# Patient Record
Sex: Male | Born: 1955
Health system: Southern US, Community
[De-identification: ages and names within clinical notes are randomized; demographics above are authoritative.]

## PROBLEM LIST (undated history)

## (undated) DIAGNOSIS — I5022 Chronic systolic (congestive) heart failure: Secondary | ICD-10-CM

## (undated) DIAGNOSIS — I422 Other hypertrophic cardiomyopathy: Secondary | ICD-10-CM

## (undated) DIAGNOSIS — I428 Other cardiomyopathies: Secondary | ICD-10-CM

## (undated) DIAGNOSIS — Z8042 Family history of malignant neoplasm of prostate: Secondary | ICD-10-CM

## (undated) DIAGNOSIS — I2510 Atherosclerotic heart disease of native coronary artery without angina pectoris: Secondary | ICD-10-CM

## (undated) DIAGNOSIS — Z803 Family history of malignant neoplasm of breast: Secondary | ICD-10-CM

## (undated) DIAGNOSIS — M109 Gout, unspecified: Secondary | ICD-10-CM

## (undated) DIAGNOSIS — M1990 Unspecified osteoarthritis, unspecified site: Secondary | ICD-10-CM

## (undated) DIAGNOSIS — I10 Essential (primary) hypertension: Secondary | ICD-10-CM

## (undated) HISTORY — DX: Chronic systolic (congestive) heart failure: I50.22

## (undated) HISTORY — DX: Family history of malignant neoplasm of breast: Z80.3

## (undated) HISTORY — DX: Other hypertrophic cardiomyopathy: I42.2

## (undated) HISTORY — DX: Atherosclerotic heart disease of native coronary artery without angina pectoris: I25.10

## (undated) HISTORY — PX: KNEE ARTHROSCOPY: SUR90

## (undated) HISTORY — DX: Other cardiomyopathies: I42.8

## (undated) HISTORY — DX: Essential (primary) hypertension: I10

## (undated) HISTORY — DX: Family history of malignant neoplasm of prostate: Z80.42

---

## 2001-05-09 ENCOUNTER — Ambulatory Visit (HOSPITAL_COMMUNITY): Admission: RE | Admit: 2001-05-09 | Discharge: 2001-05-09 | Payer: Self-pay | Admitting: Family Medicine

## 2001-05-09 ENCOUNTER — Encounter: Admission: RE | Admit: 2001-05-09 | Discharge: 2001-05-09 | Payer: Self-pay | Admitting: Family Medicine

## 2001-05-12 ENCOUNTER — Encounter: Admission: RE | Admit: 2001-05-12 | Discharge: 2001-05-12 | Payer: Self-pay | Admitting: Family Medicine

## 2001-05-23 ENCOUNTER — Encounter: Admission: RE | Admit: 2001-05-23 | Discharge: 2001-05-23 | Payer: Self-pay | Admitting: Sports Medicine

## 2001-06-09 ENCOUNTER — Ambulatory Visit (HOSPITAL_COMMUNITY): Admission: RE | Admit: 2001-06-09 | Discharge: 2001-06-09 | Payer: Self-pay | Admitting: Sports Medicine

## 2002-01-29 ENCOUNTER — Inpatient Hospital Stay (HOSPITAL_COMMUNITY): Admission: EM | Admit: 2002-01-29 | Discharge: 2002-02-01 | Payer: Self-pay | Admitting: Emergency Medicine

## 2002-01-30 ENCOUNTER — Encounter: Payer: Self-pay | Admitting: Family Medicine

## 2002-02-06 ENCOUNTER — Encounter: Admission: RE | Admit: 2002-02-06 | Discharge: 2002-02-06 | Payer: Self-pay | Admitting: Family Medicine

## 2002-12-21 ENCOUNTER — Encounter: Admission: RE | Admit: 2002-12-21 | Discharge: 2002-12-21 | Payer: Self-pay | Admitting: Family Medicine

## 2003-01-04 ENCOUNTER — Encounter: Admission: RE | Admit: 2003-01-04 | Discharge: 2003-01-04 | Payer: Self-pay | Admitting: Family Medicine

## 2003-01-08 ENCOUNTER — Encounter: Admission: RE | Admit: 2003-01-08 | Discharge: 2003-01-08 | Payer: Self-pay | Admitting: Family Medicine

## 2003-01-09 ENCOUNTER — Encounter: Admission: RE | Admit: 2003-01-09 | Discharge: 2003-01-09 | Payer: Self-pay | Admitting: Family Medicine

## 2003-01-28 ENCOUNTER — Encounter: Admission: RE | Admit: 2003-01-28 | Discharge: 2003-01-28 | Payer: Self-pay | Admitting: Sports Medicine

## 2003-12-30 ENCOUNTER — Emergency Department (HOSPITAL_COMMUNITY): Admission: EM | Admit: 2003-12-30 | Discharge: 2003-12-31 | Payer: Self-pay | Admitting: Emergency Medicine

## 2003-12-31 ENCOUNTER — Encounter: Admission: RE | Admit: 2003-12-31 | Discharge: 2003-12-31 | Payer: Self-pay | Admitting: Family Medicine

## 2003-12-31 ENCOUNTER — Encounter: Admission: RE | Admit: 2003-12-31 | Discharge: 2003-12-31 | Payer: Self-pay | Admitting: Sports Medicine

## 2004-12-02 ENCOUNTER — Ambulatory Visit: Payer: Self-pay | Admitting: Family Medicine

## 2007-01-26 DIAGNOSIS — I10 Essential (primary) hypertension: Secondary | ICD-10-CM | POA: Insufficient documentation

## 2007-01-26 DIAGNOSIS — K922 Gastrointestinal hemorrhage, unspecified: Secondary | ICD-10-CM | POA: Insufficient documentation

## 2009-07-26 ENCOUNTER — Encounter: Payer: Self-pay | Admitting: Emergency Medicine

## 2009-07-27 ENCOUNTER — Ambulatory Visit: Payer: Self-pay | Admitting: Cardiology

## 2009-07-27 ENCOUNTER — Inpatient Hospital Stay (HOSPITAL_COMMUNITY): Admission: EM | Admit: 2009-07-27 | Discharge: 2009-07-29 | Payer: Self-pay | Admitting: Cardiology

## 2009-07-28 ENCOUNTER — Encounter: Payer: Self-pay | Admitting: Cardiology

## 2009-08-01 ENCOUNTER — Ambulatory Visit: Payer: Self-pay | Admitting: Cardiology

## 2009-08-04 LAB — CONVERTED CEMR LAB
BUN: 37 mg/dL — ABNORMAL HIGH (ref 6–23)
CO2: 30 meq/L (ref 19–32)
Calcium: 9.5 mg/dL (ref 8.4–10.5)
Chloride: 105 meq/L (ref 96–112)
Creatinine, Ser: 1.5 mg/dL (ref 0.4–1.5)
GFR calc non Af Amer: 62.69 mL/min (ref >60)
Glucose, Bld: 86 mg/dL (ref 70–99)
Potassium: 4.4 meq/L (ref 3.5–5.1)
Sodium: 141 meq/L (ref 135–145)

## 2009-08-20 ENCOUNTER — Ambulatory Visit: Payer: Self-pay | Admitting: Cardiology

## 2009-08-20 DIAGNOSIS — I428 Other cardiomyopathies: Secondary | ICD-10-CM | POA: Insufficient documentation

## 2009-08-25 ENCOUNTER — Encounter: Payer: Self-pay | Admitting: Cardiology

## 2009-09-11 ENCOUNTER — Telehealth: Payer: Self-pay | Admitting: Cardiology

## 2009-09-11 ENCOUNTER — Telehealth (INDEPENDENT_AMBULATORY_CARE_PROVIDER_SITE_OTHER): Payer: Self-pay | Admitting: *Deleted

## 2010-01-22 ENCOUNTER — Encounter (INDEPENDENT_AMBULATORY_CARE_PROVIDER_SITE_OTHER): Payer: Self-pay | Admitting: *Deleted

## 2010-04-14 ENCOUNTER — Ambulatory Visit: Payer: Self-pay | Admitting: Cardiology

## 2010-04-24 ENCOUNTER — Ambulatory Visit: Payer: Self-pay | Admitting: Cardiology

## 2010-04-27 LAB — CONVERTED CEMR LAB
BUN: 32 mg/dL — ABNORMAL HIGH (ref 6–23)
CO2: 30 meq/L (ref 19–32)
Calcium: 9.5 mg/dL (ref 8.4–10.5)
Chloride: 102 meq/L (ref 96–112)
Creatinine, Ser: 1.5 mg/dL (ref 0.4–1.5)
GFR calc non Af Amer: 50.5 mL/min (ref >60)
Glucose, Bld: 108 mg/dL — ABNORMAL HIGH (ref 70–99)
Potassium: 3.7 meq/L (ref 3.5–5.1)
Pro B Natriuretic peptide (BNP): 87.4 pg/mL (ref 0.0–100.0)
Sodium: 140 meq/L (ref 135–145)

## 2010-06-15 ENCOUNTER — Ambulatory Visit: Payer: Self-pay | Admitting: Cardiology

## 2010-06-19 ENCOUNTER — Telehealth: Payer: Self-pay | Admitting: Cardiology

## 2010-10-14 ENCOUNTER — Ambulatory Visit: Payer: Self-pay | Admitting: Cardiology

## 2010-10-14 DIAGNOSIS — E669 Obesity, unspecified: Secondary | ICD-10-CM | POA: Insufficient documentation

## 2010-12-29 NOTE — Assessment & Plan Note (Signed)
Summary: per check out/sf   Visit Type:  Follow-up Primary Provider:  None  CC:  Cardiomyopathy.  History of Present Illness: The patient presents for followup his known cardiomyopathy. At the last appointment I increased his carvedilol to 37.5 mg b.i.d. He did well with this. He denies any lightheadedness, presyncope or syncope. He has no new shortness of breath, PND or orthopnea. He has no chest discomfort, neck or arm discomfort. He did exercise a little since I last saw him but he has not lost any weight as I directed.  Current Medications (verified): 1)  Aspirin 81 Mg  Tabs (Aspirin) .... Daily 2)  Furosemide 40 Mg Tabs (Furosemide) .... Daily 3)  Norvasc 10 Mg Tabs (Amlodipine Besylate) .Marland Kitchen.. 1 By Mouth Daily 4)  Enalapril Maleate 10 Mg Tabs (Enalapril Maleate) .... One Two Times A Day 5)  Carvedilol 25 Mg Tabs (Carvedilol) .Marland Kitchen.. 1 Tab Twice Daily 6)  Carvedilol 12.5 Mg Tabs (Carvedilol) .... One Tablet Two Times A Day With 25 Mg Tablets Two Times A Day For A Total of 37.5 Mg Bid  Allergies (verified): No Known Drug Allergies  Past History:  Past Medical History: Reviewed history from 08/18/2009 and no changes required. Hypertensive urgency  NQWMI Non Obstructive CAD Cardiomyopathy (EF 25%)  Past Surgical History: Reviewed history from 01/26/2007 and no changes required. CT head negative 3/03 - 02/01/2002  Review of Systems       As stated in the HPI and negative for all other systems.   Vital Signs:  Patient profile:   55 year old male Height:      72 inches Weight:      263 pounds BMI:     35.80 Pulse rate:   67 / minute Resp:     18 per minute BP sitting:   138 / 88  (right arm)  Vitals Entered By: Marrion Coy, CNA (October 14, 2010 10:02 AM)  Physical Exam  General:  Well developed, well nourished, in no acute distress. Head:  normocephalic and atraumatic Eyes:  PERRLA/EOM intact; conjunctiva and lids normal. Mouth:  Teeth, gums and palate normal. Oral  mucosa normal. Neck:  Neck supple, no JVD. No masses, thyromegaly or abnormal cervical nodes. Chest Wall:  no deformities or breast masses noted Lungs:  Clear bilaterally to auscultation and percussion. Heart:  Non-displaced PMI, chest non-tender; regular rate and rhythm, S1, S2 without murmurs, rubs or gallops. Carotid upstroke normal, no bruit. Normal abdominal aortic size, no bruits. Femorals normal pulses, no bruits. Pedals normal pulses. No edema, no varicosities. Abdomen:  Bowel sounds positive; abdomen soft and non-tender without masses, organomegaly, or hernias noted. No hepatosplenomegaly, obese Msk:  Back normal, normal gait. Muscle strength and tone normal. Extremities:  No clubbing or cyanosis. Neurologic:  Alert and oriented x 3. Skin:  Intact without lesions or rashes. Cervical Nodes:  no significant adenopathy Inguinal Nodes:  no significant adenopathy Psych:  Normal affect.   EKG  Procedure date:  10/14/2010  Findings:      Sinus rhythm, left ventricular hypertrophy by voltage criteria  Impression & Recommendations:  Problem # 1:  CARDIOMYOPATHY (ICD-425.4)  Today I will increase his carvedilol to 50 mg b.i.d. He will continue the other meds as listed. When he comes back in 4 months we will repeat an echocardiogram.  Orders: Echocardiogram (Echo)  Problem # 2:  HYPERTENSION, BENIGN SYSTEMIC (ICD-401.1)  His blood pressure is now controlled in the context of treating his cardiomyopathy.  His updated medication list for  this problem includes:    Aspirin 81 Mg Tabs (Aspirin) .Marland Kitchen... Daily    Furosemide 40 Mg Tabs (Furosemide) .Marland Kitchen... Daily    Norvasc 10 Mg Tabs (Amlodipine besylate) .Marland Kitchen... 1 by mouth daily    Enalapril Maleate 10 Mg Tabs (Enalapril maleate) ..... One two times a day    Carvedilol 25 Mg Tabs (Carvedilol) .Marland Kitchen... 1 tab twice daily    Carvedilol 12.5 Mg Tabs (Carvedilol) ..... One tablet two times a day with 25 mg tablets two times a day for a total of  37.5 mg bid    Carvedilol 25 Mg Tabs (Carvedilol) .Marland Kitchen... Take 2 tablets twice a day  Problem # 3:  OBESITY, UNSPECIFIED (ICD-278.00) We had another discussion about weight loss with diet and exercise.  Patient Instructions: 1)  Your physician recommends that you schedule a follow-up appointment in: 4 MONTHS WITH DR. Amairani Shuey 2)  Your physician has recommended you make the following change in your medication:  3)  Your physician has requested that you have an echocardiogram.  Echocardiography is a painless test that uses sound waves to create images of your heart. It provides your doctor with information about the size and shape of your heart and how well your heart's chambers and valves are working.  This procedure takes approximately one hour. There are no restrictions for this procedure.  ECHO SAME DAY AS APPT WITH DR. Isiaha Greenup 4)  Your physician encouraged you to lose weight for better health. Prescriptions: CARVEDILOL 25 MG TABS (CARVEDILOL) Take 2 tablets twice a day  #120 x 6   Entered by:   Lisabeth Devoid RN   Authorized by:   Rollene Rotunda, MD, [facility]   Signed by:   Lisabeth Devoid RN on 10/14/2010   Method used:   Electronically to        [id number] (retail)       [address] [place], [place]  [address]       Ph: [phone]       Fax: [phone]   RxID:   [id number]  I have reviewed and approved all prescriptions at the time of this visit. Rollene Rotunda, MD, [facility]  October 14, 2010 12:09 PM

## 2010-12-29 NOTE — Assessment & Plan Note (Signed)
Summary: EPH/ GD   Visit Type:  Follow-up  CC:  Cardiomyopathy.  History of Present Illness: The patient presents for hospital followup after treatment of a cardiomyopathy. He presented with heart failure. He was found to have an ejection fraction of 20-25% with global hypokinesis. He subsequently had a cardiac catheterization demonstrating only minimal plaque. It was felt that his heart disease was related to hypertension. This has been managed with the meds as listed.  He says that since discharge he has felt much better. He has had none of the dyspnea that he was having previously. He is not describing PND or orthopnea. He is having some fleeting atypical chest discomfort but no chest pressure, neck or arm discomfort. He has had no swelling or weight gain.  Current Medications (verified): 1)  Aspirin 81 Mg  Tabs (Aspirin) .... Daily 2)  Furosemide 40 Mg Tabs (Furosemide) .... Daily 3)  Norvasc 10 Mg Tabs (Amlodipine Besylate) .... Daily 4)  Labetalol Hcl 200 Mg Tabs (Labetalol Hcl) .... 2 Three Times A Day 5)  Enalapril Maleate 5 Mg Tabs (Enalapril Maleate) .... Two Times A Day  Allergies (verified): No Known Drug Allergies  Past History:  Past Medical History: Reviewed history from 08/18/2009 and no changes required. Hypertensive urgency  NQWMI Non Obstructive CAD Cardiomyopathy (EF 25%)  Past Surgical History: Reviewed history from 01/26/2007 and no changes required. CT head negative 3/03 - 02/01/2002  Review of Systems       As stated in the HPI and negative for all other systems.   Vital Signs:  Patient profile:   55 year old male Height:      72 inches Weight:      265 pounds BMI:     36.07 Pulse rate:   72 / minute Resp:     18 per minute BP sitting:   138 / 80  (right arm)  Vitals Entered By: Marrion Coy, CNA (August 20, 2009 10:17 AM)  Nutrition Counseling: Patient's BMI is greater than 25 and therefore counseled on weight management  options.  Physical Exam  General:  Well developed, well nourished, in no acute distress. Head:  normocephalic and atraumatic Eyes:  PERRLA/EOM intact; conjunctiva and lids normal. Mouth:  Teeth, gums and palate normal. Oral mucosa normal. Neck:  Neck supple, no JVD. No masses, thyromegaly or abnormal cervical nodes. Chest Wall:  no deformities or breast masses noted Lungs:  Clear bilaterally to auscultation and percussion. Abdomen:  Bowel sounds positive; abdomen soft and non-tender without masses, organomegaly, or hernias noted. No hepatosplenomegaly, obese Msk:  Back normal, normal gait. Muscle strength and tone normal. Extremities:  No clubbing or cyanosis. Neurologic:  Alert and oriented x 3. Skin:  Intact without lesions or rashes. Psych:  Normal affect.   Detailed Cardiovascular Exam  Neck    Carotids: Carotids full and equal bilaterally without bruits.      Neck Veins: Normal, no JVD.    Heart    Inspection: no deformities or lifts noted.      Palpation: normal PMI with no thrills palpable.      Auscultation: regular rate and rhythm, S1, S2 without murmurs, rubs, gallops, or clicks.    Vascular    Abdominal Aorta: no palpable masses, pulsations, or audible bruits.      Femoral Pulses: normal femoral pulses bilaterally.      Pedal Pulses: normal pedal pulses bilaterally.      Radial Pulses: normal radial pulses bilaterally.      Peripheral Circulation:  no clubbing, cyanosis, or edema noted with normal capillary refill.     EKG  Procedure date:  08/20/2009  Findings:      normal sinus rhythm, rate 64, right axis deviation, no old EKGs for comparison, mild interventricular conduction delay with repolarization changes.  Impression & Recommendations:  Problem # 1:  CARDIOMYOPATHY (ICD-425.4)  The patient has a nonischemic cardiomyopathy. This is related to his hypertension. He seems to have class I symptoms at this point. We will continue to manage this in the  context of treating his hypertension.  The following medications were removed from the medication list:    Labetalol Hcl 200 Mg Tabs (Labetalol hcl) .Marland Kitchen... 2 three times a day His updated medication list for this problem includes:    Aspirin 81 Mg Tabs (Aspirin) .Marland Kitchen... Daily    Furosemide 40 Mg Tabs (Furosemide) .Marland Kitchen... Daily    Norvasc 10 Mg Tabs (Amlodipine besylate) .Marland Kitchen... Daily    Enalapril Maleate 5 Mg Tabs (Enalapril maleate) .Marland Kitchen..Marland Kitchen Two times a day    Carvedilol 25 Mg Tabs (Carvedilol) .Marland Kitchen... 1 tab twice daily  Problem # 2:  HYPERTENSION, BENIGN SYSTEMIC (ICD-401.1) His blood pressure is well controlled but he occasionally forgets his labetalol. I will switch him to carvedilol 25 mg b.i.d. and continue the other medications as listed. I may titrate his ACE inhibitor further over time. Also his target beta blocker dose is 50 b.i.d. with his size.  Other Orders: EKG w/ Interpretation (93000)  Patient Instructions: 1)  Your physician recommends that you schedule a follow-up appointment in: 3 MONTHS 2)  Your physician has recommended you make the following change in your medication: START CARVEDILOL 25 MG TWICE DAILY AFTER LABATEOL RUNS OUT

## 2010-12-29 NOTE — Progress Notes (Signed)
Summary: refill meds  Phone Note Refill Request Call back at Home Phone [phone] Message from:  Patient on June 19, 2010 4:34 PM  Refills Requested: Medication #1:  CARVEDILOL 12.5 MG TABS one tablet two times a day with 25 mg tablets two times a day for a total of 37.5 mg bid. [facility] [address] .   Method Requested: Fax to Local Pharmacy Initial call taken by: Lorne Skeens,  June 19, 2010 4:35 PM  Follow-up for Phone Call        Rx sent into pharmacy. Pt notified. Marrion Coy, CNA  June 22, 2010 9:27 AM  Follow-up by: Marrion Coy, CNA,  June 22, 2010 9:28 AM    Prescriptions: CARVEDILOL 12.5 MG TABS (CARVEDILOL) one tablet two times a day with 25 mg tablets two times a day for a total of 37.5 mg bid  #60 x 6   Entered by:   Marrion Coy, CNA   Authorized by:   Rollene Rotunda, MD, [facility]   Signed by:   Marrion Coy, CNA on 06/22/2010   Method used:   Electronically to        [id number] (retail)       [address] [place], [place]  [address]       Ph: [phone]       Fax: [phone]   RxID:   [id number]

## 2010-12-29 NOTE — Assessment & Plan Note (Signed)
Summary: per check out/sf   Visit Type:  Follow-up Primary Provider:  None  CC:  Cardiomyopathy and HTN.  History of Present Illness: The patient presents for evaluation of the above. Since I last saw him he has had no new cardiovascular complaints. He tolerated increase of his enalapril. He has no shortness of breath, PND or orthopnea. He has no chest pressure, neck or arm discomfort. He is not having any palpitations, presyncope or syncope.  Current Medications (verified): 1)  Aspirin 81 Mg  Tabs (Aspirin) .... Daily 2)  Furosemide 40 Mg Tabs (Furosemide) .... Daily 3)  Norvasc 10 Mg Tabs (Amlodipine Besylate) .Marland Kitchen.. 1 By Mouth Daily 4)  Enalapril Maleate 10 Mg Tabs (Enalapril Maleate) .... One Two Times A Day 5)  Carvedilol 25 Mg Tabs (Carvedilol) .Marland Kitchen.. 1 Tab Twice Daily  Allergies (verified): No Known Drug Allergies  Past History:  Past Medical History: Reviewed history from 08/18/2009 and no changes required. Hypertensive urgency  NQWMI Non Obstructive CAD Cardiomyopathy (EF 25%)  Review of Systems       As stated in the HPI and negative for all other systems.   Vital Signs:  Patient profile:   55 year old male Height:      72 inches Weight:      260 pounds BMI:     35.39 Pulse rate:   73 / minute Resp:     18 per minute BP sitting:   157 / 95  (left arm)  Vitals Entered By: Marrion Coy, CNA (June 15, 2010 9:35 AM)  Physical Exam  General:  Well developed, well nourished, in no acute distress. Head:  normocephalic and atraumatic Eyes:  PERRLA/EOM intact; conjunctiva and lids normal. Mouth:  Teeth, gums and palate normal. Oral mucosa normal. Neck:  Neck supple, no JVD. No masses, thyromegaly or abnormal cervical nodes. Chest Wall:  no deformities or breast masses noted Lungs:  Clear bilaterally to auscultation and percussion. Heart:  Non-displaced PMI, chest non-tender; regular rate and rhythm, S1, S2 without murmurs, rubs or gallops. Carotid upstroke normal,  no bruit. Normal abdominal aortic size, no bruits. Femorals normal pulses, no bruits. Pedals normal pulses. No edema, no varicosities. Abdomen:  Bowel sounds positive; abdomen soft and non-tender without masses, organomegaly, or hernias noted. No hepatosplenomegaly. Msk:  Back normal, normal gait. Muscle strength and tone normal. Pulses:  pulses normal in all 4 extremities Extremities:  No clubbing or cyanosis. Neurologic:  Alert and oriented x 3. Skin:  Intact without lesions or rashes. Cervical Nodes:  no significant adenopathy Axillary Nodes:  no significant adenopathy Inguinal Nodes:  no significant adenopathy Psych:  Normal affect.   Impression & Recommendations:  Problem # 1:  CARDIOMYOPATHY (ICD-425.4) The patient tolerated ACE inhibitor increased. Today all increase his carvedilol to 37.5 mg b.i.d. with a goal of 50 b.i.d.  Problem # 2:  HYPERTENSION, BENIGN SYSTEMIC (ICD-401.1) His blood pressure will be treated in the context of his cardiomyopathy management.  Patient Instructions: 1)  Your physician recommends that you schedule a follow-up appointment in: 4 to 6 weeks with Dr Antoine Poche 2)  Your physician has recommended you make the following change in your medication: Add Carvedilol 12.5 mg one twice a day for a total of 37.5 mg twice a day 3)  You have been diagnosed with Congestive Heart Failure or CHF.  CHF is a condition in which a problem with the structure or function of the heart impairs its ability to supply sufficient blood flow to meet the  body's needs.  For further information please visit www.cardiosmart.org for detailed information on CHF. 4)  Your physician recommends that you weigh, daily, at the same time every day, and in the same amount of clothing.  Please record your daily weights on the handout provided and bring it to your next appointment. Prescriptions: CARVEDILOL 12.5 MG TABS (CARVEDILOL) one tablet two times a day with 25 mg tablets two times a day for  a total of 37.5 mg bid  #60 x 2   Entered by:   Charolotte Capuchin, RN   Authorized by:   Rollene Rotunda, MD, [facility] [place] [facility]   Signed by:   Charolotte Capuchin, RN on 06/15/2010   Method used:   Electronically to        [facility] [id number] (retail)       [address] [place], [place]  [address]       Ph: [phone]       Fax: [phone]   RxID:   [id number]

## 2010-12-29 NOTE — Letter (Signed)
Summary: Appointment - Missed  [facility]     [place], [place]    Phone:   Fax:      January 22, 2010 MRN: [id number]   [facility] [address] [place], [place]  [address]   Dear Craig Prince,  Our records indicate you missed your appointment on 01-22-2010  with  Dr.   Luray Lions   It is very important that we reach you to reschedule this appointment. We look forward to participating in your health care needs. Please contact us at the number listed above at your earliest convenience to reschedule this appointment.     Sincerely,      Lorne Skeens  [facility] Scheduling Team

## 2010-12-29 NOTE — Progress Notes (Signed)
Summary: WANTS A LETTER FOR HIS ATTY  Phone Note Call from Patient Call back at Home Phone [phone]   Caller: Patient Summary of Call: pt have question about a med Initial call taken by: Judie Grieve,  September 11, 2009 11:17 AM  Follow-up for Phone Call        Called patient and left message on machine Pam Fleming-Hayes,RN  PT WALKED IN STATING HE NEEDS A NOTE FROM DR [facility] FOR HIS ATTY.  WILL HAVE DR [facility] REVIEW INFORMATION AND DICTATE A LETTER IF POSSIBLE.  PAM FLEMING-HAYES,RN  Additional Follow-up for Phone Call Additional follow up Details #1::        I cannot write a letter stating that his NQWMI or HTN contributed to his traffic ticket. per Dr Rollene Rotunda.  pt aware Additional Follow-up by: Charolotte Capuchin, RN,  September 19, 2009 12:46 PM

## 2010-12-29 NOTE — Assessment & Plan Note (Signed)
Summary: PER WALK IN/SAF   Visit Type:  Follow-up Primary Provider:  None  CC:  HTN/Cardiomyopathy.  History of Present Illness: The patient presents for followup. His only complaint is fatigue. However, he works at night which may explain part of that. He is not describing shortness of breath, PND or orthopnea. He is not describing palpitations, presyncope or syncope. He is not having any chest pressure, neck or arm discomfort. He has had no weight gain or swelling. He is not avoiding salt or following any particular diet. He doesn't exercise as much as I would help.  Current Medications (verified): 1)  Aspirin 81 Mg  Tabs (Aspirin) .... Daily 2)  Furosemide 40 Mg Tabs (Furosemide) .... Daily 3)  Norvasc 10 Mg Tabs (Amlodipine Besylate) .... Daily 4)  Enalapril Maleate 5 Mg Tabs (Enalapril Maleate) .... Two Times A Day 5)  Carvedilol 25 Mg Tabs (Carvedilol) .Marland Kitchen.. 1 Tab Twice Daily  Allergies (verified): No Known Drug Allergies  Past History:  Past Medical History: Reviewed history from 08/18/2009 and no changes required. Hypertensive urgency  NQWMI Non Obstructive CAD Cardiomyopathy (EF 25%)  Review of Systems       As stated in the HPI and negative for all other systems.   Vital Signs:  Patient profile:   55 year old male Height:      72 inches Weight:      264 pounds BMI:     35.93 Pulse rate:   71 / minute Resp:     16 per minute BP sitting:   158 / 108  (right arm)  Vitals Entered By: Marrion Coy, CNA (Apr 14, 2010 10:17 AM)  Physical Exam  General:  Well developed, well nourished, in no acute distress. Head:  normocephalic and atraumatic Eyes:  PERRLA/EOM intact; conjunctiva and lids normal. Mouth:  Teeth, gums and palate normal. Oral mucosa normal. Neck:  Neck supple, no JVD. No masses, thyromegaly or abnormal cervical nodes. Chest Wall:  no deformities or breast masses noted Lungs:  Clear bilaterally to auscultation and percussion. Heart:  Sand S2  within normal limits, no S3, positive S4, no clicks, no rubs, no murmurs. Abdomen:  Bowel sounds positive; abdomen soft and non-tender without masses, organomegaly, or hernias noted. No hepatosplenomegaly. Msk:  Back normal, normal gait. Muscle strength and tone normal. Pulses:  pulses normal in all 4 extremities Extremities:  No clubbing or cyanosis. Neurologic:  Alert and oriented x 3. Skin:  Intact without lesions or rashes. Psych:  Normal affect.   EKG  Procedure date:  04/14/2010  Findings:      sus rhythm, rate 73, left axis deviation, left ventricular hypertrophy, lateral T-wave inversions consistent with repolarization  Impression & Recommendations:  Problem # 1:  CARDIOMYOPATHY (ICD-425.4) I educated him about salt restriction. Today I will titrate his enalapril to 10 mg b.i.d. He will come back in 2 weeks for a basic metabolic profile. Orders: EKG w/ Interpretation (93000)  Problem # 2:  HYPERTENSION, BENIGN SYSTEMIC (ICD-401.1) This will be managed in the context of managing his cardiomyopathy. Orders: EKG w/ Interpretation (93000)  Patient Instructions: 1)  Your physician recommends that you schedule a follow-up appointment in: 2 months with Dr Antoine Poche 2)  Your physician recommends that you return for lab work in:  in 10 days BNP and BMP 401.1 428.22 v58.69 3)  Your physician has recommended you make the following change in your medication:  Increase Enalapril to 10mg  twice a day. 4)  You have been diagnosed with  Congestive Heart Failure or CHF.  CHF is a condition in which a problem with the structure or function of the heart impairs its ability to supply sufficient blood flow to meet the body's needs.  For further information please visit www.cardiosmart.org for detailed information on CHF. 5)  Your physician recommends that you weigh, daily, at the same time every day, and in the same amount of clothing.  Please record your daily weights on the handout provided and  bring it to your next appointment. Prescriptions: ENALAPRIL MALEATE 10 MG TABS (ENALAPRIL MALEATE) one two times a day  #60 x 11   Entered by:   Charolotte Capuchin, RN   Authorized by:   Rollene Rotunda, MD, [facility]   Signed by:   Charolotte Capuchin, RN on 04/14/2010   Method used:   Electronically to        [facility] [phone]* (retail)       [address] [place], [place]  [address]       Ph: [phone]       Fax: [phone]   RxID:   [id number]

## 2010-12-29 NOTE — Progress Notes (Signed)
  Walk in Patient Form Recieved "patient needs note form Dr.Hochrein"  will forward to PAm  [facility]  September 11, 2009 1:45 PM

## 2011-01-01 NOTE — Letter (Signed)
Summary: Ticket Violation/Receipt Note  Ticket Violation/Receipt Note   Imported By: Roderic Ovens 10/29/2009 15:50:19  _____________________________________________________________________  External Attachment:    Type:   Image     Comment:   External Document

## 2011-02-11 ENCOUNTER — Ambulatory Visit: Payer: Self-pay | Admitting: Cardiology

## 2011-02-11 ENCOUNTER — Other Ambulatory Visit (HOSPITAL_COMMUNITY): Payer: Self-pay

## 2011-03-06 LAB — CBC
HCT: 40.6 % (ref 39.0–52.0)
HCT: 40.6 % (ref 39.0–52.0)
HCT: 41.9 % (ref 39.0–52.0)
HCT: 41.4 % (ref 39.0–52.0)
Hemoglobin: 12.9 g/dL — ABNORMAL LOW (ref 13.0–17.0)
Hemoglobin: 13 g/dL (ref 13.0–17.0)
Hemoglobin: 13.3 g/dL (ref 13.0–17.0)
Hemoglobin: 13.1 g/dL (ref 13.0–17.0)
MCHC: 31.6 g/dL (ref 30.0–36.0)
MCHC: 31.7 g/dL (ref 30.0–36.0)
MCHC: 32 g/dL (ref 30.0–36.0)
MCHC: 31.7 g/dL (ref 30.0–36.0)
MCV: 70.8 fL — ABNORMAL LOW (ref 78.0–100.0)
MCV: 71.3 fL — ABNORMAL LOW (ref 78.0–100.0)
MCV: 71.4 fL — ABNORMAL LOW (ref 78.0–100.0)
MCV: 71.1 fL — ABNORMAL LOW (ref 78.0–100.0)
Platelets: 206 10*3/uL (ref 150–400)
Platelets: 219 10*3/uL (ref 150–400)
Platelets: 222 10*3/uL (ref 150–400)
Platelets: 211 10*3/uL (ref 150–400)
RBC: 5.69 MIL/uL (ref 4.22–5.81)
RBC: 5.73 MIL/uL (ref 4.22–5.81)
RBC: 5.87 MIL/uL — ABNORMAL HIGH (ref 4.22–5.81)
RBC: 5.82 MIL/uL — ABNORMAL HIGH (ref 4.22–5.81)
RDW: 17.4 % — ABNORMAL HIGH (ref 11.5–15.5)
RDW: 17.8 % — ABNORMAL HIGH (ref 11.5–15.5)
RDW: 18.4 % — ABNORMAL HIGH (ref 11.5–15.5)
RDW: 18.3 % — ABNORMAL HIGH (ref 11.5–15.5)
WBC: 8.7 10*3/uL (ref 4.0–10.5)
WBC: 9.1 10*3/uL (ref 4.0–10.5)
WBC: 9.3 10*3/uL (ref 4.0–10.5)
WBC: 8.4 10*3/uL (ref 4.0–10.5)

## 2011-03-06 LAB — POCT I-STAT 3, ART BLOOD GAS (G3+)
Acid-Base Excess: 2 mmol/L (ref 0.0–2.0)
Acid-Base Excess: 4 mmol/L — ABNORMAL HIGH (ref 0.0–2.0)
Bicarbonate: 28.1 mEq/L — ABNORMAL HIGH (ref 20.0–24.0)
Bicarbonate: 28.6 mEq/L — ABNORMAL HIGH (ref 20.0–24.0)
O2 Saturation: 59 %
O2 Saturation: 95 %
TCO2: 30 mmol/L (ref 0–100)
TCO2: 30 mmol/L (ref 0–100)
pCO2 arterial: 41.7 mmHg (ref 35.0–45.0)
pCO2 arterial: 47 mmHg — ABNORMAL HIGH (ref 35.0–45.0)
pH, Arterial: 7.385 (ref 7.350–7.450)
pH, Arterial: 7.444 (ref 7.350–7.450)
pO2, Arterial: 31 mmHg — CL (ref 80.0–100.0)
pO2, Arterial: 72 mmHg — ABNORMAL LOW (ref 80.0–100.0)

## 2011-03-06 LAB — DIFFERENTIAL
Basophils Absolute: 0 10*3/uL (ref 0.0–0.1)
Basophils Relative: 1 % (ref 0–1)
Eosinophils Absolute: 0.2 10*3/uL (ref 0.0–0.7)
Eosinophils Relative: 2 % (ref 0–5)
Lymphocytes Relative: 18 % (ref 12–46)
Lymphs Abs: 1.5 10*3/uL (ref 0.7–4.0)
Monocytes Absolute: 0.5 10*3/uL (ref 0.1–1.0)
Monocytes Relative: 6 % (ref 3–12)
Neutro Abs: 6.1 10*3/uL (ref 1.7–7.7)
Neutrophils Relative %: 74 % (ref 43–77)

## 2011-03-06 LAB — CARDIAC PANEL(CRET KIN+CKTOT+MB+TROPI)
CK, MB: 12.5 ng/mL — ABNORMAL HIGH (ref 0.3–4.0)
CK, MB: 16.5 ng/mL — ABNORMAL HIGH (ref 0.3–4.0)
CK, MB: 18.9 ng/mL — ABNORMAL HIGH (ref 0.3–4.0)
Relative Index: 3.7 — ABNORMAL HIGH (ref 0.0–2.5)
Relative Index: 4 — ABNORMAL HIGH (ref 0.0–2.5)
Relative Index: 4.2 — ABNORMAL HIGH (ref 0.0–2.5)
Total CK: 340 U/L — ABNORMAL HIGH (ref 7–232)
Total CK: 411 U/L — ABNORMAL HIGH (ref 7–232)
Total CK: 446 U/L — ABNORMAL HIGH (ref 7–232)
Troponin I: 0.09 ng/mL — ABNORMAL HIGH (ref 0.00–0.06)
Troponin I: 0.13 ng/mL — ABNORMAL HIGH (ref 0.00–0.06)
Troponin I: 0.17 ng/mL — ABNORMAL HIGH (ref 0.00–0.06)

## 2011-03-06 LAB — URINALYSIS, ROUTINE W REFLEX MICROSCOPIC
Bilirubin Urine: NEGATIVE
Glucose, UA: NEGATIVE mg/dL
Ketones, ur: NEGATIVE mg/dL
Leukocytes, UA: NEGATIVE
Nitrite: NEGATIVE
Protein, ur: >300 mg/dL — AB
Specific Gravity, Urine: 1.025 (ref 1.005–1.030)
Urobilinogen, UA: 0.2 mg/dL (ref 0.0–1.0)
pH: 5.5 (ref 5.0–8.0)

## 2011-03-06 LAB — PLATELET COUNT: Platelets: 233 10*3/uL (ref 150–400)

## 2011-03-06 LAB — BASIC METABOLIC PANEL
BUN: 21 mg/dL (ref 6–23)
BUN: 21 mg/dL (ref 6–23)
BUN: 21 mg/dL (ref 6–23)
BUN: 21 mg/dL (ref 6–23)
BUN: 21 mg/dL (ref 6–23)
CO2: 29 mEq/L (ref 19–32)
CO2: 30 mEq/L (ref 19–32)
CO2: 30 mEq/L (ref 19–32)
CO2: 31 mEq/L (ref 19–32)
CO2: 29 mEq/L (ref 19–32)
Calcium: 8.8 mg/dL (ref 8.4–10.5)
Calcium: 8.9 mg/dL (ref 8.4–10.5)
Calcium: 9 mg/dL (ref 8.4–10.5)
Calcium: 9.2 mg/dL (ref 8.4–10.5)
Calcium: 9 mg/dL (ref 8.4–10.5)
Chloride: 100 mEq/L (ref 96–112)
Chloride: 103 mEq/L (ref 96–112)
Chloride: 98 mEq/L (ref 96–112)
Chloride: 99 mEq/L (ref 96–112)
Chloride: 101 mEq/L (ref 96–112)
Creatinine, Ser: 1.46 mg/dL (ref 0.4–1.5)
Creatinine, Ser: 1.48 mg/dL (ref 0.4–1.5)
Creatinine, Ser: 1.59 mg/dL — ABNORMAL HIGH (ref 0.4–1.5)
Creatinine, Ser: 1.69 mg/dL — ABNORMAL HIGH (ref 0.4–1.5)
Creatinine, Ser: 1.49 mg/dL (ref 0.4–1.5)
GFR calc Af Amer: 51 mL/min — ABNORMAL LOW (ref >60)
GFR calc Af Amer: 55 mL/min — ABNORMAL LOW (ref >60)
GFR calc Af Amer: 60 mL/min — ABNORMAL LOW (ref >60)
GFR calc Af Amer: >60 mL/min (ref >60)
GFR calc Af Amer: 59 mL/min — ABNORMAL LOW (ref >60)
GFR calc non Af Amer: 43 mL/min — ABNORMAL LOW (ref >60)
GFR calc non Af Amer: 46 mL/min — ABNORMAL LOW (ref >60)
GFR calc non Af Amer: 50 mL/min — ABNORMAL LOW (ref >60)
GFR calc non Af Amer: 50 mL/min — ABNORMAL LOW (ref >60)
GFR calc non Af Amer: 49 mL/min — ABNORMAL LOW (ref >60)
Glucose, Bld: 103 mg/dL — ABNORMAL HIGH (ref 70–99)
Glucose, Bld: 104 mg/dL — ABNORMAL HIGH (ref 70–99)
Glucose, Bld: 110 mg/dL — ABNORMAL HIGH (ref 70–99)
Glucose, Bld: 112 mg/dL — ABNORMAL HIGH (ref 70–99)
Glucose, Bld: 117 mg/dL — ABNORMAL HIGH (ref 70–99)
Potassium: 3.3 mEq/L — ABNORMAL LOW (ref 3.5–5.1)
Potassium: 3.5 mEq/L (ref 3.5–5.1)
Potassium: 3.5 mEq/L (ref 3.5–5.1)
Potassium: 4.1 mEq/L (ref 3.5–5.1)
Potassium: 3.2 mEq/L — ABNORMAL LOW (ref 3.5–5.1)
Sodium: 136 mEq/L (ref 135–145)
Sodium: 138 mEq/L (ref 135–145)
Sodium: 139 mEq/L (ref 135–145)
Sodium: 141 mEq/L (ref 135–145)
Sodium: 138 mEq/L (ref 135–145)

## 2011-03-06 LAB — URINE MICROSCOPIC-ADD ON

## 2011-03-06 LAB — PROTIME-INR
INR: 1.1 (ref 0.00–1.49)
INR: 1.1 (ref 0.00–1.49)
Prothrombin Time: 13.8 seconds (ref 11.6–15.2)
Prothrombin Time: 14.1 seconds (ref 11.6–15.2)

## 2011-03-06 LAB — POCT CARDIAC MARKERS
CKMB, poc: 17.9 ng/mL (ref 1.0–8.0)
Myoglobin, poc: >500 ng/mL (ref 12–200)
Troponin i, poc: <0.05 ng/mL (ref 0.00–0.09)

## 2011-03-06 LAB — LIPID PANEL
Cholesterol: 193 mg/dL (ref 0–200)
HDL: 35 mg/dL — ABNORMAL LOW (ref >39)
LDL Cholesterol: 143 mg/dL — ABNORMAL HIGH (ref 0–99)
Total CHOL/HDL Ratio: 5.5 RATIO
Triglycerides: 77 mg/dL (ref <150)
VLDL: 15 mg/dL (ref 0–40)

## 2011-03-06 LAB — TROPONIN I: Troponin I: 0.17 ng/mL — ABNORMAL HIGH (ref 0.00–0.06)

## 2011-03-06 LAB — D-DIMER, QUANTITATIVE: D-Dimer, Quant: 0.52 ug/mL-FEU — ABNORMAL HIGH (ref 0.00–0.48)

## 2011-03-06 LAB — BRAIN NATRIURETIC PEPTIDE: Pro B Natriuretic peptide (BNP): 547 pg/mL — ABNORMAL HIGH (ref 0.0–100.0)

## 2011-03-06 LAB — CK TOTAL AND CKMB (NOT AT ARMC)
CK, MB: 23.8 ng/mL — ABNORMAL HIGH (ref 0.3–4.0)
Relative Index: 4 — ABNORMAL HIGH (ref 0.0–2.5)
Total CK: 593 U/L — ABNORMAL HIGH (ref 7–232)

## 2011-03-06 LAB — HEPARIN LEVEL (UNFRACTIONATED)
Heparin Unfractionated: 0.3 IU/mL (ref 0.30–0.70)
Heparin Unfractionated: 0.41 IU/mL (ref 0.30–0.70)

## 2011-03-06 LAB — TSH: TSH: 0.95 u[IU]/mL (ref 0.350–4.500)

## 2011-03-12 ENCOUNTER — Encounter: Payer: Self-pay | Admitting: Cardiology

## 2011-03-12 ENCOUNTER — Other Ambulatory Visit: Payer: Self-pay | Admitting: *Deleted

## 2011-03-15 ENCOUNTER — Other Ambulatory Visit (HOSPITAL_COMMUNITY): Payer: Self-pay | Admitting: Radiology

## 2011-03-16 ENCOUNTER — Other Ambulatory Visit (HOSPITAL_COMMUNITY): Payer: Self-pay | Admitting: Radiology

## 2011-03-16 ENCOUNTER — Encounter: Payer: Self-pay | Admitting: Cardiology

## 2011-03-18 ENCOUNTER — Encounter: Payer: Self-pay | Admitting: Cardiology

## 2011-03-24 ENCOUNTER — Encounter: Payer: Self-pay | Admitting: Cardiology

## 2011-03-24 NOTE — Progress Notes (Signed)
This encounter was created in error - please disregard.

## 2011-04-13 NOTE — Cardiovascular Report (Signed)
NAMEMAURICE, FOTHERINGHAM                 ACCOUNT NO.:  [id number]   MEDICAL RECORD NO.:  [id number]          PATIENT TYPE:  INP   LOCATION:  4736                         FACILITY:  [facility]   PHYSICIAN:  Verne Carrow, MDDATE OF BIRTH:  Dec 11, 1955   DATE OF PROCEDURE:  07/28/2009  DATE OF DISCHARGE:                            CARDIAC CATHETERIZATION   PRIMARY CARDIOLOGIST:  Rollene Rotunda, MD, [facility]   PROCEDURES PERFORMED:  1. Left heart catheterization.  2. Right heart catheterization.  3. Selective coronary angiography.  4. Distal aortogram with nonselective injection of the bilateral renal      arteries.  5. Placement of an Angio-Seal femoral artery closure device.   OPERATOR:  Verne Carrow, MD   INDICATIONS:  Non-ST-elevation myocardial infarction in a patient who  was admitted with hypertensive urgency.  The patient had a diagnostic  left heart catheterization in March 2003 that showed nonobstructive  coronary artery disease.  At that time, he also had mild elevation in  his cardiac enzymes secondary to hypertensive urgency.   PROCEDURE IN DETAIL:  The patient was brought to the main cardiac  catheterization laboratory after signing informed consent for the  procedure.  The right groin was prepped and draped in a sterile fashion.  Lidocaine 1% was used for local anesthesia.  Using the modified  Seldinger technique, a 5-French sheath was placed in the right femoral  artery without difficulty.  A 7-French sheath was placed into the right  femoral vein without difficulty.  The right heart catheterization was  performed with a Swan-Ganz catheter.  Selective coronary angiography was  then performed with standard diagnostic catheters.  A JL-5 catheter was  used to selectively engage the left main; however, this did selectively  engage the circumflex system.  Caudal angiograms were taken to visualize  the circumflex anatomy and then the catheter was exchanged for an AL-2  catheter, which gave [place] better visualization of the left anterior  descending artery.  Selective angiography was taken from a cranial  approach of LAD.  At this point, a JR-4 catheter was used to selectively  engage the small nondominant right coronary artery.  A pigtail catheter  was used to cross the aortic valve into the left ventricle.  No left  ventricular angiogram was performed secondary to his renal  insufficiency.  The pigtail catheter was pulled around into the  descending aorta and a nonselective angiogram was taken of the aorta at  the level above the renal arteries.  The patient tolerated the procedure  well.  An Angio-Seal femoral artery closure device was placed in the  right femoral artery at the conclusion of the case.  The venous sheath  was removed before the patient left the cath lab.  He was taken to the  holding area in stable condition.   HEMODYNAMIC DATA:  Central aortic pressure 157/109.  Left ventricular  pressure 133/14.  Left ventricular end-diastolic pressure 26.  Right  atrial pressure 8.  Right ventricular pressure 31/8.  Right ventricular  end-diastolic pressure 9.  Pulmonary artery pressure 29/0 with a mean of  23.  Pulmonary  capillary wedge pressure 18.  Central aortic saturation  95%.  Pulmonary artery saturation 59%.  Cardiac output 4.84 L per  minute.  Cardiac index 2.04 L per minute per meter squared.   ANGIOGRAPHIC DATA:  1. The left main coronary artery is a very large-caliber vessel that      bifurcates into the circumflex and LAD.  There is no disease noted      in the left main.  2. The left anterior descending is a large-caliber vessel that courses      to the apex and gives off three small caliber diagonal branches.      There appears to be mild 20% plaque throughout the proximal and      midportion of the LAD.  There are no obstructive lesions in this      system.  3. The circumflex artery is a very large dominant vessel that gives       off two large caliber obtuse marginal branches and a large-caliber      left-sided posterior descending artery.  There is no obstructive      disease noted in this system.  4. The right coronary artery is a small nondominant vessel that has no      evidence of obstructive disease.  5. No left ventricular angiogram was performed.  6. Aortogram at the level above the renal arteries shows no evidence      of right or left renal artery stenosis.   IMPRESSION:  1. Mild nonobstructive coronary artery disease.  2. Mild elevation in troponin likely secondary to subendocardial      ischemia from hypertensive urgency.  3. No evidence of renal artery stenosis bilaterally.   RECOMMENDATIONS:  We will continue to titrate the patient's blood  pressure medications and will follow his creatinine closely following  the catheterization.  He will be hydrated gently overnight.      Verne Carrow, MD  Electronically Signed     CM/MEDQ  D:  07/28/2009  T:  07/29/2009  Job:  [id number]   cc:   Rollene Rotunda, MD, [facility]

## 2011-04-13 NOTE — Discharge Summary (Signed)
NAMEPHILLIP, Craig Prince                 ACCOUNT NO.:  [id number]   MEDICAL RECORD NO.:  [id number]          PATIENT TYPE:  INP   LOCATION:  4736                         FACILITY:  [facility]   PHYSICIAN:  Rollene Rotunda, MD, FACCDATE OF BIRTH:  1956/11/14   DATE OF ADMISSION:  07/27/2009  DATE OF DISCHARGE:  07/29/2009                               DISCHARGE SUMMARY   PRIMARY CARDIOLOGIST:  Rollene Rotunda, MD, [facility]   DISCHARGE DIAGNOSES:  1. Non-ST-elevation myocardial infarction.      a.     Most likely secondary to subendocardial ischemia from       hypertensive urgency.  Mild nonobstructive coronary artery disease       per cath this admission.  2. Hypertensive urgency.      a.     No evidence of renal artery stenosis per cath this       admission.  3. Mixed diastolic and systolic heart failure.      a.     Left ventricular ejection fraction in range of 20-25% with       diffuse hypokinesis.  Doppler parameters were consistent with       abnormal left ventricular relaxation (grade 1 diastolic       dysfunction) per 2-D echo this admission.  4. Renal insufficiency.   SECONDARY DIAGNOSIS:  Hypertension.   ALLERGIES:  NKDA.   PROCEDURES:  1. EKG on July 26, 2009:  Left ventricular hypertrophy with      repolarization changes.  2. Chest x-ray on July 26, 2009:  Stable cardiomegaly without edema.  3. A 2-D echocardiogram on July 28, 2009:  Left ventricular cavity      size mildly dilated, severe asymmetric hypertrophy of the septum,      ejection fraction in range of 20-25%, diffuse hypokinesis, grade 1      diastolic dysfunction.  Left atrium moderately dilated.  4. Cardiac catheterization on July 28, 2009:  Mild nonobstructive      coronary artery disease.  Mild elevation in troponin likely      secondary to subendocardial ischemia from hypertensive urgency.  No      evidence of renal artery stenosis bilaterally.   HISTORY OF PRESENT ILLNESS:  Mr. Prince is a 55 year old  African American  male with a known history of elevated cardiac enzymes, but minimal  nonobstructive CAD found per cath in 2003.  At that time, elevated  enzymes thought to be secondary to hypertensive urgency.  Note at that  time, he had well-preserved ejection fraction.   The patient reports not taking his medications for hypertension for some  time.  He has not seen a physician recently.  He presents with  increasing dyspnea over the past 6 months with activity.  On August 28,  he awoke with shortness of breath and was dyspneic throughout the day.  He also reports orthopnea and PND.  Denies cough, fevers, and chills.  At that time, no chest pressure, neck, or arm discomfort.  Today, the  late evening, July 26, 2009, he presents to ED at [facility] and was  noted  to have BP of 190/120.  EKG demonstrated LVH with repolarization  changes.  Cardiac enzymes elevated with CK 593, MB 23.8, and troponin  0.17.  BNP elevated to 547.  Chest x-ray demonstrates stable  cardiomegaly without edema.  He was treated with IV nitro, Lasix, ASA,  heparin, and labetalol IV.  Symptoms significantly improved.  The  patient denies any exertional angina, neck, or arm discomfort,  palpitations, presyncope, or syncope.  He was noted to be somewhat stoic  on exam.   HOSPITAL COURSE:  The patient was admitted and underwent procedures as  described above.  He tolerated them well without significant  complications.  BP meds were added and titrated as tolerated.  The  patient was noted to have renal insufficiency with initial creatinine of  1.46, which seems to be about the patient's baseline.  ACE inhibitor  will be titrated as tolerated due to this finding.  The patient  underwent a 2-D echocardiogram and cardiac catheterization (see results  above).  Per the findings of nonobstructive CAD and significant  improvement in the patient's symptoms as well as BP control, he was  deemed stable for discharge by his  primary cardiologist on July 29, 2009.  Note, he did ambulate with his nurse without any significant  dyspnea for any chest pain.  The patient will be discharged on  medications started in the hospital.  At that time, he will be given his  new medication list, prescriptions, postcath instructions, and followup  instructions.  All questions or concerns will be addressed prior to  discharge.  Note, the patient has appointment for basic metabolic panel  to assess his renal function and potassium given his new meds next week  at [facility].  Also, the patient already has a followup  appointment with his primary cardiologist, Dr. Antoine Poche on August 20, 2009, at 3:30 p.m.   DISCHARGE LABORATORIES:  WBC is 8.7, HGB 13.0, HCT 40.6, and PLT count  222.  Protime 14.1 and INR of 1.1.  Sodium 139, potassium 4.1, chloride  100, CO2 of 30, BUN 21, creatinine 1.48, glucose 110, and calcium 9.2.  Cardiac enzymes are positive with peak CK of 446, MB 18.9, positive  index at 4.2, and troponin I of 0.17.  Cholesterol 193, triglycerides  77, HDL 35, and LDL 143.  TSH 0.950.   FOLLOWUP PLANS AND APPOINTMENT:  See hospital course and discharge meds.  Please see computer list.   OUTSTANDING LABORATORIES AND STUDIES:  BMET pending, to be completed on  August 01, 2009, at 9:45 a.m. at [facility].   DURATION OF DISCHARGE/ENCOUNTER:  35 minutes including physician time.      Jarrett Ables, [facility]      Rollene Rotunda, MD, [facility]  Electronically Signed    MS/MEDQ  D:  07/29/2009  T:  07/30/2009  Job:  [id number]

## 2011-04-13 NOTE — H&P (Signed)
NAMENEGAN, GRUDZIEN                 ACCOUNT NO.:  [id number]   MEDICAL RECORD NO.:  [id number]          PATIENT TYPE:  INP   LOCATION:  4736                         FACILITY:  [facility]   PHYSICIAN:  Rollene Rotunda, MD, FACCDATE OF BIRTH:  25-Aug-1956   DATE OF ADMISSION:  07/27/2009  DATE OF DISCHARGE:                              HISTORY & PHYSICAL   REASON FOR PRESENTATION:  Evaluate the patient with shortness of breath.   HISTORY OF PRESENT ILLNESS:  The patient is a 55 year old African  American gentleman who had a presentation for evaluation of hypertensive  urgency in 2003.  At that time, he did have mildly elevated cardiac  enzymes and had a cardiac catheterization demonstrating only minimal  coronary plaque and well-preserved ejection fraction.  Does not appear  that he has taken medications for his hypertension for some time.  He  does not see a physician.  He has had increasing dyspnea over about 6  months with activities.  On August 28, however, he woke with shortness  of breath and was dyspneic throughout the day.  He had trouble laying  flat describing PND and orthopnea.  He was not having any cough, fevers  or chills.  He was not having any chest pressure, neck or arm  discomfort.  He presented to the emergency room where he was noted to  have blood pressure on presentation of 190/120.  EKG demonstrated LVH  with repolarization changes.  However, cardiac enzymes were elevated  with a CK of 593, MB 23.8, troponin 0.17.  His BNP was elevated at 547.  Chest x-ray demonstrated stable cardiomegaly without edema.  He was  treated with IV nitroglycerin, Lasix, aspirin, heparin and labetalol IV.  He had improvement in his symptoms.   The patient reports that he is somewhat active, though he does not  exercise.  He works for [employer] and has to do a lot of lifting.  He has been getting dyspneic, but still denies chest discomfort, neck or  arm discomfort.  Has not noticed any  palpitations, presyncope or  syncope.  He seems to be somewhat stoic.   PAST MEDICAL HISTORY:  Hypertension.   PAST SURGICAL HISTORY:  None.   ALLERGIES:  None.   CURRENT MEDICATIONS:  None.   SOCIAL HISTORY:  The patient is married.  He has children.  He reports  he never smoked cigarettes and does not drink alcohol.  He works for [employer].   FAMILY HISTORY:  He denies any early cardiovascular history.  However,  in reviewing old records, there is some comment of his mother having  heart surgery, but no details.  His father had renal failure.   REVIEW OF SYSTEMS:  Positive for 30-40 pounds weight gain in about 6  months.  Otherwise, as stated in the HPI, negative for all other  systems.   PHYSICAL EXAMINATION:  GENERAL:  The patient is in no distress.  He has  a somewhat flat affect.  VITAL SIGNS:  Blood pressure 174/127, heart rate 79 and regular,  respiratory rate 16.  HEENT:  Eyes are pupils equal and reactive.  Fundi not visualized, oral  mucosa unremarkable.  NECK:  No jugular distention at 45 degrees, carotid upstroke brisk and  symmetric.  No bruits, no thyromegaly.  LYMPHATICS:  No cervical, axillary, inguinal adenopathy.  LUNGS:  Clear to auscultation bilaterally.  BACK:  No costovertebral angle tenderness.  CHEST:  Unremarkable.  HEART:  PMI not displaced or sustained, S1-S2 within normal limits, no  S3-S4, no clicks, rubs, murmurs.  ABDOMEN:  Mildly obese, positive bowel sounds normal frequency and  pitch.  No bruits, rebound or guarding or midline pulsatile mass.  No  hepatosplenomegaly  SKIN:  No rashes, no nodules.  EXTREMITIES:  There is 2+ pulses throughout, no edema, no cyanosis or  clubbing.  NEUROLOGICAL:  Oriented to place, time, cranial nerves II-XII grossly  intact, motor grossly intact.   STUDIES:  EKG as above.   Chest x-ray as above.   LABORATORY DATA:  Sodium 138, testing 3.2, BUN 21, creatinine 1.49, WBC  0.4, hemoglobin  13.1.   ASSESSMENT/PLAN:  1. Dyspnea.  The patient's dyspnea is related most likely of diastolic      dysfunction plus or minus systolic left ventricular dysfunction.      This may all be related to his hypertension and hypertensive      urgency.  However, he does have elevated cardiac enzymes and acute      ischemic event as a contribution cannot be excluded, though, I      suspect it is a secondary troponin elevation.  The patient will be      admitted.  He will have IV diuresis.  We will watch his I's and O's      carefully.  Will cycle cardiac enzymes.  I will order an      echocardiogram.  He will need a right and left heart      catheterization electively.  2. Hypertension.  This has not been treated.  For now, we will use      nitroglycerin paste.  I am going to start labetalol and an ACE      inhibitor.  He will most likely need up titration of this and      multiple medications at discharge.  I would suggest renal      arteriogram at the time of catheterization as well.  3. Obesity.  He will be educated.  4. Other.  Will check a TSH.  Check a lipid profile.      Rollene Rotunda, MD, [facility]  Electronically Signed     JH/MEDQ  D:  07/27/2009  T:  07/27/2009  Job:  [id number]

## 2011-04-16 NOTE — Cardiovascular Report (Signed)
[place]. [facility]  Patient:    Craig Prince, Craig Prince Visit Number: [id number] MRN: [id number]          Service Type: MED Location: [phone] Attending Physician:  Levy Sjogren Dictated by:   Veneda Melter, M.D. Proc. Date: 01/31/02 Admit Date:  01/29/2002   CC:         Lewayne Bunting, M.D. [facility]  Piedad Climes. Roseanne Reno, M.D.   Cardiac Catheterization  PROCEDURE: 1. Left heart catheterization 2. Left ventriculogram. 3. Selective coronary angiography. 4. Abdominal aortogram.  DIAGNOSES: 1. Mild coronary artery disease by angiogram. 2. Normal left ventricular systolic function. 3. Patent renal arteries bilaterally.  CARDIOLOGIST:  Veneda Melter, M.D.  HISTORY:  Mr. Craig Prince is a 55 year old black gentleman with a history of hypertension who presents to [facility] with hypertensive crisis.  The patient had abnormal ECG as well as mild elevation of cardiac enzymes suggestive of myocardial injury.  He presents for further cardiac assessment.  TECHNIQUE:  After informed consent was obtained, the patient was brought to the catheterization lab.  A 6-French sheath was placed in the left femoral artery using modified Seldinger technique.  A 6-French JL4 and JR4 catheters were then used to engage the left and right coronary arteries and selective angiography performed using manual injection of contrast.  A 6-French pigtail catheter was then advanced into the left ventricle, and appropriate left-sided hemodynamics were obtained.  A left ventriculogram and an abdominal aortogram were then performed using power injections of contrast.  Finally, a 6-French AL2 catheter was introduced, and additional imaging of the left circumflex artery was performed again using manual injection of contrast.  At the termination of the case, catheters and sheaths were removed and manual pressure applied until adequate hemostasis was achieved.  The patient tolerated the procedure well  and was transferred to the floor in stable condition.  FINDINGS: 1. Left main trunk: A large caliber vessel with mild irregularities. 2. LAD.  This is a large caliber vessel that provides three small diagonal    branches.  The LAD has mild irregularities of 10 to 15% in the proximal    segment. 3. Left circumflex artery: This is a large caliber vessel that is    dominant and provides a three marginal branches in the posterior descending    artery.  The A-V circumflex has mild disease of 20 to 30% in the proximal    segment.  The first two marginal branches are medium caliber vessels    with mild irregularities.  A large third marginal branch in the distal    segment also has mild irregularities.  The posterior descending artery is    a small caliber vessel that tapers in the mid section of the inferior    septum. 4. Right coronary artery is nondominant.  This is a small caliber vessel that    provides an RV marginal branch.  LEFT VENTRICULOGRAPHY:  Normal end-systolic and end-diastolic dimensions. Overall left ventricular function appears well preserved, ejection fraction greater than 55%.  No mitral regurgitation.  Significant left ventricular hypertrophy is noted with hypokinesis of the apex.  LV pressure 140/15, aortic 140/90.  LV EDP equals 35.  ABDOMINAL AORTOGRAM:  The abdominal aorta is normal caliber with only mild atheromatous buildup.  There is one right and two left renal arteries which are widely patent with only mild irregularities.  ASSESSMENT/PLAN:  Craig Prince is a 56 year old gentleman with mild coronary artery disease and well preserved left ventricular function.  He does have mild apical hypokinesis which may be due to left ventricular hypertrophy versus transient spasm of the left anterior descending artery.  In addition, perfusion mismatch due to uncontrolled hypertension may also be a cause of ventricular stunning.  At this point, no critical disease is noted,  and continued medical therapy will be pursued. Dictated by:   Veneda Melter, M.D. Attending Physician:  Levy Sjogren DD:  01/31/02 TD:  01/31/02 Job: [id number]

## 2011-04-16 NOTE — Consult Note (Signed)
[place]. [facility]  Patient:    Craig Prince, Craig Prince Visit Number: [id number] MRN: [id number]          Service Type: MED Location: [phone] Attending Physician:  Levy Sjogren Dictated by:   Lewayne Bunting, M.D. [facility] Proc. Date: 01/30/02 Admit Date:  01/29/2002   CC:         Craig Prince, M.D.   Consultation Report  DATE OF BIRTH:  1956-07-16  CHIEF COMPLAINT:  Headache, nausea and vomiting.  Hypertensive urgency.  HISTORY OF PRESENT ILLNESS:  Craig Prince is a 55 year old male with a longstanding history of hypertension and no hypertensive hypertrophic cardiomyopathy.  The patient has been admitted with worsening hypertension as well as elevated cardiac enzymes.  The patient reportedly was rather compliant with his blood pressure medications.  He states at most he may have missed one dose.  However, the situation is complicated by the fact that he took multiple cold medications in the past month including NyQuil, TheraFlu, and Alka Seltzer.  The patient reported significant headache on the day of admission associated with dizziness and vomiting.  He as markedly hypertensive with blood pressure of 188/116.  The patient subsequently was treated with intravenous labetalol.  Subsequent cardiac enzymes were found to be mildly elevated.  Despite relative blood pressure control, the patient continued to complain of headache.  His 12-lead electrocardiogram on admission showed marked LVH with secondary repolarization changes but now more progression of T-wave inversion possible consistent with myocardial ischemia/injury pattern.  A 2-D echocardiogram performed in July 2002 was noted to demonstrate an ejection fraction of 55 to 65% with asymmetrical septal hypertrophy but no systolic mitral valve or left ventricular outflow tract gradient.  ALLERGIES:  No known drug allergies.  MEDICATIONS: 1. Aspirin. 2. Vasotec 5 mg a day. 3. Tenormin 50 mg a  day. 4. Hydrochlorothiazide 12.5 mg p.o. q.d.  PAST MEDICAL HISTORY: 1. Longstanding hypertension. 2. Left ventricular hypertrophy by 2-D echocardiogram. 3. History of GI bleed.  SOCIAL HISTORY:  The patient is married and has seven children.  He works in Production designer, theatre/television/film at [employer].  He also works for [employer].  The patient denies tobacco use or use of any dietary supplements.  FAMILY HISTORY:  Mother reportedly had heart surgery, but details are not known.  His father had kidney failure.  He has 13 siblings with no significant cardiovascular disease.  REVIEW OF SYSTEMS:  No fever or chills.  Positive for headache.  No sore throat.  No rash or lesions.  He reports marked dyspnea on exertion over the last several weeks.  No chest pain, no orthopnea or PND, no presyncope.  No frequency or dysuria.  No weakness or numbness.  No myalgias or arthralgias. He reports nausea and vomiting as outlined above.  No polyuria or polydipsia.  PHYSICAL EXAMINATION:  VITAL SIGNS:  Blood pressure 150/88, heart rate 68 beats per minute, respirations 18.  Saturation is 96% on room air.  Temperature 97.6.  GENERAL:  Obese African-American male in no apparent distress.  HEENT:  Normocephalic, atraumatic.  PERRLA.  EOMI.  NECK:  Supple.  Soft bilateral bruits radiating from the left upper sternal border.  No lymphadenopathy.  HEART:  Regular rate and rhythm.  Normal S1 and S2, positive S4.  There is a 2/6 systolic murmur at the left upper sternal border with radiation to the left lower sternal border.  LUNGS:  Clear.  SKIN:  No rash or lesions.  ABDOMEN:  Soft, nontender.  GU/RECTAL:  Examinations deferred.  EXTREMITIES:  No cyanosis, clubbing, or edema.  MUSCULOSKELETAL:  No joint deformity.  NEUROLOGIC:  The patient is alert and oriented, grossly nonfocal.  DIAGNOSTIC DATA:  Chest x-ray:  Not available.  EKG: Normal sinus rhythm with left ventricular hypertrophy, deeply inverted  T waves laterally consistent with secondary repolarization changes or consistent with ischemic changes.  Hemoglobin 18, hematocrit 53.  Sodium 136, potassium 3.4, chloride 100, bicarb 28, BUN 17, creatinine 1.2.  CK 718 with MB 26; previously 584 with MB 15; and the one before this was 543 with MB of 19.  Troponin has been mildly elevated at 0.08 x 2.  IMPRESSION AND PLAN: 1. Hypertensive crisis.  The patients symptoms of nausea and vomiting as well    as headache could be explained by his hypertensive crisis.  However, I am a    little bit concerned that the patient continues to have headache despite    control of his blood pressure.  Agree with the current medical therapy.    Electrocardiographic changes are consistent with ischemia versus    repolarization changes.  I do suspect the patient has significant supply    and demand mismatch due to combination of hypertension and marked left    ventricular hypertrophy, possibly causing these electrocardiogram changes.    However, epicardial coronary artery disease cannot be excluded.  Given the    patients risk factor profile and enzyme abnormalities, I would favor to    proceed with cardiac catheterization.  This will allow [place] to further    explore possible secondary causes of his hypertension including renal    vascular hypertension (renal artery stenosis).  The patient does have a    relatively low potassium at 3.4, possibly suggestive of this diagnosis,    although this is certainly nonspecific due to his use of diuretics.  I    would also obtain a serum ______ and plasma renin activity to further    investigate possible causes of secondary hypertension. 2. Headache.  I am not certain this can be explained by the patients    hypertension.  I would recommend strongly to proceed with CT scan to rule    out possible intracranial bleeding including small subarachnoid hemorrhage    that possibly could have triggered his hypertensive  urgency.  I do think     this diagnosis less likely, but it should be explored prior to cardiac    catheterization. 3. Elevated hemoglobin.  E causa ignota.  Further evaluation per primary care    service. 4. History of gastrointestinal bleed, currently stable.  DISPOSITION:  The patient will be consented for cardiac catheterization in the morning.  Risks and benefits of this procedure have been discussed with the patient, and he is willing to proceed. Dictated by:   Lewayne Bunting, M.D. [facility] Attending Physician:  Levy Sjogren DD:  01/30/02 TD:  01/30/02 Job: [id number]

## 2011-04-16 NOTE — Discharge Summary (Signed)
[place]. [facility]  Patient:    Craig Prince, Craig Prince Visit Number: [id number] MRN: [id number]          Service Type: MED Location: [phone] Attending Physician:  Doneta Public Dictated by:   Vear Clock, M.D. Admit Date:  01/29/2002 Discharge Date: 02/01/2002   CC:         Bradly Bienenstock, M.D.  Veneda Melter, M.D. [facility]  Lewayne Bunting, M.D. [facility]   Discharge Summary  CHIEF COMPLAINT:  Hypertensive urgency.  PRIMARY PHYSICIAN:  Bradly Bienenstock, M.D. of the [facility].  DISCHARGE DIAGNOSES: 1. Hypertension with urgency. 2. Mild coronary artery disease. 3. Normal left ventricular function with an ejection fraction of greater    than 55 percent. 4. Left ventricular hypertrophy. 5. Headaches. 6. History of gastrointestinal bleeding.  DISCHARGE MEDICATIONS: 1. Aspirin 325 mg p.o. q.d. 2. Hydrochlorothiazide 25 mg p.o. q.d. 3. Enalapril 10 mg p.o. q.d. 4. Atenolol 50 mg p.o. q.d. 5. Nitroglycerin 0.4 mg, 1 tablet sublingual every 5 minutes x3 doses    p.r.n. chest pain. 6. Tylenol 325, 1-2 tabs p.o. q.4-6h p.r.n. headache.  FOLLOWUP:  The patient is to follow up with Dr. Lynnea Ferrier Hecox of the [facility] on Tuesday, March 11 at 9:35 a.m.  PROCEDURES AND DIAGNOSTIC STUDIES: 1. Cardiac catheterization on January 31, 2002 showed mild coronary artery    disease, preserved left ventricular function, mild apical hypokinesis,    perhaps due to left ventricular hypertrophy versus transient spasm of    the LAD.  In addition, perfusion mismatch due to uncontrolled hypertension    but no critical disease.  Suggest continued medical therapy. 2. Head CT on January 30, 2002 shows normal intercranially and chronic right    maxillary sinusitis.  CONSULTANTS:  [place] [facility].  ADMISSION HISTORY AND PHYSICAL:  Please see the dictated history and physical for details.  In short, Mr. Tones is a 55 year old male with  longstanding hypertension and a history of hypertensive urgency including headache, nausea, and vomiting.  He presented with this at this hospitalization as well.  HOSPITAL COURSE: #1 - HYPERTENSION:  The patient presented on hydrochlorothiazide and Vasotec. He had not been taking Tenormin.  The patient was started on aspirin 325 q.d. as well as atenolol 50 mg p.o. q.d. in addition to his Vasotec, which was increased to 10 mg p.o. q.d., and hydrochlorothiazide increased to 25 mg p.o. q.d. and did well with his hypertension.  His headache and other symptoms resolved.  Blood pressure at discharge was 148/70.  #2 - HEADACHE:  Likely secondary to stress versus hypertension.  The patient was advised to take Tylenol for this headache, may need referral to headache clinic at some point in the future.  #3 -  CARDIOVASCULAR:  The patient was seen by cardiology and they suggested medical management of his hypertension.  Will continue him on aspirin, beta-blocker, ACE inhibitor, and diuretic.  May need to increase dosages to maximize efficacy.  Additionally, the patients cholesterol was slightly elevated but this has a wide range of lab error.  Therefore, we recommend a follow-up cholesterol as an outpatient with consideration of administration of a statin.  Additionally, the patient should be on the American Heart Association diet.  No other issues developed during this hospitalization and the patient was discharged to home without further incident.  DISCHARGE LABORATORY: 1. CBC: WBC is 8.5, hemoglobin 14.1, platelets 164.  PTT 28, PT 14.0, INR 1.0.  Cholesterol 175, triglycerides 175, HDL 33, LDL 107. 2. BMET: Sodium 136, potassium 3.4, chloride 100.  CO2 28, glucose 106, BUN    17, creatinine 1.2, and calcium 9.1. 3. Elevation of cardiac enzymes throughout hospitalization with troponins    stable at 0.08.  CK on admission was 718 which declined to 584 and 543 at    last check with a  relative index of 3.7-3.5.  The patient was discharged to home without further incident.  Social work was contacted regarding the fact that the patient has no insurance and has difficulty paying for medications.  The least expensive medications in each category were attempted to be chosen so that the patient would be able to afford the medications as much as possible. Dictated by:   Vear Clock, M.D. Attending Physician:  Doneta Public DD:  02/01/02 TD:  02/03/02 Job: [id number]

## 2011-04-20 ENCOUNTER — Other Ambulatory Visit: Payer: Self-pay | Admitting: Cardiovascular Disease

## 2011-04-21 ENCOUNTER — Telehealth: Payer: Self-pay | Admitting: Cardiology

## 2011-04-21 MED ORDER — AMLODIPINE BESYLATE 10 MG PO TABS: 10.0000 mg | ORAL_TABLET | Freq: Every day | ORAL | Status: DC

## 2011-04-21 MED ORDER — FUROSEMIDE 40 MG PO TABS: 40.0000 mg | ORAL_TABLET | Freq: Every day | ORAL | Status: DC

## 2011-04-21 NOTE — Telephone Encounter (Signed)
Pt needs lasix and norvasc to be call in to [facility] # [phone]

## 2011-05-04 ENCOUNTER — Encounter: Payer: Self-pay | Admitting: Physician Assistant

## 2011-05-04 ENCOUNTER — Ambulatory Visit (INDEPENDENT_AMBULATORY_CARE_PROVIDER_SITE_OTHER): Payer: Self-pay | Admitting: Physician Assistant

## 2011-05-04 ENCOUNTER — Ambulatory Visit (HOSPITAL_COMMUNITY): Payer: Self-pay | Attending: Internal Medicine | Admitting: Radiology

## 2011-05-04 VITALS — BP 126/90 | HR 62 | Ht 71.0 in | Wt 250.0 lb

## 2011-05-04 DIAGNOSIS — I509 Heart failure, unspecified: Secondary | ICD-10-CM

## 2011-05-04 DIAGNOSIS — I10 Essential (primary) hypertension: Secondary | ICD-10-CM | POA: Insufficient documentation

## 2011-05-04 DIAGNOSIS — I379 Nonrheumatic pulmonary valve disorder, unspecified: Secondary | ICD-10-CM | POA: Insufficient documentation

## 2011-05-04 DIAGNOSIS — I421 Obstructive hypertrophic cardiomyopathy: Secondary | ICD-10-CM | POA: Insufficient documentation

## 2011-05-04 DIAGNOSIS — I2510 Atherosclerotic heart disease of native coronary artery without angina pectoris: Secondary | ICD-10-CM | POA: Insufficient documentation

## 2011-05-04 DIAGNOSIS — I5022 Chronic systolic (congestive) heart failure: Secondary | ICD-10-CM | POA: Insufficient documentation

## 2011-05-04 DIAGNOSIS — I079 Rheumatic tricuspid valve disease, unspecified: Secondary | ICD-10-CM | POA: Insufficient documentation

## 2011-05-04 DIAGNOSIS — I422 Other hypertrophic cardiomyopathy: Secondary | ICD-10-CM | POA: Insufficient documentation

## 2011-05-04 DIAGNOSIS — I428 Other cardiomyopathies: Secondary | ICD-10-CM | POA: Insufficient documentation

## 2011-05-04 DIAGNOSIS — I059 Rheumatic mitral valve disease, unspecified: Secondary | ICD-10-CM | POA: Insufficient documentation

## 2011-05-04 LAB — BASIC METABOLIC PANEL
BUN: 25 mg/dL — ABNORMAL HIGH (ref 6–23)
CO2: 23 mEq/L (ref 19–32)
Calcium: 9.3 mg/dL (ref 8.4–10.5)
Chloride: 108 mEq/L (ref 96–112)
Creatinine, Ser: 1.5 mg/dL (ref 0.4–1.5)
GFR: 53.53 mL/min — ABNORMAL LOW (ref >60.00)
Glucose, Bld: 102 mg/dL — ABNORMAL HIGH (ref 70–99)
Potassium: 4.4 mEq/L (ref 3.5–5.1)
Sodium: 139 mEq/L (ref 135–145)

## 2011-05-04 NOTE — Assessment & Plan Note (Signed)
His EF is recovered.  He needs better BP control and I will increase enalapril.  See below.  Follow up with Dr. Antoine Poche in 4-6 weeks.

## 2011-05-04 NOTE — Assessment & Plan Note (Signed)
Now class 1-2 with normalized EF.

## 2011-05-04 NOTE — Assessment & Plan Note (Signed)
Increase enalapril to 20 mg BID.  Check BMET today and repeat in 1 week.

## 2011-05-04 NOTE — Progress Notes (Signed)
History of Present Illness: Primary Cardiologist:  Dr. Rollene Rotunda  Craig Prince is a 55 y.o. male with a non-ischemic cardiomyopathy who presents for 4 month follow up today.  Repeat echo was done today.  Last EF assessed in 06/2009 and was 20-25%.  He has had his medications titrated to include beta blocker, ACE inhibitor and furosemide.  His last visit was in 09/2010 and Dr. Antoine Poche increased his carvedilol to 50 mg BID at that time.    He is doing well.  No chest pain, dyspnea, orthopnea, PND, edema, syncope.  He describes NYHA class 1-2 symptoms.  Taking all meds.  We have coreg listed at 12.5 mg BID.  He will call us when he gets home to update our list.  He had an echo today and I reviewed it with Dr. Eden Emms.  His EF is now 50-55%.  He has asymmetric septal hypertrophy.  Dr. Eden Emms measured this at about 26 mm.  I then discussed this with Dr. Graciela Husbands.  To risk stratify him for his nonobs. HCM, he will need a holter and an ETT to assess BP response to exercise.  He does not have a FHx of sudden cardiac death or a h/o syncope.    Past Medical History  Diagnosis Date  . Essential hypertension, benign   . CAD (coronary artery disease)     NONOBSTRUCTIVE  a. cath 8/10: prox to mid LAD 20%  . NICM (nonischemic cardiomyopathy)     a. echo 8/10: EF20-25%, mod LAE, severe asymmetric septal hypertrophy (? nonobs. HCM); grade 1 diast dyfxn  . Chronic systolic heart failure   . CKD (chronic kidney disease)     baseline creatinine approx 1.5  . Hypertrophic nonobstructive cardiomyopathy     septal hypertrophy without LVOT    Current Outpatient Prescriptions  Medication Sig Dispense Refill  . amLODipine (NORVASC) 10 MG tablet Take 1 tablet (10 mg total) by mouth daily.  90 tablet  3  . aspirin 81 MG tablet Take 81 mg by mouth daily.        . carvedilol (COREG) 12.5 MG tablet Take 12.5 mg by mouth 2 (two) times daily with a meal.        . enalapril (VASOTEC) 10 MG tablet Take 10 mg by mouth 2  (two) times daily.        . furosemide (LASIX) 40 MG tablet Take 1 tablet (40 mg total) by mouth daily.  90 tablet  3    Allergies: No Known Allergies  History  Substance Use Topics  . Smoking status: Never Smoker   . Smokeless tobacco: Not on file  . Alcohol Use: Yes    Vital Signs: BP 126/90  Pulse 62  Ht 5\' 11"  (1.803 m)  Wt 250 lb (113.399 kg)  BMI 34.87 kg/m2  PHYSICAL EXAM: Well nourished, well developed, in no acute distress HEENT: normal Neck: no JVD Cardiac:  normal S1, S2; RRR; I do not appreciate a murmur Lungs:  clear to auscultation bilaterally, no wheezing, rhonchi or rales Abd: soft, nontender, no hepatomegaly Ext: no edema Skin: warm and dry Neuro:  CNs 2-12 intact, no focal abnormalities noted  EKG:  NSR, HR 62, LAD, LVH, repol. abnormality  ASSESSMENT AND PLAN:

## 2011-05-04 NOTE — Patient Instructions (Addendum)
Your physician recommends that you schedule a follow-up appointment in: 4-6 WEEKS WITH DR. HOCHREIN AS PER SCOTT WEAVER, PA-C.  Your physician has recommended you make the following change in your medication: INCREASE ENALPRIL TO 20 MG 1 TAB TWICE DAILY; PLEASE CALL OUR OFFICE TODAY TO LET us KNOW WHAT YOUR COREG DOSE IS [phone] SCOTT WEAVER, PA-C  Your physician has requested that you have an exercise tolerance test THIS CAN BE DONE WITHIN THE NEXT 1-2 WEEKS, 428.22. For further information please visit [email or web address] Please also follow instruction sheet, as given.  Your physician has recommended that you wear a 24 HOUR holter monitor 428.22. Holter monitors are medical devices that record the heart's electrical activity. Doctors most often use these monitors to diagnose arrhythmias. Arrhythmias are problems with the speed or rhythm of the heartbeat. The monitor is a small, portable device. You can wear one while you do your normal daily activities. This is usually used to diagnose what is causing palpitations/syncope (passing out).   Your physician recommends that you return for lab work in: TODAY BMET 428.22

## 2011-05-04 NOTE — Assessment & Plan Note (Signed)
As noted, I reviewed with Dr. Eden Emms and Dr. Graciela Husbands.  I will set him up for a plain ETT and a 24 hour holter and follow up with Dr. Antoine Poche.

## 2011-05-05 ENCOUNTER — Encounter: Payer: Self-pay | Admitting: *Deleted

## 2011-05-05 ENCOUNTER — Other Ambulatory Visit: Payer: Self-pay | Admitting: *Deleted

## 2011-05-05 ENCOUNTER — Telehealth: Payer: Self-pay | Admitting: Cardiology

## 2011-05-05 DIAGNOSIS — I5022 Chronic systolic (congestive) heart failure: Secondary | ICD-10-CM

## 2011-05-05 MED ORDER — CARVEDILOL 25 MG PO TABS: 25.0000 mg | ORAL_TABLET | Freq: Two times a day (BID) | ORAL | Status: DC

## 2011-05-05 NOTE — Telephone Encounter (Signed)
Pt to call carvedilol dosage in today 25mg  takes two tabs twice a day

## 2011-05-05 NOTE — Telephone Encounter (Signed)
Medication list corrected to reflect carvedilol 25 mg twice daily

## 2011-05-13 ENCOUNTER — Encounter: Payer: Self-pay | Admitting: Physician Assistant

## 2011-05-19 ENCOUNTER — Ambulatory Visit (INDEPENDENT_AMBULATORY_CARE_PROVIDER_SITE_OTHER): Payer: Self-pay | Admitting: Physician Assistant

## 2011-05-19 ENCOUNTER — Encounter (INDEPENDENT_AMBULATORY_CARE_PROVIDER_SITE_OTHER): Payer: Self-pay

## 2011-05-19 DIAGNOSIS — R079 Chest pain, unspecified: Secondary | ICD-10-CM

## 2011-05-19 DIAGNOSIS — I422 Other hypertrophic cardiomyopathy: Secondary | ICD-10-CM

## 2011-05-19 NOTE — Progress Notes (Signed)
Exercise Treadmill Test  Pre-Exercise Testing Evaluation Rhythm: Sinus  Rate: 68   PR:  .23 QRS:  .14  QT:  .41 QTc: .43     Test  Exercise Tolerance Test Ordering MD: Angelina Sheriff, MD  Interpreting MD:  Tereso Newcomer PA-C  Unique Test No: 1  Treadmill:  1  Indication for ETT: chest pain - rule out ischemia  Contraindication to ETT: No   Stress Modality: exercise - treadmill  Cardiac Imaging Performed: non   Protocol: standard Bruce - maximal  Max BP:  172/82  Max MPHR (bpm):  165 85% MPR (bpm):  140  MPHR obtained (bpm)110 % MPHR obtained:   Reached 85% MPHR (min:sec):  Total Exercise Time (min-sec)6:35  Workload in METS:10.8 Borg Scale:15  Reason ETT Terminated:  patient's desire to stop    ST Segment Analysis At Rest: non-specific ST segment slurring - LVH with repolarization abnormality With Exercise: borderline ST changes at submaximal exercise.  Other Information Arrhythmia:  No Angina during ETT:  absent (0) Quality of ETT:  non-diagnostic  ETT Interpretation:  borderline (indeterminate) with non-specific ST changes - baseline LVH with repolarization abnormalities  Comments: Fair exercise tolerance. Submaximal exercise. Normal BP response to exercise. No chest pain. No ventricular arrhythmias with patient's maximal effort.  Recommendations: Follow up with Dr. Antoine Poche as directed.

## 2011-06-10 ENCOUNTER — Emergency Department (HOSPITAL_COMMUNITY): Payer: Self-pay

## 2011-06-10 ENCOUNTER — Emergency Department (HOSPITAL_COMMUNITY)
Admission: EM | Admit: 2011-06-10 | Discharge: 2011-06-10 | Disposition: A | Discharge status: Home / Self Care | Payer: Self-pay | Attending: Emergency Medicine | Admitting: Emergency Medicine

## 2011-06-10 DIAGNOSIS — M65839 Other synovitis and tenosynovitis, unspecified forearm: Secondary | ICD-10-CM | POA: Insufficient documentation

## 2011-06-10 DIAGNOSIS — I10 Essential (primary) hypertension: Secondary | ICD-10-CM | POA: Insufficient documentation

## 2011-06-10 DIAGNOSIS — M79609 Pain in unspecified limb: Secondary | ICD-10-CM | POA: Insufficient documentation

## 2011-06-10 DIAGNOSIS — M7989 Other specified soft tissue disorders: Secondary | ICD-10-CM | POA: Insufficient documentation

## 2011-06-11 ENCOUNTER — Encounter: Payer: Self-pay | Admitting: Cardiology

## 2011-06-11 ENCOUNTER — Ambulatory Visit (INDEPENDENT_AMBULATORY_CARE_PROVIDER_SITE_OTHER): Payer: Self-pay | Admitting: Cardiology

## 2011-06-11 DIAGNOSIS — I10 Essential (primary) hypertension: Secondary | ICD-10-CM

## 2011-06-11 DIAGNOSIS — E669 Obesity, unspecified: Secondary | ICD-10-CM

## 2011-06-11 DIAGNOSIS — I428 Other cardiomyopathies: Secondary | ICD-10-CM

## 2011-06-11 NOTE — Assessment & Plan Note (Signed)
I am very proud of him and he will continue with weight loss.

## 2011-06-11 NOTE — Assessment & Plan Note (Signed)
His BP is now improved. He seems to be euvolemic.  At this point, no change in therapy is indicated.  We have reviewed salt and fluid restrictions.  No further cardiovascular testing is indicated.

## 2011-06-11 NOTE — Assessment & Plan Note (Signed)
The blood pressure continues to be high. I have instructed the patient to record a blood pressure diary and recording this. This will be presented for my review and pending these results I will make further suggestions about changes in therapy for optimal blood pressure control.  

## 2011-06-11 NOTE — Patient Instructions (Signed)
Follow up in 6 months with Dr Hochrein.  You will receive a letter in the mail 2 months before you are due.  Please call us when you receive this letter to schedule your follow up appointment.  Please continue your current medications as listed 

## 2011-06-11 NOTE — Progress Notes (Signed)
HPI The patient presents for followup of his hypertension and cardiomyopathy. He had he is carvedilol adjusted recently and did well with this. He says his blood pressure is controlled at home. He forgot his p.m. meds last night which might be why he is blood pressure is up today. I do note that his ejection fraction in June of was up to 55% from a low of 25% previously. He feels well.  He has lost weight. The patient denies any new symptoms such as chest discomfort, neck or arm discomfort. There has been no new shortness of breath, PND or orthopnea. There have been no reported palpitations, presyncope or syncope.   No Known Allergies  Current Outpatient Prescriptions  Medication Sig Dispense Refill  . amLODipine (NORVASC) 10 MG tablet Take 1 tablet (10 mg total) by mouth daily.  90 tablet  3  . aspirin 81 MG tablet Take 81 mg by mouth daily.        . carvedilol (COREG) 25 MG tablet Take 1 tablet (25 mg total) by mouth 2 (two) times daily with a meal.      . enalapril (VASOTEC) 10 MG tablet Take 10 mg by mouth 2 (two) times daily.        . furosemide (LASIX) 40 MG tablet Take 1 tablet (40 mg total) by mouth daily.  90 tablet  3    Past Medical History  Diagnosis Date  . Essential hypertension, benign   . CAD (coronary artery disease)     NONOBSTRUCTIVE  a. cath 8/10: prox to mid LAD 20%  . NICM (nonischemic cardiomyopathy)     a. echo 8/10: EF20-25%, mod LAE, severe asymmetric septal hypertrophy (? nonobs. HCM); grade 1 diast dyfxn  . Chronic systolic heart failure   . CKD (chronic kidney disease)     baseline creatinine approx 1.5  . Hypertrophic nonobstructive cardiomyopathy     septal hypertrophy without LVOT    No past surgical history on file.  ROS:  As stated in the HPI and negative for all other systems.  PHYSICAL EXAM BP 144/92  Pulse 76  Ht 6' (1.829 m)  Wt 253 lb (114.76 kg)  BMI 34.31 kg/m2 GENERAL:  Well appearing HEENT:  Pupils equal round and reactive, fundi not  visualized, oral mucosa unremarkable NECK:  No jugular venous distention, waveform within normal limits, carotid upstroke brisk and symmetric, no bruits, no thyromegaly LYMPHATICS:  No cervical, inguinal adenopathy LUNGS:  Clear to auscultation bilaterally BACK:  No CVA tenderness CHEST:  Unremarkable HEART:  PMI not displaced or sustained,S1 and S2 within normal limits, no S3, no S4, no clicks, no rubs, no murmurs ABD:  Flat, positive bowel sounds normal in frequency in pitch, no bruits, no rebound, no guarding, no midline pulsatile mass, no hepatomegaly, no splenomegaly EXT:  2 plus pulses throughout, no edema, no cyanosis no clubbing SKIN:  No rashes no nodules NEURO:  Cranial nerves II through XII grossly intact, motor grossly intact throughout PSYCH:  Cognitively intact, oriented to person place and time  ASSESSMENT AND PLAN

## 2011-07-23 ENCOUNTER — Other Ambulatory Visit: Payer: Self-pay | Admitting: Cardiology

## 2011-11-11 ENCOUNTER — Emergency Department (HOSPITAL_COMMUNITY): Payer: Self-pay

## 2011-11-11 ENCOUNTER — Encounter (HOSPITAL_COMMUNITY): Payer: Self-pay | Admitting: *Deleted

## 2011-11-11 ENCOUNTER — Emergency Department (HOSPITAL_COMMUNITY)
Admission: EM | Admit: 2011-11-11 | Discharge: 2011-11-11 | Disposition: A | Discharge status: Home / Self Care | Payer: Self-pay | Attending: Emergency Medicine | Admitting: Emergency Medicine

## 2011-11-11 DIAGNOSIS — Z79899 Other long term (current) drug therapy: Secondary | ICD-10-CM | POA: Insufficient documentation

## 2011-11-11 DIAGNOSIS — M25579 Pain in unspecified ankle and joints of unspecified foot: Secondary | ICD-10-CM | POA: Insufficient documentation

## 2011-11-11 DIAGNOSIS — Z7982 Long term (current) use of aspirin: Secondary | ICD-10-CM | POA: Insufficient documentation

## 2011-11-11 DIAGNOSIS — I5022 Chronic systolic (congestive) heart failure: Secondary | ICD-10-CM | POA: Insufficient documentation

## 2011-11-11 DIAGNOSIS — M109 Gout, unspecified: Secondary | ICD-10-CM | POA: Insufficient documentation

## 2011-11-11 DIAGNOSIS — M25473 Effusion, unspecified ankle: Secondary | ICD-10-CM | POA: Insufficient documentation

## 2011-11-11 DIAGNOSIS — I129 Hypertensive chronic kidney disease with stage 1 through stage 4 chronic kidney disease, or unspecified chronic kidney disease: Secondary | ICD-10-CM | POA: Insufficient documentation

## 2011-11-11 DIAGNOSIS — M25476 Effusion, unspecified foot: Secondary | ICD-10-CM | POA: Insufficient documentation

## 2011-11-11 DIAGNOSIS — M79609 Pain in unspecified limb: Secondary | ICD-10-CM | POA: Insufficient documentation

## 2011-11-11 DIAGNOSIS — N189 Chronic kidney disease, unspecified: Secondary | ICD-10-CM | POA: Insufficient documentation

## 2011-11-11 DIAGNOSIS — I2510 Atherosclerotic heart disease of native coronary artery without angina pectoris: Secondary | ICD-10-CM | POA: Insufficient documentation

## 2011-11-11 DIAGNOSIS — M7989 Other specified soft tissue disorders: Secondary | ICD-10-CM | POA: Insufficient documentation

## 2011-11-11 MED ORDER — PREDNISONE 20 MG PO TABS: 40.0000 mg | ORAL_TABLET | Freq: Every day | ORAL | Status: AC

## 2011-11-11 MED ORDER — OXYCODONE-ACETAMINOPHEN 5-325 MG PO TABS
2.0000 | ORAL_TABLET | Freq: Once | ORAL | Status: AC
  Administered 2011-11-11: 2 via ORAL
  Filled 2011-11-11: qty 2

## 2011-11-11 MED ORDER — PREDNISONE 20 MG PO TABS
60.0000 mg | ORAL_TABLET | Freq: Once | ORAL | Status: AC
  Administered 2011-11-11: 60 mg via ORAL
  Filled 2011-11-11: qty 3

## 2011-11-11 MED ORDER — OXYCODONE-ACETAMINOPHEN 5-325 MG PO TABS: 2.0000 | ORAL_TABLET | ORAL | Status: AC | PRN

## 2011-11-11 NOTE — ED Notes (Signed)
Pt has pain and swelling to left ankle and foot.  States that the pain began in ankle and now in foot and toes

## 2011-11-11 NOTE — ED Notes (Signed)
Pt to ED for eval of L foot swelling/pain; pt denies injury, but reports that 4 days ago he started to have swelling to ankle/foot and pain that has radiated down to toes; pt has swelling to L ankle and foot; pt able to move toes but with pain; pt has redness to top to foot and to L lateral side of ankle

## 2011-11-11 NOTE — ED Notes (Signed)
Pt has had increasing pain and swelling to left foot for 3-4days.  Not associated with any trauma.

## 2011-11-11 NOTE — ED Provider Notes (Signed)
History     CSN: [id number] Arrival date & time: 11/11/2011  6:49 PM   First MD Initiated Contact with Patient 11/11/11 2039      Chief Complaint  Patient presents with  . Foot Pain    (Consider location/radiation/quality/duration/timing/severity/associated sxs/prior treatment) HPI Comments: The patient presents for evaluation of left ankle and foot pain and swelling that's been present for approximately 3-4 days. He reports that the pain is described as aching, is moderate to severe in intensity, is nonradiating, is worsened by standing, walking, palpation, movement, and is improved by rest of the extremity and elevation. He denies any known injury to the ankle or foot preceding the pain. He has had similar symptoms come and go in the past but has never been diagnosed with gout. He denies any numbness or weakness of the lower extremity and denies any calf pain or tenderness or swelling. The patient has an examination consistent with gout.  Patient is a 55 y.o. male presenting with lower extremity pain. The history is provided by the patient.  Foot Pain This is a new problem. The current episode started more than 2 days ago (3-4 days ago). The problem occurs constantly. The problem has been gradually worsening. Pertinent negatives include no chest pain, no abdominal pain, no headaches and no shortness of breath. The symptoms are aggravated by walking, twisting and standing. The symptoms are relieved by relaxation and position (elevation). He has tried nothing for the symptoms.    Past Medical History  Diagnosis Date  . Essential hypertension, benign   . CAD (coronary artery disease)     NONOBSTRUCTIVE  a. cath 8/10: prox to mid LAD 20%  . NICM (nonischemic cardiomyopathy)     a. echo 8/10: EF20-25%, mod LAE, severe asymmetric septal hypertrophy (? nonobs. HCM); grade 1 diast dyfxn.  EF 55% echo June 6  . Chronic systolic heart failure   . CKD (chronic kidney disease)     baseline  creatinine approx 1.5  . Hypertrophic nonobstructive cardiomyopathy     septal hypertrophy without LVOT    History reviewed. No pertinent past surgical history.  Family History  Problem Relation Age of Onset  . Kidney disease Brother   . Kidney disease Father   . Diabetes Mother   . Heart attack Mother     History  Substance Use Topics  . Smoking status: Never Smoker   . Smokeless tobacco: Not on file  . Alcohol Use: Yes      Review of Systems  Constitutional: Negative for fever, chills, activity change, appetite change and fatigue.  HENT: Negative.   Eyes: Negative.   Respiratory: Negative for shortness of breath.   Cardiovascular: Negative for chest pain and leg swelling.  Gastrointestinal: Negative for nausea, vomiting, abdominal pain and diarrhea.  Genitourinary: Negative.   Musculoskeletal: Positive for joint swelling and arthralgias. Negative for back pain and gait problem.  Skin: Negative for color change, pallor, rash and wound.  Neurological: Negative for weakness, numbness and headaches.  Hematological: Negative for adenopathy. Does not bruise/bleed easily.  Psychiatric/Behavioral: Negative.     Allergies  Review of patient's allergies indicates no known allergies.  Home Medications   Current Outpatient Rx  Name Route Sig Dispense Refill  . AMLODIPINE BESYLATE 10 MG PO TABS Oral Take 1 tablet (10 mg total) by mouth daily. 90 tablet 3  . ASPIRIN 81 MG PO TABS Oral Take 81 mg by mouth daily.      Marland Kitchen VITAMIN B COMPLEX PO Oral  Take 1 tablet by mouth daily.      Marland Kitchen BIOTIN PO Oral Take 1 tablet by mouth daily.      Marland Kitchen CARVEDILOL 25 MG PO TABS  TAKE 2 TABLETS TWICE A DAY 120 tablet 6  . CINNAMON PO Oral Take 1 tablet by mouth daily.      Marland Kitchen VITAMIN B-12 PO Oral Take 1 tablet by mouth daily.      . ENALAPRIL MALEATE 10 MG PO TABS Oral Take 10 mg by mouth 2 (two) times daily.      Marland Kitchen FERROUS SULFATE 325 (65 FE) MG PO TABS Oral Take 325 mg by mouth daily.      .  FUROSEMIDE 40 MG PO TABS Oral Take 1 tablet (40 mg total) by mouth daily. 90 tablet 3  . GARLIC PO TABS Oral Take 1 tablet by mouth daily.      Marland Kitchen MAGNESIUM OXIDE PO Oral Take 1 tablet by mouth daily.      Marland Kitchen POTASSIUM GLUCONATE PO Oral Take 1 tablet by mouth daily.      Marland Kitchen VITAMIN A PO Oral Take 1 tablet by mouth daily.        BP 150/93  Pulse 80  Resp 16  SpO2 100%  Physical Exam  Nursing note and vitals reviewed. Constitutional: He is oriented to person, place, and time. He appears well-developed and well-nourished. No distress.  HENT:  Head: Normocephalic and atraumatic.  Eyes: EOM are normal. Pupils are equal, round, and reactive to light.  Neck: Normal range of motion. Neck supple.  Cardiovascular: Normal rate and regular rhythm.   Pulmonary/Chest: Effort normal.  Musculoskeletal: He exhibits edema and tenderness.       Left ankle: He exhibits decreased range of motion and swelling. He exhibits no ecchymosis, no deformity, no laceration and normal pulse. tenderness. Lateral malleolus, medial malleolus and AITFL tenderness found. No CF ligament, no posterior TFL, no head of 5th metatarsal and no proximal fibula tenderness found. Achilles tendon normal.       Left foot: He exhibits decreased range of motion, tenderness, bony tenderness and swelling. He exhibits normal capillary refill, no crepitus, no deformity and no laceration.       Feet:  Neurological: He is alert and oriented to person, place, and time. He has normal reflexes. He exhibits normal muscle tone.  Skin: Skin is warm and dry. No rash noted. No erythema. No pallor.  Psychiatric: He has a normal mood and affect. His behavior is normal.    ED Course  Procedures (including critical care time)  Labs Reviewed - No data to display Dg Ankle Complete Left  11/11/2011  *RADIOLOGY REPORT*  Clinical Data: Foot and ankle pain.  LEFT ANKLE COMPLETE - 3+ VIEW  Comparison: None.  Findings: Diffuse soft tissue swelling is present  about the ankle. The ankle joint is intact.  Calcaneal spurs are noted.  IMPRESSION:  1.  Diffuse soft tissue swelling about the ankle without underlying fracture or subluxation. 2.  Calcaneal spurs.  Original Report Authenticated By: Jamesetta Orleans. MATTERN, M.D.   Dg Foot Complete Left  11/11/2011  *RADIOLOGY REPORT*  Clinical Data: Left foot pain.  LEFT FOOT - COMPLETE 3+ VIEW  Comparison: None available.  Findings: No acute bone or soft tissue abnormality is present. Degenerative changes are noted at the base of the fifth metatarsal. Calcaneal spurs are noted.  IMPRESSION:  1.  No acute osseous abnormality. 2.  Mild degenerative changes at the base of the fifth  metatarsal and calcaneus.  Original Report Authenticated By: Jamesetta Orleans. MATTERN, M.D.     No diagnosis found.    MDM  The patient has had no recent injury to the left foot or ankle and on review of the x-rays, there is no acute osseous injury noted. Findings are compatible with gouty arthritis. I will treat him with pain medication and steroids to decrease the inflammation and have otherwise encouraged him to increase his oral fluid intake. I will discharge patient home for outpatient management.      Felisa Bonier, MD 11/11/11 2118

## 2011-12-17 ENCOUNTER — Other Ambulatory Visit: Payer: Self-pay | Admitting: Physician Assistant

## 2012-01-14 ENCOUNTER — Emergency Department (HOSPITAL_COMMUNITY): Payer: Self-pay

## 2012-01-14 ENCOUNTER — Emergency Department (HOSPITAL_COMMUNITY)
Admission: EM | Admit: 2012-01-14 | Discharge: 2012-01-14 | Disposition: A | Discharge status: Home / Self Care | Payer: Self-pay | Attending: Emergency Medicine | Admitting: Emergency Medicine

## 2012-01-14 ENCOUNTER — Encounter (HOSPITAL_COMMUNITY): Payer: Self-pay | Admitting: Emergency Medicine

## 2012-01-14 DIAGNOSIS — Z79899 Other long term (current) drug therapy: Secondary | ICD-10-CM | POA: Insufficient documentation

## 2012-01-14 DIAGNOSIS — I2510 Atherosclerotic heart disease of native coronary artery without angina pectoris: Secondary | ICD-10-CM | POA: Insufficient documentation

## 2012-01-14 DIAGNOSIS — R0609 Other forms of dyspnea: Secondary | ICD-10-CM | POA: Insufficient documentation

## 2012-01-14 DIAGNOSIS — I129 Hypertensive chronic kidney disease with stage 1 through stage 4 chronic kidney disease, or unspecified chronic kidney disease: Secondary | ICD-10-CM | POA: Insufficient documentation

## 2012-01-14 DIAGNOSIS — R0602 Shortness of breath: Secondary | ICD-10-CM | POA: Insufficient documentation

## 2012-01-14 DIAGNOSIS — R0600 Dyspnea, unspecified: Secondary | ICD-10-CM

## 2012-01-14 DIAGNOSIS — R0989 Other specified symptoms and signs involving the circulatory and respiratory systems: Secondary | ICD-10-CM | POA: Insufficient documentation

## 2012-01-14 DIAGNOSIS — N189 Chronic kidney disease, unspecified: Secondary | ICD-10-CM | POA: Insufficient documentation

## 2012-01-14 LAB — BASIC METABOLIC PANEL
BUN: 30 mg/dL — ABNORMAL HIGH (ref 6–23)
CO2: 27 mEq/L (ref 19–32)
Calcium: 9.9 mg/dL (ref 8.4–10.5)
Chloride: 100 mEq/L (ref 96–112)
Creatinine, Ser: 1.33 mg/dL (ref 0.50–1.35)
GFR calc Af Amer: 68 mL/min — ABNORMAL LOW (ref >90)
GFR calc non Af Amer: 58 mL/min — ABNORMAL LOW (ref >90)
Glucose, Bld: 96 mg/dL (ref 70–99)
Potassium: 4 mEq/L (ref 3.5–5.1)
Sodium: 136 mEq/L (ref 135–145)

## 2012-01-14 LAB — DIFFERENTIAL
Basophils Absolute: 0 10*3/uL (ref 0.0–0.1)
Basophils Relative: 1 % (ref 0–1)
Eosinophils Absolute: 0.3 10*3/uL (ref 0.0–0.7)
Eosinophils Relative: 4 % (ref 0–5)
Lymphocytes Relative: 20 % (ref 12–46)
Lymphs Abs: 1.6 10*3/uL (ref 0.7–4.0)
Monocytes Absolute: 0.6 10*3/uL (ref 0.1–1.0)
Monocytes Relative: 7 % (ref 3–12)
Neutro Abs: 5.5 10*3/uL (ref 1.7–7.7)
Neutrophils Relative %: 68 % (ref 43–77)

## 2012-01-14 LAB — CBC
HCT: 36.9 % — ABNORMAL LOW (ref 39.0–52.0)
Hemoglobin: 11.9 g/dL — ABNORMAL LOW (ref 13.0–17.0)
MCH: 21.6 pg — ABNORMAL LOW (ref 26.0–34.0)
MCHC: 32.2 g/dL (ref 30.0–36.0)
MCV: 66.8 fL — ABNORMAL LOW (ref 78.0–100.0)
Platelets: 206 10*3/uL (ref 150–400)
RBC: 5.52 MIL/uL (ref 4.22–5.81)
RDW: 17.9 % — ABNORMAL HIGH (ref 11.5–15.5)
WBC: 8.1 10*3/uL (ref 4.0–10.5)

## 2012-01-14 NOTE — ED Provider Notes (Signed)
History     CSN: [id number]  Arrival date & time 01/14/12  1512   First MD Initiated Contact with Patient 01/14/12 1759      Chief Complaint  Patient presents with  . Shortness of Breath    (Consider location/radiation/quality/duration/timing/severity/associated sxs/prior treatment) Patient is a 56 y.o. male presenting with shortness of breath. The history is provided by the patient (The patient states he coughed several times in the right ovary fast and became short of breath for short period time no chest pain no fever nonproductive cough patient feels fine now). No language interpreter was used.  Shortness of Breath  The current episode started today. The problem occurs rarely. The problem has been resolved. The problem is moderate. The symptoms are relieved by nothing. Exacerbated by: coughing. Associated symptoms include shortness of breath. Pertinent negatives include no chest pain, no cough and no wheezing. The Heimlich maneuver was not attempted. He was not exposed to toxic fumes. He has had no prior steroid use. His past medical history does not include past wheezing. Urine output has been normal.    Past Medical History  Diagnosis Date  . Essential hypertension, benign   . CAD (coronary artery disease)     NONOBSTRUCTIVE  a. cath 8/10: prox to mid LAD 20%  . NICM (nonischemic cardiomyopathy)     a. echo 8/10: EF20-25%, mod LAE, severe asymmetric septal hypertrophy (? nonobs. HCM); grade 1 diast dyfxn.  EF 55% echo June 6  . Chronic systolic heart failure   . CKD (chronic kidney disease)     baseline creatinine approx 1.5  . Hypertrophic nonobstructive cardiomyopathy     septal hypertrophy without LVOT    History reviewed. No pertinent past surgical history.  Family History  Problem Relation Age of Onset  . Kidney disease Brother   . Kidney disease Father   . Diabetes Mother   . Heart attack Mother     History  Substance Use Topics  . Smoking status: Never  Smoker   . Smokeless tobacco: Not on file  . Alcohol Use: Yes      Review of Systems  Constitutional: Negative for fatigue.  HENT: Negative for congestion, sinus pressure and ear discharge.   Eyes: Negative for discharge.  Respiratory: Positive for shortness of breath. Negative for cough and wheezing.   Cardiovascular: Negative for chest pain.  Gastrointestinal: Negative for abdominal pain and diarrhea.  Genitourinary: Negative for frequency and hematuria.  Musculoskeletal: Negative for back pain.  Skin: Negative for rash.  Neurological: Negative for seizures and headaches.  Hematological: Negative.   Psychiatric/Behavioral: Negative for hallucinations.    Allergies  Review of patient's allergies indicates no known allergies.  Home Medications   Current Outpatient Rx  Name Route Sig Dispense Refill  . AMLODIPINE BESYLATE 10 MG PO TABS Oral Take 1 tablet (10 mg total) by mouth daily. 90 tablet 3  . ASPIRIN 81 MG PO TABS Oral Take 81 mg by mouth daily.      Marland Kitchen VITAMIN B COMPLEX PO Oral Take 1 tablet by mouth daily.      Marland Kitchen BIOTIN PO Oral Take 1 tablet by mouth daily.      Marland Kitchen CARVEDILOL 25 MG PO TABS Oral Take 50 mg by mouth 2 (two) times daily with a meal.    . VITAMIN D-3 PO Oral Take 1 tablet by mouth daily.    Marland Kitchen CINNAMON PO Oral Take 1 tablet by mouth daily.      Marland Kitchen VITAMIN B-12  PO Oral Take 1 tablet by mouth daily.      . ENALAPRIL MALEATE 20 MG PO TABS Oral Take 20 mg by mouth 2 (two) times daily.    Marland Kitchen FERROUS SULFATE 325 (65 FE) MG PO TABS Oral Take 325 mg by mouth daily.      . FUROSEMIDE 40 MG PO TABS Oral Take 1 tablet (40 mg total) by mouth daily. 90 tablet 3  . GARLIC PO TABS Oral Take 1 tablet by mouth daily.      . L-ARGININE PO Oral Take 1 tablet by mouth daily.    Marland Kitchen MAGNESIUM OXIDE PO Oral Take 1 tablet by mouth daily.      Marland Kitchen NIACIN PO Oral Take 1 tablet by mouth daily.    Marland Kitchen POTASSIUM GLUCONATE PO Oral Take 1 tablet by mouth daily.        BP 145/93  Pulse 79   Temp(Src) 98.2 F (36.8 C) (Oral)  Resp 22  SpO2 97%  Physical Exam  Constitutional: He is oriented to person, place, and time. He appears well-developed.  HENT:  Head: Normocephalic and atraumatic.  Eyes: Conjunctivae and EOM are normal. No scleral icterus.  Neck: Neck supple. No thyromegaly present.  Cardiovascular: Normal rate and regular rhythm.  Exam reveals no gallop and no friction rub.   No murmur heard. Pulmonary/Chest: No stridor. He has no wheezes. He has no rales. He exhibits no tenderness.  Abdominal: He exhibits no distension. There is no tenderness. There is no rebound.  Musculoskeletal: Normal range of motion. He exhibits no edema.  Lymphadenopathy:    He has no cervical adenopathy.  Neurological: He is oriented to person, place, and time. Coordination normal.  Skin: No rash noted. No erythema.  Psychiatric: He has a normal mood and affect. His behavior is normal.    ED Course  Procedures (including critical care time)  Labs Reviewed  CBC - Abnormal; Notable for the following:    Hemoglobin 11.9 (*)    HCT 36.9 (*)    MCV 66.8 (*)    MCH 21.6 (*)    RDW 17.9 (*)    All other components within normal limits  BASIC METABOLIC PANEL - Abnormal; Notable for the following:    BUN 30 (*)    GFR calc non Af Amer 58 (*)    GFR calc Af Amer 68 (*)    All other components within normal limits  DIFFERENTIAL   Dg Chest 2 View  01/14/2012  *RADIOLOGY REPORT*  Clinical Data: Shortness of breath, hypertension  CHEST - 2 VIEW  Comparison: Chest x-ray of 07/26/2009  Findings: The lungs are clear.  Mild cardiomegaly is stable.  No bony abnormality is seen.  IMPRESSION: Stable cardiomegaly.  No active lung disease.  Original Report Authenticated By: Juline Patch, M.D.     1. Dyspnea       MDM  Patient without symptoms at the emergency department. The dyspnea was secondary to coughing significantly        Benny Lennert, MD 01/14/12 [phone]

## 2012-01-14 NOTE — ED Notes (Signed)
Has had a cold for a few days and started to cough at grocery store and then had severe sob

## 2012-03-08 ENCOUNTER — Other Ambulatory Visit: Payer: Self-pay | Admitting: Cardiology

## 2012-04-20 ENCOUNTER — Other Ambulatory Visit: Payer: Self-pay | Admitting: Cardiology

## 2012-04-20 NOTE — Telephone Encounter (Signed)
..   Requested Prescriptions   Pending Prescriptions Disp Refills  . carvedilol (COREG) 25 MG tablet [Pharmacy Med Name: CARVEDILOL 25MG  (TEVA)] 120 tablet 1    Sig: TAKE 2 TABLETS BY MOUTH TWICE A DAY  patient has appointment 6/21

## 2012-05-04 ENCOUNTER — Encounter (HOSPITAL_BASED_OUTPATIENT_CLINIC_OR_DEPARTMENT_OTHER): Payer: Self-pay | Admitting: *Deleted

## 2012-05-04 ENCOUNTER — Emergency Department (HOSPITAL_BASED_OUTPATIENT_CLINIC_OR_DEPARTMENT_OTHER): Payer: Self-pay

## 2012-05-04 ENCOUNTER — Emergency Department (HOSPITAL_BASED_OUTPATIENT_CLINIC_OR_DEPARTMENT_OTHER)
Admission: EM | Admit: 2012-05-04 | Discharge: 2012-05-04 | Disposition: A | Discharge status: Home / Self Care | Payer: Self-pay | Attending: Emergency Medicine | Admitting: Emergency Medicine

## 2012-05-04 DIAGNOSIS — Z79899 Other long term (current) drug therapy: Secondary | ICD-10-CM | POA: Insufficient documentation

## 2012-05-04 DIAGNOSIS — I2510 Atherosclerotic heart disease of native coronary artery without angina pectoris: Secondary | ICD-10-CM | POA: Insufficient documentation

## 2012-05-04 DIAGNOSIS — I5022 Chronic systolic (congestive) heart failure: Secondary | ICD-10-CM | POA: Insufficient documentation

## 2012-05-04 DIAGNOSIS — M109 Gout, unspecified: Secondary | ICD-10-CM | POA: Insufficient documentation

## 2012-05-04 DIAGNOSIS — N189 Chronic kidney disease, unspecified: Secondary | ICD-10-CM | POA: Insufficient documentation

## 2012-05-04 DIAGNOSIS — I129 Hypertensive chronic kidney disease with stage 1 through stage 4 chronic kidney disease, or unspecified chronic kidney disease: Secondary | ICD-10-CM | POA: Insufficient documentation

## 2012-05-04 HISTORY — DX: Gout, unspecified: M10.9

## 2012-05-04 MED ORDER — OXYCODONE-ACETAMINOPHEN 5-325 MG PO TABS: 1.0000 | ORAL_TABLET | Freq: Four times a day (QID) | ORAL | Status: AC | PRN

## 2012-05-04 MED ORDER — PREDNISONE 50 MG PO TABS
60.0000 mg | ORAL_TABLET | Freq: Once | ORAL | Status: AC
  Administered 2012-05-04: 60 mg via ORAL
  Filled 2012-05-04: qty 1

## 2012-05-04 MED ORDER — PREDNISONE 20 MG PO TABS: ORAL_TABLET | ORAL | Status: AC

## 2012-05-04 MED ORDER — OXYCODONE-ACETAMINOPHEN 5-325 MG PO TABS
2.0000 | ORAL_TABLET | Freq: Once | ORAL | Status: AC
  Administered 2012-05-04: 2 via ORAL
  Filled 2012-05-04: qty 2

## 2012-05-04 NOTE — ED Provider Notes (Signed)
History     CSN: [id number]  Arrival date & time 05/04/12  [place]   First MD Initiated Contact with Patient 05/04/12 1919      Chief Complaint  Patient presents with  . Foot Pain    (Consider location/radiation/quality/duration/timing/severity/associated sxs/prior treatment) HPI Comments: Hx of gout and states that this is similar  Patient is a 56 y.o. male presenting with lower extremity pain. The history is provided by the patient. No language interpreter was used.  Foot Pain This is a new problem. The current episode started 2 days ago. The problem occurs constantly. The problem has been gradually worsening. Pertinent negatives include no chest pain, no abdominal pain, no headaches and no shortness of breath. The symptoms are aggravated by walking and bending. The symptoms are relieved by nothing. Treatments tried: has tried pain meds and nsaids. The treatment provided mild relief.    Past Medical History  Diagnosis Date  . Essential hypertension, benign   . CAD (coronary artery disease)     NONOBSTRUCTIVE  a. cath 8/10: prox to mid LAD 20%  . NICM (nonischemic cardiomyopathy)     a. echo 8/10: EF20-25%, mod LAE, severe asymmetric septal hypertrophy (? nonobs. HCM); grade 1 diast dyfxn.  EF 55% echo June 6  . Chronic systolic heart failure   . CKD (chronic kidney disease)     baseline creatinine approx 1.5  . Hypertrophic nonobstructive cardiomyopathy     septal hypertrophy without LVOT  . Gout     History reviewed. No pertinent past surgical history.  Family History  Problem Relation Age of Onset  . Kidney disease Brother   . Kidney disease Father   . Diabetes Mother   . Heart attack Mother     History  Substance Use Topics  . Smoking status: Never Smoker   . Smokeless tobacco: Not on file  . Alcohol Use: No      Review of Systems  Constitutional: Negative for fever, chills, activity change, appetite change and fatigue.  HENT: Negative for congestion, sore  throat, rhinorrhea, neck pain and neck stiffness.   Respiratory: Negative for cough and shortness of breath.   Cardiovascular: Negative for chest pain and palpitations.  Gastrointestinal: Negative for nausea, vomiting and abdominal pain.  Genitourinary: Negative for dysuria, urgency, frequency and flank pain.  Musculoskeletal: Positive for joint swelling and arthralgias. Negative for myalgias and back pain.  Neurological: Negative for dizziness, weakness, light-headedness, numbness and headaches.  All other systems reviewed and are negative.    Allergies  Review of patient's allergies indicates no known allergies.  Home Medications   Current Outpatient Rx  Name Route Sig Dispense Refill  . AMLODIPINE BESYLATE 10 MG PO TABS  TAKE 1 TABLET ONCE A DAY 90 tablet 0  . ASPIRIN 81 MG PO TABS Oral Take 81 mg by mouth daily.      Marland Kitchen VITAMIN B COMPLEX PO Oral Take 1 tablet by mouth daily.      Marland Kitchen BIOTIN PO Oral Take 1 tablet by mouth daily.      Marland Kitchen CARVEDILOL 25 MG PO TABS  TAKE 2 TABLETS BY MOUTH TWICE A DAY 120 tablet 1  . CINNAMON PO Oral Take 1 tablet by mouth daily.      Marland Kitchen VITAMIN B-12 PO Oral Take 1 tablet by mouth daily.      . ENALAPRIL MALEATE 20 MG PO TABS Oral Take 20 mg by mouth 2 (two) times daily.    . FUROSEMIDE 40 MG PO TABS  TAKE 1 TABLET ONCE A DAY 90 tablet 0  . GARLIC PO TABS Oral Take 1 tablet by mouth daily.      . IBUPROFEN 200 MG PO TABS Oral Take 800 mg by mouth 3 (three) times daily as needed. For pain    . MAGNESIUM OXIDE PO Oral Take 1 tablet by mouth daily.      Terri Piedra JR EX LIQD Topical Apply 1 application topically daily as needed. For toe pain    . PERCOCET PO Oral Take 1 tablet by mouth every 4 (four) hours as needed. For pain    . POTASSIUM GLUCONATE PO Oral Take 1 tablet by mouth daily.      Marland Kitchen PRESCRIPTION MEDICATION Oral Take 1 tablet by mouth once as needed. Pain medication    . PROBIOTIC FORMULA PO Oral Take 1-3 tablets by mouth daily.    Marland Kitchen VITAMIN D-3  PO Oral Take 1 tablet by mouth daily.    . OXYCODONE-ACETAMINOPHEN 5-325 MG PO TABS Oral Take 1-2 tablets by mouth every 6 (six) hours as needed for pain. 20 tablet 0  . PREDNISONE 20 MG PO TABS  3 Tabs PO Days 1-3, then 2 tabs PO Days 4-6, then 1 tab PO Day 7-9, then Half Tab PO Day 10-12 20 tablet 0    BP 140/89  Pulse 74  Temp(Src) 98.6 F (37 C) (Oral)  Resp 20  SpO2 98%  Physical Exam  Nursing note and vitals reviewed. Constitutional: He is oriented to person, place, and time. He appears well-developed and well-nourished. No distress.  HENT:  Head: Normocephalic and atraumatic.  Mouth/Throat: Oropharynx is clear and moist.  Eyes: Conjunctivae and EOM are normal. Pupils are equal, round, and reactive to light.  Neck: Normal range of motion. Neck supple.  Cardiovascular: Normal rate, regular rhythm, normal heart sounds and intact distal pulses.  Exam reveals no gallop and no friction rub.   No murmur heard. Pulmonary/Chest: Effort normal and breath sounds normal. No respiratory distress. He exhibits no tenderness.  Abdominal: Soft. Bowel sounds are normal. There is no tenderness. There is no rebound and no guarding.  Musculoskeletal: He exhibits tenderness. He exhibits no edema.       Left foot: He exhibits tenderness, bony tenderness and swelling. He exhibits normal capillary refill and no deformity.       Feet:  Neurological: He is alert and oriented to person, place, and time. No cranial nerve deficit.  Skin: Skin is warm and dry. No rash noted.    ED Course  Procedures (including critical care time)  Labs Reviewed - No data to display Dg Foot Complete Right  05/04/2012  *RADIOLOGY REPORT*  Clinical Data:  Right foot pain for 4 days, no known injury, history gout  RIGHT FOOT COMPLETE - 3+ VIEW  Comparison: None  Findings: Osseous mineralization normal. Degenerative changes at IP joint great toe. Remaining joint spaces preserved. No acute fracture, dislocation, or bone  destruction. Diffuse soft tissue swelling right foot. Tiny plantar calcaneal spur.  IMPRESSION: Degenerative changes IP joint great toe. Diffuse soft tissue swelling. No additional focal bony abnormalities identified.  Original Report Authenticated By: Lollie Marrow, M.D.     1. Podagra   2. Gout       MDM  Podagra of the right great toe IP joint. This is secondary gout. He'll be prescribed prednisone and Percocet. I did not prescribe culture seen secondary to financial situation. Recommended followup with her primary care physician in the next  week as gout is an ongoing chronic illness. I have no concern about infectious process         Dayton Bailiff, MD 05/04/12 [phone]

## 2012-05-04 NOTE — ED Notes (Signed)
Right foot pain x 4 days. Red, hot, swollen, painful x 2 days. No known injury. States he thinks he has gout.

## 2012-05-04 NOTE — ED Notes (Signed)
Rx x 2 given for percocet and prednisone- pt states his wife will drive him home

## 2012-05-04 NOTE — ED Notes (Signed)
Patient transported to X-ray 

## 2012-05-04 NOTE — Discharge Instructions (Signed)
Gout Gout is an inflammatory condition (arthritis) caused by a buildup of uric acid crystals in the joints. Uric acid is a chemical that is normally present in the blood. Under some circumstances, uric acid can form into crystals in your joints. This causes joint redness, soreness, and swelling (inflammation). Repeat attacks are common. Over time, uric acid crystals can form into masses (tophi) near a joint, causing disfigurement. Gout is treatable and often preventable. CAUSES  The disease begins with elevated levels of uric acid in the blood. Uric acid is produced by your body when it breaks down a naturally found substance called purines. This also happens when you eat certain foods such as meats and fish. Causes of an elevated uric acid level include:  Being passed down from parent to child (heredity).   Diseases that cause increased uric acid production (obesity, psoriasis, some cancers).   Excessive alcohol use.   Diet, especially diets rich in meat and seafood.   Medicines, including certain cancer-fighting drugs (chemotherapy), diuretics, and aspirin.   Chronic kidney disease. The kidneys are no longer able to remove uric acid well.   Problems with metabolism.  Conditions strongly associated with gout include:  Obesity.   High blood pressure.   High cholesterol.   Diabetes.  Not everyone with elevated uric acid levels gets gout. It is not understood why some people get gout and others do not. Surgery, joint injury, and eating too much of certain foods are some of the factors that can lead to gout. SYMPTOMS   An attack of gout comes on quickly. It causes intense pain with redness, swelling, and warmth in a joint.   Fever can occur.   Often, only one joint is involved. Certain joints are more commonly involved:   Base of the big toe.   Knee.   Ankle.   Wrist.   Finger.  Without treatment, an attack usually goes away in a few days to weeks. Between attacks, you  usually will not have symptoms, which is different from many other forms of arthritis. DIAGNOSIS  Your caregiver will suspect gout based on your symptoms and exam. Removal of fluid from the joint (arthrocentesis) is done to check for uric acid crystals. Your caregiver will give you a medicine that numbs the area (local anesthetic) and use a needle to remove joint fluid for exam. Gout is confirmed when uric acid crystals are seen in joint fluid, using a special microscope. Sometimes, blood, urine, and X-ray tests are also used. TREATMENT  There are 2 phases to gout treatment: treating the sudden onset (acute) attack and preventing attacks (prophylaxis). Treatment of an Acute Attack  Medicines are used. These include anti-inflammatory medicines or steroid medicines.   An injection of steroid medicine into the affected joint is sometimes necessary.   The painful joint is rested. Movement can worsen the arthritis.   You may use warm or cold treatments on painful joints, depending which works best for you.   Discuss the use of coffee, vitamin C, or cherries with your caregiver. These may be helpful treatment options.  Treatment to Prevent Attacks After the acute attack subsides, your caregiver may advise prophylactic medicine. These medicines either help your kidneys eliminate uric acid from your body or decrease your uric acid production. You may need to stay on these medicines for a very long time. The early phase of treatment with prophylactic medicine can be associated with an increase in acute gout attacks. For this reason, during the first few months   of treatment, your caregiver may also advise you to take medicines usually used for acute gout treatment. Be sure you understand your caregiver's directions. You should also discuss dietary treatment with your caregiver. Certain foods such as meats and fish can increase uric acid levels. Other foods such as dairy can decrease levels. Your caregiver  can give you a list of foods to avoid. HOME CARE INSTRUCTIONS   Do not take aspirin to relieve pain. This raises uric acid levels.   Only take over-the-counter or prescription medicines for pain, discomfort, or fever as directed by your caregiver.   Rest the joint as much as possible. When in bed, keep sheets and blankets off painful areas.   Keep the affected joint raised (elevated).   Use crutches if the painful joint is in your leg.   Drink enough water and fluids to keep your urine clear or pale yellow. This helps your body get rid of uric acid. Do not drink alcoholic beverages. They slow the passage of uric acid.   Follow your caregiver's dietary instructions. Pay careful attention to the amount of protein you eat. Your daily diet should emphasize fruits, vegetables, whole grains, and fat-free or low-fat milk products.   Maintain a healthy body weight.  SEEK MEDICAL CARE IF:   You have an oral temperature above 102 F (38.9 C).   You develop diarrhea, vomiting, or any side effects from medicines.   You do not feel better in 24 hours, or you are getting worse.  SEEK IMMEDIATE MEDICAL CARE IF:   Your joint becomes suddenly more tender and you have:   Chills.   An oral temperature above 102 F (38.9 C), not controlled by medicine.  MAKE SURE YOU:   Understand these instructions.   Will watch your condition.   Will get help right away if you are not doing well or get worse.  Document Released: 11/12/2000 Document Revised: 11/04/2011 Document Reviewed: 02/23/2010 [employer] Patient Information 2012 [employer]. 

## 2012-05-19 ENCOUNTER — Encounter: Payer: Self-pay | Admitting: Cardiology

## 2012-05-19 ENCOUNTER — Ambulatory Visit (INDEPENDENT_AMBULATORY_CARE_PROVIDER_SITE_OTHER): Payer: Self-pay | Admitting: Cardiology

## 2012-05-19 VITALS — BP 140/95 | HR 68 | Ht 72.0 in | Wt 262.8 lb

## 2012-05-19 DIAGNOSIS — Z7689 Persons encountering health services in other specified circumstances: Secondary | ICD-10-CM

## 2012-05-19 DIAGNOSIS — I428 Other cardiomyopathies: Secondary | ICD-10-CM

## 2012-05-19 DIAGNOSIS — E669 Obesity, unspecified: Secondary | ICD-10-CM

## 2012-05-19 DIAGNOSIS — Z7189 Other specified counseling: Secondary | ICD-10-CM

## 2012-05-19 DIAGNOSIS — G4700 Insomnia, unspecified: Secondary | ICD-10-CM | POA: Insufficient documentation

## 2012-05-19 DIAGNOSIS — I5022 Chronic systolic (congestive) heart failure: Secondary | ICD-10-CM

## 2012-05-19 DIAGNOSIS — I10 Essential (primary) hypertension: Secondary | ICD-10-CM

## 2012-05-19 MED ORDER — ZOLPIDEM TARTRATE 10 MG PO TABS: 10.0000 mg | ORAL_TABLET | Freq: Every evening | ORAL | Status: DC | PRN

## 2012-05-19 NOTE — Assessment & Plan Note (Signed)
I would have no reason to suspect that his ejection fraction is lower again. No further echo is indicated at this visit.

## 2012-05-19 NOTE — Assessment & Plan Note (Signed)
He describes this.  I have given him a limited prescription for Ambien.  I have also referred him for a new primary care doctor to further evaluate and manage this.

## 2012-05-19 NOTE — Patient Instructions (Addendum)
Continue medications as listed  Follow up in 1 year with Dr Antoine Poche.  You will receive a letter in the mail 2 months before you are due.  Please call us when you receive this letter to schedule your follow up appointment.

## 2012-05-19 NOTE — Addendum Note (Signed)
Addended by: Sharin Grave on: 05/19/2012 02:22 PM   Modules accepted: Orders

## 2012-05-19 NOTE — Assessment & Plan Note (Signed)
His blood pressure is mildly elevated today.  I have instructed the patient to record a blood pressure diary and recording this. This will be presented for my review and pending these results I will make further suggestions about changes in therapy for optimal blood pressure control.

## 2012-05-19 NOTE — Progress Notes (Signed)
Re: he is a HPI The patient presents for followup of his hypertension and cardiomyopathy. He has a nonischemic cardiomyopathy with an original ejection fraction of 25% to 50% on followup in June of last year. Since last being seen he has no acute cardiac complaints. He doesn't exercise because he is fatigue. He denies any chest pressure, neck or arm discomfort. He doesn't have any palpitations, presyncope or syncope. He denies any PND or orthopnea. He has gained weight because he does exercise. He says he thinks his fatigue is related to his inability to sleep. He describes insomnia.  No Known Allergies  Current Outpatient Prescriptions  Medication Sig Dispense Refill  . amLODipine (NORVASC) 10 MG tablet TAKE 1 TABLET ONCE A DAY  90 tablet  0  . aspirin 81 MG tablet Take 81 mg by mouth daily.        . B Complex Vitamins (VITAMIN B COMPLEX PO) Take 1 tablet by mouth daily.        Marland Kitchen BIOTIN PO Take 1 tablet by mouth daily.        . carvedilol (COREG) 25 MG tablet TAKE 2 TABLETS BY MOUTH TWICE A DAY  120 tablet  1  . Cholecalciferol (VITAMIN D-3 PO) Take 1 tablet by mouth daily.      Marland Kitchen CINNAMON PO Take 1 tablet by mouth daily.        . Cyanocobalamin (VITAMIN B-12 PO) Take 1 tablet by mouth daily.        . furosemide (LASIX) 40 MG tablet TAKE 1 TABLET ONCE A DAY  90 tablet  0  . Garlic TABS Take 1 tablet by mouth daily.        Marland Kitchen ibuprofen (ADVIL,MOTRIN) 200 MG tablet Take 800 mg by mouth 3 (three) times daily as needed. For pain      . MAGNESIUM OXIDE PO Take 1 tablet by mouth daily.        Marland Kitchen menthol-thymol (ABSORBINE JR) LIQD Apply 1 application topically daily as needed. For toe pain      . Oxycodone-Acetaminophen (PERCOCET PO) Take 1 tablet by mouth every 4 (four) hours as needed. For pain      . POTASSIUM GLUCONATE PO Take 1 tablet by mouth daily.        . Probiotic Product (PROBIOTIC FORMULA PO) Take 1-3 tablets by mouth daily.      Marland Kitchen zolpidem (AMBIEN) 10 MG tablet Take 1 tablet (10 mg  total) by mouth at bedtime as needed for sleep.  30 tablet  0    Past Medical History  Diagnosis Date  . Essential hypertension, benign   . CAD (coronary artery disease)     NONOBSTRUCTIVE  a. cath 8/10: prox to mid LAD 20%  . NICM (nonischemic cardiomyopathy)     a. echo 8/10: EF20-25%, mod LAE, severe asymmetric septal hypertrophy (? nonobs. HCM); grade 1 diast dyfxn.  EF 55% echo June 6  . Chronic systolic heart failure   . CKD (chronic kidney disease)     baseline creatinine approx 1.5  . Hypertrophic nonobstructive cardiomyopathy     septal hypertrophy without LVOT  . Gout     Past Surgical History  Procedure Date  . Knee arthroscopy     right    ROS:  Insomnia.  s stated in the HPI and negative for all other systems.  PHYSICAL EXAM BP 140/95  Pulse 68  Ht 6' (1.829 m)  Wt 262 lb 12.8 oz (119.205 kg)  BMI 35.64 kg/m2  GENERAL:  Well appearing HEENT:  Pupils equal round and reactive, fundi not visualized, oral mucosa unremarkable NECK:  No jugular venous distention, waveform within normal limits, carotid upstroke brisk and symmetric, no bruits, no thyromegaly LYMPHATICS:  No cervical, inguinal adenopathy LUNGS:  Clear to auscultation bilaterally BACK:  No CVA tenderness CHEST:  Unremarkable HEART:  PMI not displaced or sustained,S1 and S2 within normal limits, no S3, no S4, no clicks, no rubs, no murmurs ABD:  Flat, positive bowel sounds normal in frequency in pitch, no bruits, no rebound, no guarding, no midline pulsatile mass, no hepatomegaly, no splenomegaly EXT:  2 plus pulses throughout, no edema, no cyanosis no clubbing SKIN:  No rashes no nodules NEURO:  Cranial nerves II through XII grossly intact, motor grossly intact throughout PSYCH:  Cognitively intact, oriented to person place and time  EKG:  Sinus rhythm, rate 68, leftward axis, anterior lateral T-wave inversions unchanged from previous. 05/19/2012  ASSESSMENT AND PLAN

## 2012-05-19 NOTE — Assessment & Plan Note (Signed)
The patient understands the need to lose weight with diet and exercise. We have discussed specific strategies for this.  

## 2012-05-25 ENCOUNTER — Telehealth: Payer: Self-pay | Admitting: *Deleted

## 2012-05-25 NOTE — Telephone Encounter (Signed)
I informed the patient that [facility] requires $125-$180, up-front for patient without insurance. Patient will call me back after he decided on what to do.

## 2012-06-23 ENCOUNTER — Other Ambulatory Visit: Payer: Self-pay | Admitting: Cardiology

## 2012-06-26 NOTE — Telephone Encounter (Signed)
..   Requested Prescriptions   Pending Prescriptions Disp Refills  . amLODipine (NORVASC) 10 MG tablet [Pharmacy Med Name: AMLODIPINE BESYLATE 10MG  (ZY)] 90 tablet 3    Sig: TAKE 1 TABLET ONCE A DAY  . furosemide (LASIX) 40 MG tablet [Pharmacy Med Name: FUROSEMIDE 40 MG TABLET RAN] 90 tablet 3    Sig: TAKE 1 TABLET BY MOUTH ONCE DAILY  . carvedilol (COREG) 25 MG tablet [Pharmacy Med Name: CARVEDILOL 25MG  (TEVA)] 120 tablet 6    Sig: TAKE 2 TABLETS BY MOUTH TWICE A DAY

## 2012-08-07 ENCOUNTER — Other Ambulatory Visit: Payer: Self-pay | Admitting: Physician Assistant

## 2013-01-31 ENCOUNTER — Other Ambulatory Visit: Payer: Self-pay | Admitting: Cardiology

## 2013-02-03 ENCOUNTER — Other Ambulatory Visit: Payer: Self-pay | Admitting: Cardiology

## 2013-03-02 ENCOUNTER — Emergency Department (INDEPENDENT_AMBULATORY_CARE_PROVIDER_SITE_OTHER)
Admission: EM | Admit: 2013-03-02 | Discharge: 2013-03-02 | Disposition: A | Discharge status: Home / Self Care | Payer: Self-pay | Source: Home / Self Care | Attending: Family Medicine | Admitting: Family Medicine

## 2013-03-02 ENCOUNTER — Encounter (HOSPITAL_COMMUNITY): Payer: Self-pay | Admitting: *Deleted

## 2013-03-02 DIAGNOSIS — M109 Gout, unspecified: Secondary | ICD-10-CM

## 2013-03-02 MED ORDER — COLCHICINE 0.6 MG PO TABS: 0.6000 mg | ORAL_TABLET | Freq: Two times a day (BID) | ORAL | Status: DC

## 2013-03-02 MED ORDER — INDOMETHACIN 50 MG PO CAPS: 50.0000 mg | ORAL_CAPSULE | Freq: Two times a day (BID) | ORAL | Status: DC

## 2013-03-02 NOTE — ED Notes (Signed)
pT  STATES  HE  HAS  HISTORY  OF  GOUT  HE  REPORTS      PAIN L  FOOT  FROM ANKLE  TO  TOE  AT  TIMES  FOR  SEV  WEEKS  -  HE  STATES  HE  HAS  BEEN USING OTC  PILL  FOR  STYMPTOMS  WITHOUT RELEIF

## 2013-03-02 NOTE — ED Provider Notes (Signed)
History     CSN: [id number]  Arrival date & time 03/02/13  1725   First MD Initiated Contact with Patient 03/02/13 1736      Chief Complaint  Patient presents with  . Foot Pain    (Consider location/radiation/quality/duration/timing/severity/associated sxs/prior treatment) Patient is a 58 y.o. male presenting with lower extremity pain. The history is provided by the patient.  Foot Pain This is a recurrent problem. The current episode started more than 1 week ago (h/o gout, sx similar currently.). The problem occurs constantly. The problem has been gradually worsening. The symptoms are aggravated by walking.    Past Medical History  Diagnosis Date  . Essential hypertension, benign   . CAD (coronary artery disease)     NONOBSTRUCTIVE  a. cath 8/10: prox to mid LAD 20%  . NICM (nonischemic cardiomyopathy)     a. echo 8/10: EF20-25%, mod LAE, severe asymmetric septal hypertrophy (? nonobs. HCM); grade 1 diast dyfxn.  EF 55% echo June 6  . Chronic systolic heart failure   . CKD (chronic kidney disease)     baseline creatinine approx 1.5  . Hypertrophic nonobstructive cardiomyopathy     septal hypertrophy without LVOT  . Gout     Past Surgical History  Procedure Laterality Date  . Knee arthroscopy      right    Family History  Problem Relation Age of Onset  . Kidney disease Brother   . Kidney disease Father   . Diabetes Mother   . Heart attack Mother     History  Substance Use Topics  . Smoking status: Never Smoker   . Smokeless tobacco: Not on file  . Alcohol Use: No      Review of Systems  Constitutional: Negative.   Musculoskeletal: Positive for joint swelling and gait problem.  Skin: Negative.     Allergies  Review of patient's allergies indicates no known allergies.  Home Medications   Current Outpatient Rx  Name  Route  Sig  Dispense  Refill  . amLODipine (NORVASC) 10 MG tablet      TAKE 1 TABLET ONCE A DAY   90 tablet   3   . aspirin 81 MG  tablet   Oral   Take 81 mg by mouth daily.           . B Complex Vitamins (VITAMIN B COMPLEX PO)   Oral   Take 1 tablet by mouth daily.           Marland Kitchen BIOTIN PO   Oral   Take 1 tablet by mouth daily.           . carvedilol (COREG) 25 MG tablet      TAKE 2 TABLETS BY MOUTH TWICE A DAY   120 tablet   2   . Cholecalciferol (VITAMIN D-3 PO)   Oral   Take 1 tablet by mouth daily.         Marland Kitchen CINNAMON PO   Oral   Take 1 tablet by mouth daily.           . colchicine 0.6 MG tablet   Oral   Take 1 tablet (0.6 mg total) by mouth 2 (two) times daily. For gout   20 tablet   1   . Cyanocobalamin (VITAMIN B-12 PO)   Oral   Take 1 tablet by mouth daily.           . enalapril (VASOTEC) 20 MG tablet      TAKE 1  TABLET BY MOUTH TWICE DAILY   60 tablet   2   . furosemide (LASIX) 40 MG tablet      TAKE 1 TABLET BY MOUTH ONCE DAILY   90 tablet   3   . Garlic TABS   Oral   Take 1 tablet by mouth daily.           Marland Kitchen ibuprofen (ADVIL,MOTRIN) 200 MG tablet   Oral   Take 800 mg by mouth 3 (three) times daily as needed. For pain         . indomethacin (INDOCIN) 50 MG capsule   Oral   Take 1 capsule (50 mg total) by mouth 2 (two) times daily with a meal.   20 capsule   1   . MAGNESIUM OXIDE PO   Oral   Take 1 tablet by mouth daily.           Marland Kitchen menthol-thymol (ABSORBINE JR) LIQD   Topical   Apply 1 application topically daily as needed. For toe pain         . Oxycodone-Acetaminophen (PERCOCET PO)   Oral   Take 1 tablet by mouth every 4 (four) hours as needed. For pain         . POTASSIUM GLUCONATE PO   Oral   Take 1 tablet by mouth daily.           . Probiotic Product (PROBIOTIC FORMULA PO)   Oral   Take 1-3 tablets by mouth daily.         Marland Kitchen EXPIRED: zolpidem (AMBIEN) 10 MG tablet   Oral   Take 1 tablet (10 mg total) by mouth at bedtime as needed for sleep.   30 tablet   0     BP 135/92  Pulse 72  Temp(Src) 98.6 F (37 C) (Oral)   Resp 16  SpO2 99%  Physical Exam  Nursing note and vitals reviewed. Constitutional: He is oriented to person, place, and time. He appears well-developed and well-nourished.  Musculoskeletal: He exhibits tenderness.       Feet:  Neurological: He is alert and oriented to person, place, and time.  Skin: Skin is warm and dry.    ED Course  Procedures (including critical care time)  Labs Reviewed - No data to display No results found.   1. Acute gouty arthritis       MDM          Linna Hoff, MD 03/02/13 [id number]

## 2013-05-15 ENCOUNTER — Other Ambulatory Visit: Payer: Self-pay

## 2013-05-15 MED ORDER — FUROSEMIDE 40 MG PO TABS: ORAL_TABLET | ORAL | Status: DC

## 2013-05-15 MED ORDER — CARVEDILOL 25 MG PO TABS: ORAL_TABLET | ORAL | Status: DC

## 2013-05-15 MED ORDER — AMLODIPINE BESYLATE 10 MG PO TABS: ORAL_TABLET | ORAL | Status: DC

## 2013-05-15 MED ORDER — ENALAPRIL MALEATE 20 MG PO TABS: ORAL_TABLET | ORAL | Status: DC

## 2013-05-16 ENCOUNTER — Other Ambulatory Visit: Payer: Self-pay | Admitting: *Deleted

## 2013-05-16 MED ORDER — CARVEDILOL 25 MG PO TABS: ORAL_TABLET | ORAL | Status: DC

## 2013-06-25 ENCOUNTER — Emergency Department (INDEPENDENT_AMBULATORY_CARE_PROVIDER_SITE_OTHER)
Admission: EM | Admit: 2013-06-25 | Discharge: 2013-06-25 | Disposition: A | Discharge status: Home / Self Care | Payer: Self-pay | Source: Home / Self Care | Attending: Family Medicine | Admitting: Family Medicine

## 2013-06-25 ENCOUNTER — Encounter (HOSPITAL_COMMUNITY): Payer: Self-pay | Admitting: Emergency Medicine

## 2013-06-25 DIAGNOSIS — M109 Gout, unspecified: Secondary | ICD-10-CM

## 2013-06-25 LAB — URIC ACID: Uric Acid, Serum: 8.1 mg/dL — ABNORMAL HIGH (ref 4.0–7.8)

## 2013-06-25 MED ORDER — KETOROLAC TROMETHAMINE 60 MG/2ML IM SOLN
INTRAMUSCULAR | Status: AC
  Filled 2013-06-25: qty 2

## 2013-06-25 MED ORDER — INDOMETHACIN 50 MG PO CAPS: 50.0000 mg | ORAL_CAPSULE | Freq: Three times a day (TID) | ORAL | Status: DC

## 2013-06-25 MED ORDER — KETOROLAC TROMETHAMINE 60 MG/2ML IM SOLN
60.0000 mg | Freq: Once | INTRAMUSCULAR | Status: AC
  Administered 2013-06-25: 60 mg via INTRAMUSCULAR

## 2013-06-25 MED ORDER — COLCHICINE 0.6 MG PO TABS: 0.6000 mg | ORAL_TABLET | Freq: Two times a day (BID) | ORAL | Status: DC

## 2013-06-25 NOTE — ED Provider Notes (Signed)
CSN: [id number]     Arrival date & time 06/25/13  1711 History     First MD Initiated Contact with Patient 06/25/13 1810     Chief Complaint  Patient presents with  . Knee Pain  . Foot Pain   (Consider location/radiation/quality/duration/timing/severity/associated sxs/prior Treatment) Patient is a 57 y.o. male presenting with knee pain and lower extremity pain. The history is provided by the patient.  Knee Pain Location:  Knee Time since incident:  4 days Injury: no   Knee location:  R knee Pain details:    Quality:  Sharp   Severity:  Moderate   Onset quality:  Sudden   Progression:  Worsening Chronicity:  New (h/o gout in left toe.) Dislocation: no   Prior injury to area:  No Relieved by:  Nothing Foot Pain    Past Medical History  Diagnosis Date  . Essential hypertension, benign   . CAD (coronary artery disease)     NONOBSTRUCTIVE  a. cath 8/10: prox to mid LAD 20%  . NICM (nonischemic cardiomyopathy)     a. echo 8/10: EF20-25%, mod LAE, severe asymmetric septal hypertrophy (? nonobs. HCM); grade 1 diast dyfxn.  EF 55% echo June 6  . Chronic systolic heart failure   . CKD (chronic kidney disease)     baseline creatinine approx 1.5  . Hypertrophic nonobstructive cardiomyopathy     septal hypertrophy without LVOT  . Gout    Past Surgical History  Procedure Laterality Date  . Knee arthroscopy      right   Family History  Problem Relation Age of Onset  . Kidney disease Brother   . Kidney disease Father   . Diabetes Mother   . Heart attack Mother    History  Substance Use Topics  . Smoking status: Never Smoker   . Smokeless tobacco: Not on file  . Alcohol Use: No    Review of Systems  Constitutional: Negative.   Gastrointestinal: Negative.   Musculoskeletal: Positive for joint swelling and gait problem.  Skin: Negative.     Allergies  Review of patient's allergies indicates no known allergies.  Home Medications   Current Outpatient Rx  Name   Route  Sig  Dispense  Refill  . amLODipine (NORVASC) 10 MG tablet      TAKE 1 TABLET ONCE A DAY   90 tablet   0     .Marland KitchenPatient needs to contact office to schedule  App ...   . aspirin 81 MG tablet   Oral   Take 81 mg by mouth daily.           . B Complex Vitamins (VITAMIN B COMPLEX PO)   Oral   Take 1 tablet by mouth daily.           Marland Kitchen BIOTIN PO   Oral   Take 1 tablet by mouth daily.           . carvedilol (COREG) 25 MG tablet      TAKE 2 TABLETS BY MOUTH TWICE A DAY   120 tablet   2     .Marland KitchenPatient needs to contact office to schedule  App ...   . Cholecalciferol (VITAMIN D-3 PO)   Oral   Take 1 tablet by mouth daily.         Marland Kitchen CINNAMON PO   Oral   Take 1 tablet by mouth daily.           . colchicine 0.6 MG tablet   Oral  Take 1 tablet (0.6 mg total) by mouth 2 (two) times daily. For gout   20 tablet   1   . colchicine 0.6 MG tablet   Oral   Take 1 tablet (0.6 mg total) by mouth 2 (two) times daily.   20 tablet   1   . Cyanocobalamin (VITAMIN B-12 PO)   Oral   Take 1 tablet by mouth daily.           . enalapril (VASOTEC) 20 MG tablet      TAKE 1 TABLET BY MOUTH TWICE DAILY   60 tablet   2     .Marland KitchenPatient needs to contact office to schedule  App ...   . furosemide (LASIX) 40 MG tablet      TAKE 1 TABLET BY MOUTH ONCE DAILY   90 tablet   0     .Marland KitchenPatient needs to contact office to schedule  App ...   . Garlic TABS   Oral   Take 1 tablet by mouth daily.           Marland Kitchen ibuprofen (ADVIL,MOTRIN) 200 MG tablet   Oral   Take 800 mg by mouth 3 (three) times daily as needed. For pain         . indomethacin (INDOCIN) 50 MG capsule   Oral   Take 1 capsule (50 mg total) by mouth 2 (two) times daily with a meal.   20 capsule   1   . indomethacin (INDOCIN) 50 MG capsule   Oral   Take 1 capsule (50 mg total) by mouth 3 (three) times daily with meals.   30 capsule   1   . MAGNESIUM OXIDE PO   Oral   Take 1 tablet by mouth daily.            Marland Kitchen menthol-thymol (ABSORBINE JR) LIQD   Topical   Apply 1 application topically daily as needed. For toe pain         . Oxycodone-Acetaminophen (PERCOCET PO)   Oral   Take 1 tablet by mouth every 4 (four) hours as needed. For pain         . POTASSIUM GLUCONATE PO   Oral   Take 1 tablet by mouth daily.           . Probiotic Product (PROBIOTIC FORMULA PO)   Oral   Take 1-3 tablets by mouth daily.         Marland Kitchen EXPIRED: zolpidem (AMBIEN) 10 MG tablet   Oral   Take 1 tablet (10 mg total) by mouth at bedtime as needed for sleep.   30 tablet   0    BP 161/86  Pulse 70  Temp(Src) 98.5 F (36.9 C) (Oral)  Resp 18  SpO2 97% Physical Exam  Nursing note and vitals reviewed. Constitutional: He is oriented to person, place, and time. He appears well-developed and well-nourished.  Musculoskeletal: He exhibits tenderness.       Right knee: He exhibits decreased range of motion, swelling, effusion and bony tenderness. He exhibits no erythema. No medial joint line, no lateral joint line, no MCL and no LCL tenderness noted.  Neurological: He is alert and oriented to person, place, and time.  Skin: Skin is warm and dry.    ED Course   Procedures (including critical care time)  Labs Reviewed  URIC ACID   No results found. 1. Gout flare     MDM    Linna Hoff, MD 06/25/13 1836

## 2013-06-25 NOTE — ED Notes (Signed)
Complaining of right knee and foot pain which started last week.  Denies injury.

## 2013-06-26 ENCOUNTER — Telehealth (HOSPITAL_COMMUNITY): Payer: Self-pay | Admitting: *Deleted

## 2013-06-26 NOTE — ED Notes (Signed)
Uric acid 8.1 H. Pt. treated with Colchicine.  I called and left a message to call. Vassie Moselle 06/26/2013

## 2013-06-27 ENCOUNTER — Telehealth (HOSPITAL_COMMUNITY): Payer: Self-pay | Admitting: *Deleted

## 2013-06-28 ENCOUNTER — Telehealth (HOSPITAL_COMMUNITY): Payer: Self-pay | Admitting: *Deleted

## 2013-07-01 ENCOUNTER — Telehealth (HOSPITAL_COMMUNITY): Payer: Self-pay | Admitting: *Deleted

## 2013-07-01 NOTE — ED Notes (Signed)
Unable to reach pt. By phone x 3.  Letter sent with lab results and instructions to obtain a PCP for long term management of uric acid. Pt. was referred to Dr. Ophelia Charter. Vassie Moselle 07/01/2013

## 2013-07-31 ENCOUNTER — Ambulatory Visit: Payer: Self-pay | Admitting: Cardiology

## 2013-08-02 ENCOUNTER — Other Ambulatory Visit: Payer: Self-pay | Admitting: *Deleted

## 2013-08-02 MED ORDER — AMLODIPINE BESYLATE 10 MG PO TABS: ORAL_TABLET | ORAL | Status: DC

## 2013-08-02 MED ORDER — FUROSEMIDE 40 MG PO TABS: ORAL_TABLET | ORAL | Status: DC

## 2013-08-02 MED ORDER — ENALAPRIL MALEATE 20 MG PO TABS: ORAL_TABLET | ORAL | Status: DC

## 2013-08-02 MED ORDER — CARVEDILOL 25 MG PO TABS: ORAL_TABLET | ORAL | Status: DC

## 2013-09-11 ENCOUNTER — Ambulatory Visit: Payer: Self-pay | Admitting: Cardiology

## 2013-10-01 ENCOUNTER — Ambulatory Visit: Payer: Self-pay | Admitting: Cardiology

## 2013-10-01 ENCOUNTER — Ambulatory Visit (INDEPENDENT_AMBULATORY_CARE_PROVIDER_SITE_OTHER): Payer: Self-pay | Admitting: Cardiology

## 2013-10-01 ENCOUNTER — Encounter: Payer: Self-pay | Admitting: Cardiology

## 2013-10-01 VITALS — BP 146/90 | HR 62 | Ht 72.0 in | Wt 235.0 lb

## 2013-10-01 DIAGNOSIS — I428 Other cardiomyopathies: Secondary | ICD-10-CM

## 2013-10-01 DIAGNOSIS — I2510 Atherosclerotic heart disease of native coronary artery without angina pectoris: Secondary | ICD-10-CM

## 2013-10-01 DIAGNOSIS — I5022 Chronic systolic (congestive) heart failure: Secondary | ICD-10-CM

## 2013-10-01 DIAGNOSIS — I10 Essential (primary) hypertension: Secondary | ICD-10-CM

## 2013-10-01 NOTE — Patient Instructions (Signed)
The current medical regimen is effective;  continue present plan and medications.  Please have blood work today (CBC, BMP)  Your physician has requested that you have an echocardiogram. Echocardiography is a painless test that uses sound waves to create images of your heart. It provides your doctor with information about the size and shape of your heart and how well your heart's chambers and valves are working. This procedure takes approximately one hour. There are no restrictions for this procedure.  Please schedule at appointment to have a blood pressure check with a nurse.  Remember to bring your home blood pressure cuff with you so that we can compare the two.  Follow up in 1 year with Dr Antoine Poche.  You will receive a letter in the mail 2 months before you are due.  Please call us when you receive this letter to schedule your follow up appointment.

## 2013-10-01 NOTE — Progress Notes (Signed)
HPI The patient presents for followup of his hypertension and cardiomyopathy. He has a nonischemic cardiomyopathy with an original ejection fraction of 25% to 50% on followup in June of 2012r. Since last being seen he has no acute cardiac complaints. He has lost about 30 pounds. He is exercising routinely. He feels much better. He sleeping better. He's not having chest pressure, neck or arm discomfort. He's not having any new shortness of breath, PND or orthopnea. He denies any palpitations, presyncope or syncope.  No Known Allergies  Current Outpatient Prescriptions  Medication Sig Dispense Refill  . amLODipine (NORVASC) 10 MG tablet TAKE 1 TABLET ONCE A DAY  30 tablet  1  . aspirin 81 MG tablet Take 81 mg by mouth daily.        . B Complex Vitamins (VITAMIN B COMPLEX PO) Take 1 tablet by mouth daily.        Marland Kitchen BIOTIN PO Take 1 tablet by mouth daily.        . carvedilol (COREG) 25 MG tablet TAKE 2 TABLETS BY MOUTH TWICE A DAY  120 tablet  1  . Cholecalciferol (VITAMIN D-3 PO) Take 1 tablet by mouth daily.      Marland Kitchen CINNAMON PO Take 1 tablet by mouth daily.        . colchicine 0.6 MG tablet Take 1 tablet (0.6 mg total) by mouth 2 (two) times daily. For gout  20 tablet  1  . Cyanocobalamin (VITAMIN B-12 PO) Take 1 tablet by mouth daily.        . enalapril (VASOTEC) 20 MG tablet TAKE 1 TABLET BY MOUTH TWICE DAILY  60 tablet  1  . furosemide (LASIX) 40 MG tablet TAKE 1 TABLET BY MOUTH ONCE DAILY  30 tablet  1  . Garlic TABS Take 1 tablet by mouth daily.        Marland Kitchen ibuprofen (ADVIL,MOTRIN) 200 MG tablet Take 800 mg by mouth 3 (three) times daily as needed. For pain      . indomethacin (INDOCIN) 50 MG capsule Take 1 capsule (50 mg total) by mouth 2 (two) times daily with a meal.  20 capsule  1  . MAGNESIUM OXIDE PO Take 1 tablet by mouth daily.        Marland Kitchen menthol-thymol (ABSORBINE JR) LIQD Apply 1 application topically daily as needed. For toe pain      . POTASSIUM GLUCONATE PO Take 1 tablet by mouth  daily.        . Probiotic Product (PROBIOTIC FORMULA PO) Take 1-3 tablets by mouth daily.      Marland Kitchen zolpidem (AMBIEN) 10 MG tablet Take 1 tablet (10 mg total) by mouth at bedtime as needed for sleep.  30 tablet  0   No current facility-administered medications for this visit.    Past Medical History  Diagnosis Date  . Essential hypertension, benign   . CAD (coronary artery disease)     NONOBSTRUCTIVE  a. cath 8/10: prox to mid LAD 20%  . NICM (nonischemic cardiomyopathy)     a. echo 8/10: EF20-25%, mod LAE, severe asymmetric septal hypertrophy (? nonobs. HCM); grade 1 diast dyfxn.  EF 55% echo June 6  . Chronic systolic heart failure   . CKD (chronic kidney disease)     baseline creatinine approx 1.5  . Hypertrophic nonobstructive cardiomyopathy     septal hypertrophy without LVOT  . Gout     Past Surgical History  Procedure Laterality Date  . Knee arthroscopy  right    ROS:  As stated in the HPI and negative for all other systems.  PHYSICAL EXAM BP 146/90  Pulse 62  Ht 6' (1.829 m)  Wt 235 lb (106.595 kg)  BMI 31.86 kg/m2 GENERAL:  Well appearing HEENT:  Pupils equal round and reactive, fundi not visualized, oral mucosa unremarkable NECK:  No jugular venous distention, waveform within normal limits, carotid upstroke brisk and symmetric, no bruits, no thyromegaly LYMPHATICS:  No cervical, inguinal adenopathy LUNGS:  Clear to auscultation bilaterally BACK:  No CVA tenderness CHEST:  Unremarkable HEART:  PMI not displaced or sustained,S1 and S2 within normal limits, no S3, no S4, no clicks, no rubs, no murmurs ABD:  Flat, positive bowel sounds normal in frequency in pitch, no bruits, no rebound, no guarding, no midline pulsatile mass, no hepatomegaly, no splenomegaly EXT:  2 plus pulses throughout, no edema, no cyanosis no clubbing SKIN:  No rashes no nodules NEURO:  Cranial nerves II through XII grossly intact, motor grossly intact throughout PSYCH:  Cognitively  intact, oriented to person place and time  EKG:  Sinus rhythm, rate 62, leftward axis, anterior lateral T-wave inversions unchanged from previous. 10/01/2013  ASSESSMENT AND PLAN  NICM:  I will check an echocardiogram as it has been a couple of years since his severe septal hypertrophy was imaged.  OBESITY:  I am very proud of his weight loss. No change in therapy is indicated. He will continue with his exercise program.  INSOMNIA:  This is improved as his overall condition has improved.  HTN:  His blood pressure is at the upper limits of acceptable. He has been instructed to get a blood pressure cuff. He will keep a blood pressure diary.  I will check a BMET.

## 2013-10-02 LAB — CBC
HCT: 40.2 % (ref 39.0–52.0)
Hemoglobin: 12.9 g/dL — ABNORMAL LOW (ref 13.0–17.0)
MCHC: 32.1 g/dL (ref 30.0–36.0)
MCV: 68.9 fl — ABNORMAL LOW (ref 78.0–100.0)
Platelets: 189 10*3/uL (ref 150.0–400.0)
RBC: 5.83 Mil/uL — ABNORMAL HIGH (ref 4.22–5.81)
RDW: 19.8 % — ABNORMAL HIGH (ref 11.5–14.6)
WBC: 8.1 10*3/uL (ref 4.5–10.5)

## 2013-10-02 LAB — BASIC METABOLIC PANEL
BUN: 24 mg/dL — ABNORMAL HIGH (ref 6–23)
CO2: 30 mEq/L (ref 19–32)
Calcium: 9.5 mg/dL (ref 8.4–10.5)
Chloride: 101 mEq/L (ref 96–112)
Creatinine, Ser: 1.2 mg/dL (ref 0.4–1.5)
GFR: 64.78 mL/min (ref >60.00)
Glucose, Bld: 83 mg/dL (ref 70–99)
Potassium: 3.9 mEq/L (ref 3.5–5.1)
Sodium: 137 mEq/L (ref 135–145)

## 2013-10-23 ENCOUNTER — Ambulatory Visit (HOSPITAL_COMMUNITY): Payer: Self-pay | Attending: Cardiology | Admitting: Cardiology

## 2013-10-23 ENCOUNTER — Encounter: Payer: Self-pay | Admitting: Cardiology

## 2013-10-23 ENCOUNTER — Ambulatory Visit (INDEPENDENT_AMBULATORY_CARE_PROVIDER_SITE_OTHER): Payer: Self-pay | Admitting: Nurse Practitioner

## 2013-10-23 VITALS — BP 130/84 | HR 65 | Resp 16

## 2013-10-23 DIAGNOSIS — Z136 Encounter for screening for cardiovascular disorders: Secondary | ICD-10-CM

## 2013-10-23 DIAGNOSIS — I2510 Atherosclerotic heart disease of native coronary artery without angina pectoris: Secondary | ICD-10-CM | POA: Insufficient documentation

## 2013-10-23 DIAGNOSIS — Z0130 Encounter for examination of blood pressure without abnormal findings: Secondary | ICD-10-CM

## 2013-10-23 DIAGNOSIS — I5022 Chronic systolic (congestive) heart failure: Secondary | ICD-10-CM

## 2013-10-23 DIAGNOSIS — I509 Heart failure, unspecified: Secondary | ICD-10-CM | POA: Insufficient documentation

## 2013-10-23 DIAGNOSIS — I428 Other cardiomyopathies: Secondary | ICD-10-CM | POA: Insufficient documentation

## 2013-10-23 DIAGNOSIS — I77819 Aortic ectasia, unspecified site: Secondary | ICD-10-CM | POA: Insufficient documentation

## 2013-10-23 NOTE — Progress Notes (Signed)
Echo performed. 

## 2013-10-23 NOTE — Progress Notes (Signed)
Patient presents ambulatory from lobby here for nurse visit BP check.  Patient is in no acute distress, skin warm, dry and acyanotic, patient alert & oriented to person, place, time.  Patient brought his automatic BP cuff to compare with our manual cuff per Dr. Jenene Slicker instructions.  BP checked bilateral arms with both a manual cuff and with patient's auto cuff and documented under extended vital signs.  Patient denies complaints and verified that he is taking medications as directed.  I advised patient that I would send message to Dr. Antoine Poche and that we would call him if there are any changes to medical therapy.  Patient verbalized agreement and understanding and was discharged ambulatory in no acute distress.

## 2013-11-03 NOTE — Progress Notes (Signed)
BP OK.  Continue current therapy.   

## 2013-11-06 ENCOUNTER — Other Ambulatory Visit: Payer: Self-pay | Admitting: Cardiology

## 2013-11-07 ENCOUNTER — Other Ambulatory Visit: Payer: Self-pay | Admitting: Cardiology

## 2013-11-08 ENCOUNTER — Other Ambulatory Visit: Payer: Self-pay

## 2013-11-08 MED ORDER — CARVEDILOL 25 MG PO TABS: ORAL_TABLET | ORAL | Status: DC

## 2013-11-09 ENCOUNTER — Other Ambulatory Visit: Payer: Self-pay

## 2013-11-09 MED ORDER — ENALAPRIL MALEATE 20 MG PO TABS: ORAL_TABLET | ORAL | Status: DC

## 2014-01-08 ENCOUNTER — Encounter (INDEPENDENT_AMBULATORY_CARE_PROVIDER_SITE_OTHER): Payer: Self-pay

## 2014-01-08 ENCOUNTER — Ambulatory Visit (INDEPENDENT_AMBULATORY_CARE_PROVIDER_SITE_OTHER): Payer: 59 | Admitting: General Surgery

## 2014-01-08 ENCOUNTER — Encounter (INDEPENDENT_AMBULATORY_CARE_PROVIDER_SITE_OTHER): Payer: Self-pay | Admitting: General Surgery

## 2014-01-08 VITALS — BP 120/90 | HR 68 | Temp 98.6°F | Resp 14 | Ht 71.0 in | Wt 231.6 lb

## 2014-01-08 DIAGNOSIS — K4090 Unilateral inguinal hernia, without obstruction or gangrene, not specified as recurrent: Secondary | ICD-10-CM | POA: Insufficient documentation

## 2014-01-08 NOTE — Patient Instructions (Signed)
Will get cardiac clearance

## 2014-01-08 NOTE — Progress Notes (Signed)
Patient ID: Craig Prince, male   DOB: 01/11/1956, 58 y.o.   MRN: [id number]  Chief Complaint  Patient presents with  . New Evaluation    eval [facility]    HPI Craig Prince is a 58 y.o. male.  We're asked to see the patient in consultation by Dr. Nicholos Johns to evaluate him for a left inguinal hernia. The patient is a 58 year old black male who first noticed a bulge in his left groin about 3 months ago. He is able to push it back in. He denies any pain associated with it. He denies any nausea or vomiting. His appetite is good and his bowels are working normally.  HPI  Past Medical History  Diagnosis Date  . Essential hypertension, benign   . CAD (coronary artery disease)     NONOBSTRUCTIVE  a. cath 8/10: prox to mid LAD 20%  . NICM (nonischemic cardiomyopathy)     a. echo 8/10: EF20-25%, mod LAE, severe asymmetric septal hypertrophy (? nonobs. HCM); grade 1 diast dyfxn.  EF 55% echo June 6  . Chronic systolic heart failure   . CKD (chronic kidney disease)     baseline creatinine approx 1.5  . Hypertrophic nonobstructive cardiomyopathy     septal hypertrophy without LVOT  . Gout   . Hyperlipidemia     Past Surgical History  Procedure Laterality Date  . Knee arthroscopy      right    Family History  Problem Relation Age of Onset  . Kidney disease Brother   . Cancer Brother     prostate  . Kidney disease Father   . Diabetes Mother   . Heart attack Mother   . Heart disease Mother   . Cancer Sister     breast    Social History History  Substance Use Topics  . Smoking status: Never Smoker   . Smokeless tobacco: Never Used  . Alcohol Use: No    No Known Allergies  Current Outpatient Prescriptions  Medication Sig Dispense Refill  . amLODipine (NORVASC) 10 MG tablet TAKE 1 TABLET BY MOUTH DAILY  30 tablet  10  . aspirin 81 MG tablet Take 81 mg by mouth daily.        . B Complex Vitamins (VITAMIN B COMPLEX PO) Take 1 tablet by mouth daily.        Marland Kitchen BIOTIN PO Take 1  tablet by mouth daily.        . carvedilol (COREG) 25 MG tablet TAKE 2 TABLETS BY MOUTH TWICE A DAY  120 tablet  3  . CINNAMON PO Take 1 tablet by mouth daily.        . colchicine 0.6 MG tablet Take 1 tablet (0.6 mg total) by mouth 2 (two) times daily. For gout  20 tablet  1  . Cyanocobalamin (VITAMIN B-12 PO) Take 1 tablet by mouth daily.        . enalapril (VASOTEC) 20 MG tablet TAKE 1 TABLET BY MOUTH TWICE DAILY  60 tablet  6  . furosemide (LASIX) 40 MG tablet TAKE 1 TABLET BY MOUTH ONCE DAILY  30 tablet  10  . Garlic TABS Take 1 tablet by mouth daily.        Marland Kitchen ibuprofen (ADVIL,MOTRIN) 200 MG tablet Take 800 mg by mouth 3 (three) times daily as needed. For pain      . indomethacin (INDOCIN) 50 MG capsule Take 1 capsule (50 mg total) by mouth 2 (two) times daily with a meal.  20  capsule  1  . MAGNESIUM OXIDE PO Take 1 tablet by mouth daily.        . niacin 100 MG tablet Take 100 mg by mouth at bedtime.      Marland Kitchen. POTASSIUM GLUCONATE PO Take 1 tablet by mouth daily.        Marland Kitchen. zolpidem (AMBIEN) 10 MG tablet Take 1 tablet (10 mg total) by mouth at bedtime as needed for sleep.  30 tablet  0  . Cholecalciferol (VITAMIN D-3 PO) Take 1 tablet by mouth daily.      . Probiotic Product (PROBIOTIC FORMULA PO) Take 1-3 tablets by mouth daily.       No current facility-administered medications for this visit.    Review of Systems Review of Systems  Constitutional: Negative.   HENT: Negative.   Eyes: Negative.   Respiratory: Negative.   Cardiovascular: Negative.   Gastrointestinal: Negative.   Endocrine: Negative.   Genitourinary: Negative.   Musculoskeletal: Negative.   Skin: Negative.   Allergic/Immunologic: Negative.   Neurological: Negative.   Hematological: Negative.   Psychiatric/Behavioral: Negative.     Blood pressure 120/90, pulse 68, temperature 98.6 F (37 C), temperature source Oral, resp. rate 14, height 5\' 11"  (1.803 m), weight 231 lb 9.3 oz (105.044 kg).  Physical  Exam Physical Exam  Constitutional: He is oriented to person, place, and time. He appears well-developed and well-nourished.  HENT:  Head: Normocephalic and atraumatic.  Eyes: Conjunctivae and EOM are normal. Pupils are equal, round, and reactive to light.  Neck: Normal range of motion. Neck supple.  Cardiovascular: Normal rate, regular rhythm and normal heart sounds.   Pulmonary/Chest: Effort normal and breath sounds normal.  Abdominal: Soft. Bowel sounds are normal.  Genitourinary:  There is a definite bulge in the left groin that reduces easily. There is no bulge or impulse with straining in the right groin.  Musculoskeletal: Normal range of motion.  Neurological: He is alert and oriented to person, place, and time.  Skin: Skin is warm and dry.  Psychiatric: He has a normal mood and affect. His behavior is normal.    Data Reviewed As above  Assessment    The patient appears to have a moderate size left inguinal hernia. Because of the risk of incarceration and strangulation I think he would benefit from having this fixed. I've discussed with him in detail the risks and benefits of the operation to fix the hernia as well as some of the technical aspects including the use of mesh and he understands and wishes to proceed. Given his cardiac history we will obtain cardiac clearance prior to scheduling surgery     Plan    Plan for cardiac clearance and then left inguinal hernia repair with mesh        TOTH III,Angeleen Horney S 01/08/2014, 12:05 PM

## 2014-01-14 ENCOUNTER — Telehealth: Payer: Self-pay | Admitting: Cardiology

## 2014-01-14 NOTE — Telephone Encounter (Signed)
Received request from Nurse fax box, documents faxed for surgical clearance. To: [facility] Fax number: [phone] Attention: 2.16.15/km

## 2014-02-15 ENCOUNTER — Other Ambulatory Visit: Payer: Self-pay | Admitting: Gastroenterology

## 2014-02-21 ENCOUNTER — Emergency Department (HOSPITAL_COMMUNITY)
Admission: EM | Admit: 2014-02-21 | Discharge: 2014-02-21 | Disposition: A | Discharge status: Home / Self Care | Payer: 59 | Source: Home / Self Care | Attending: Family Medicine | Admitting: Family Medicine

## 2014-02-21 ENCOUNTER — Encounter (HOSPITAL_COMMUNITY): Payer: Self-pay | Admitting: Emergency Medicine

## 2014-02-21 DIAGNOSIS — M109 Gout, unspecified: Secondary | ICD-10-CM

## 2014-02-21 MED ORDER — NAPROXEN 500 MG PO TABS: 500.0000 mg | ORAL_TABLET | Freq: Two times a day (BID) | ORAL | Status: DC

## 2014-02-21 MED ORDER — COLCHICINE 0.6 MG PO TABS: ORAL_TABLET | ORAL | Status: DC

## 2014-02-21 NOTE — ED Notes (Signed)
Left wrist pain, swollen, redness, no known injury.  Onset of this episode 4 days ago.  Patient reports previous episodes, tendonitis vs gout.

## 2014-02-21 NOTE — Discharge Instructions (Signed)
Sign up for the colcrys (colchicine) savings program.   If your gout flares again, talk with your primary care doctor about preventive measures.    Gout Gout is an inflammatory arthritis caused by a buildup of uric acid crystals in the joints. Uric acid is a chemical that is normally present in the blood. When the level of uric acid in the blood is too high it can form crystals that deposit in your joints and tissues. This causes joint redness, soreness, and swelling (inflammation). Repeat attacks are common. Over time, uric acid crystals can form into masses (tophi) near a joint, destroying bone and causing disfigurement. Gout is treatable and often preventable. CAUSES  The disease begins with elevated levels of uric acid in the blood. Uric acid is produced by your body when it breaks down a naturally found substance called purines. Certain foods you eat, such as meats and fish, contain high amounts of purines. Causes of an elevated uric acid level include:  Being passed down from parent to child (heredity).  Diseases that cause increased uric acid production (such as obesity, psoriasis, and certain cancers).  Excessive alcohol use.  Diet, especially diets rich in meat and seafood.  Medicines, including certain cancer-fighting medicines (chemotherapy), water pills (diuretics), and aspirin.  Chronic kidney disease. The kidneys are no longer able to remove uric acid well.  Problems with metabolism. Conditions strongly associated with gout include:  Obesity.  High blood pressure.  High cholesterol.  Diabetes. Not everyone with elevated uric acid levels gets gout. It is not understood why some people get gout and others do not. Surgery, joint injury, and eating too much of certain foods are some of the factors that can lead to gout attacks. SYMPTOMS   An attack of gout comes on quickly. It causes intense pain with redness, swelling, and warmth in a joint.  Fever can occur.  Often,  only one joint is involved. Certain joints are more commonly involved:  Base of the big toe.  Knee.  Ankle.  Wrist.  Finger. Without treatment, an attack usually goes away in a few days to weeks. Between attacks, you usually will not have symptoms, which is different from many other forms of arthritis. DIAGNOSIS  Your caregiver will suspect gout based on your symptoms and exam. In some cases, tests may be recommended. The tests may include:  Blood tests.  Urine tests.  X-rays.  Joint fluid exam. This exam requires a needle to remove fluid from the joint (arthrocentesis). Using a microscope, gout is confirmed when uric acid crystals are seen in the joint fluid. TREATMENT  There are two phases to gout treatment: treating the sudden onset (acute) attack and preventing attacks (prophylaxis).  Treatment of an Acute Attack.  Medicines are used. These include anti-inflammatory medicines or steroid medicines.  An injection of steroid medicine into the affected joint is sometimes necessary.  The painful joint is rested. Movement can worsen the arthritis.  You may use warm or cold treatments on painful joints, depending which works best for you.  Treatment to Prevent Attacks.  If you suffer from frequent gout attacks, your caregiver may advise preventive medicine. These medicines are started after the acute attack subsides. These medicines either help your kidneys eliminate uric acid from your body or decrease your uric acid production. You may need to stay on these medicines for a very long time.  The early phase of treatment with preventive medicine can be associated with an increase in acute gout attacks. For  this reason, during the first few months of treatment, your caregiver may also advise you to take medicines usually used for acute gout treatment. Be sure you understand your caregiver's directions. Your caregiver may make several adjustments to your medicine dose before these  medicines are effective.  Discuss dietary treatment with your caregiver or dietitian. Alcohol and drinks high in sugar and fructose and foods such as meat, poultry, and seafood can increase uric acid levels. Your caregiver or dietician can advise you on drinks and foods that should be limited. HOME CARE INSTRUCTIONS   Do not take aspirin to relieve pain. This raises uric acid levels.  Only take over-the-counter or prescription medicines for pain, discomfort, or fever as directed by your caregiver.  Rest the joint as much as possible. When in bed, keep sheets and blankets off painful areas.  Keep the affected joint raised (elevated).  Apply warm or cold treatments to painful joints. Use of warm or cold treatments depends on which works best for you.  Use crutches if the painful joint is in your leg.  Drink enough fluids to keep your urine clear or pale yellow. This helps your body get rid of uric acid. Limit alcohol, sugary drinks, and fructose drinks.  Follow your dietary instructions. Pay careful attention to the amount of protein you eat. Your daily diet should emphasize fruits, vegetables, whole grains, and fat-free or low-fat milk products. Discuss the use of coffee, vitamin C, and cherries with your caregiver or dietician. These may be helpful in lowering uric acid levels.  Maintain a healthy body weight. SEEK MEDICAL CARE IF:   You develop diarrhea, vomiting, or any side effects from medicines.  You do not feel better in 24 hours, or you are getting worse. SEEK IMMEDIATE MEDICAL CARE IF:   Your joint becomes suddenly more tender, and you have chills or a fever. MAKE SURE YOU:   Understand these instructions.  Will watch your condition.  Will get help right away if you are not doing well or get worse. Document Released: 11/12/2000 Document Revised: 03/12/2013 Document Reviewed: 06/28/2012 [facility] Patient Information 2014 [place], [place].

## 2014-02-21 NOTE — ED Provider Notes (Signed)
Medical screening examination/treatment/procedure(s) were performed by a resident physician or non-physician practitioner and as the supervising physician I was immediately available for consultation/collaboration.  Farryn Linares, MD    Mashelle Busick S Baltasar Twilley, MD 02/21/14 1818 

## 2014-02-21 NOTE — ED Provider Notes (Signed)
CSN: [id number]     Arrival date & time 02/21/14  0809 History   First MD Initiated Contact with Patient 02/21/14 [phone]     Chief Complaint  Patient presents with  . Wrist Pain   (Consider location/radiation/quality/duration/timing/severity/associated sxs/prior Treatment) HPI Comments: Denies injury. Hx gout but no gout flares in years  Patient is a 58 y.o. male presenting with wrist pain. The history is provided by the patient.  Wrist Pain This is a new problem. Episode onset: 4 days ago. The problem occurs constantly. The problem has been gradually worsening. Exacerbated by: movement. Nothing relieves the symptoms. Treatments tried: ibuprofen, naproxen. The treatment provided no relief.    Past Medical History  Diagnosis Date  . Essential hypertension, benign   . CAD (coronary artery disease)     NONOBSTRUCTIVE  a. cath 8/10: prox to mid LAD 20%  . NICM (nonischemic cardiomyopathy)     a. echo 8/10: EF20-25%, mod LAE, severe asymmetric septal hypertrophy (? nonobs. HCM); grade 1 diast dyfxn.  EF 55% echo June 6  . Chronic systolic heart failure   . CKD (chronic kidney disease)     baseline creatinine approx 1.5  . Hypertrophic nonobstructive cardiomyopathy     septal hypertrophy without LVOT  . Gout   . Hyperlipidemia    Past Surgical History  Procedure Laterality Date  . Knee arthroscopy      right   Family History  Problem Relation Age of Onset  . Kidney disease Brother   . Cancer Brother     prostate  . Kidney disease Father   . Diabetes Mother   . Heart attack Mother   . Heart disease Mother   . Cancer Sister     breast   History  Substance Use Topics  . Smoking status: Never Smoker   . Smokeless tobacco: Never Used  . Alcohol Use: No    Review of Systems  Constitutional: Negative for fever and chills.  Musculoskeletal:       Wrist pain and swelling  Skin: Negative for color change, rash and wound.  Neurological: Negative for numbness.    Allergies   Review of patient's allergies indicates no known allergies.  Home Medications   Current Outpatient Rx  Name  Route  Sig  Dispense  Refill  . amLODipine (NORVASC) 10 MG tablet      TAKE 1 TABLET BY MOUTH DAILY   30 tablet   10   . aspirin 81 MG tablet   Oral   Take 81 mg by mouth daily.           . B Complex Vitamins (VITAMIN B COMPLEX PO)   Oral   Take 1 tablet by mouth daily.           Marland Kitchen. BIOTIN PO   Oral   Take 1 tablet by mouth daily.           . carvedilol (COREG) 25 MG tablet      TAKE 2 TABLETS BY MOUTH TWICE A DAY   120 tablet   3   . Cholecalciferol (VITAMIN D-3 PO)   Oral   Take 1 tablet by mouth daily.         Marland Kitchen. CINNAMON PO   Oral   Take 1 tablet by mouth daily.           . colchicine (COLCRYS) 0.6 MG tablet      1.2mg  po, then 0.6mg  po 1 hour later   3 tablet  0   . colchicine 0.6 MG tablet   Oral   Take 1 tablet (0.6 mg total) by mouth 2 (two) times daily. For gout   20 tablet   1   . Cyanocobalamin (VITAMIN B-12 PO)   Oral   Take 1 tablet by mouth daily.           . enalapril (VASOTEC) 20 MG tablet      TAKE 1 TABLET BY MOUTH TWICE DAILY   60 tablet   6   . furosemide (LASIX) 40 MG tablet      TAKE 1 TABLET BY MOUTH ONCE DAILY   30 tablet   10   . Garlic TABS   Oral   Take 1 tablet by mouth daily.           Marland Kitchen ibuprofen (ADVIL,MOTRIN) 200 MG tablet   Oral   Take 800 mg by mouth 3 (three) times daily as needed. For pain         . indomethacin (INDOCIN) 50 MG capsule   Oral   Take 1 capsule (50 mg total) by mouth 2 (two) times daily with a meal.   20 capsule   1   . MAGNESIUM OXIDE PO   Oral   Take 1 tablet by mouth daily.           . naproxen (NAPROSYN) 500 MG tablet   Oral   Take 1 tablet (500 mg total) by mouth 2 (two) times daily.   20 tablet   0   . niacin 100 MG tablet   Oral   Take 100 mg by mouth at bedtime.         Marland Kitchen POTASSIUM GLUCONATE PO   Oral   Take 1 tablet by mouth daily.            . Probiotic Product (PROBIOTIC FORMULA PO)   Oral   Take 1-3 tablets by mouth daily.         Marland Kitchen EXPIRED: zolpidem (AMBIEN) 10 MG tablet   Oral   Take 1 tablet (10 mg total) by mouth at bedtime as needed for sleep.   30 tablet   0    BP 138/83  Pulse 72  Temp(Src) 98.3 F (36.8 C) (Oral)  Resp 18  SpO2 97% Physical Exam  Constitutional: He appears well-developed and well-nourished. No distress.  Cardiovascular:  Pulses:      Radial pulses are 2+ on the right side, and 2+ on the left side.  Musculoskeletal:       Right wrist: Normal.       Left wrist: He exhibits decreased range of motion, tenderness and swelling. He exhibits no bony tenderness and no deformity.       Right hand: Normal.       Left hand: He exhibits decreased range of motion and tenderness. He exhibits no bony tenderness and no deformity. Normal sensation noted.  Pt refuses ROM hand/fingers/wrist due to pain.   Neurological: No sensory deficit.  Skin: Skin is warm and dry. No erythema.    ED Course  Procedures (including critical care time) Labs Review Labs Reviewed - No data to display Imaging Review No results found.   MDM   1. Gout   rx colchicine 1.2mg x1, then 0.6mg  one hour later. rx naproxen 500mg  BID #20. Pt to f/u with pcp if further gout flares.      Cathlyn Parsons, NP 02/21/14 1610  Cathlyn Parsons, NP 02/21/14 [phone]

## 2014-02-22 ENCOUNTER — Ambulatory Visit (HOSPITAL_COMMUNITY): Admission: RE | Admit: 2014-02-22 | Payer: 59 | Source: Ambulatory Visit | Admitting: Gastroenterology

## 2014-02-22 ENCOUNTER — Encounter (HOSPITAL_COMMUNITY): Admission: RE | Payer: Self-pay | Source: Ambulatory Visit

## 2014-02-22 SURGERY — COLONOSCOPY
Anesthesia: Moderate Sedation

## 2014-05-06 ENCOUNTER — Encounter (INDEPENDENT_AMBULATORY_CARE_PROVIDER_SITE_OTHER): Payer: Self-pay

## 2014-05-29 ENCOUNTER — Other Ambulatory Visit (INDEPENDENT_AMBULATORY_CARE_PROVIDER_SITE_OTHER): Payer: Self-pay

## 2014-05-29 DIAGNOSIS — K4090 Unilateral inguinal hernia, without obstruction or gangrene, not specified as recurrent: Secondary | ICD-10-CM

## 2014-05-29 MED ORDER — OXYCODONE-ACETAMINOPHEN 5-325 MG PO TABS: 1.0000 | ORAL_TABLET | ORAL | Status: DC | PRN

## 2014-05-30 ENCOUNTER — Other Ambulatory Visit: Payer: Self-pay | Admitting: *Deleted

## 2014-05-30 MED ORDER — ENALAPRIL MALEATE 20 MG PO TABS: ORAL_TABLET | ORAL | Status: DC

## 2014-05-30 MED ORDER — CARVEDILOL 25 MG PO TABS: ORAL_TABLET | ORAL | Status: DC

## 2014-08-12 ENCOUNTER — Other Ambulatory Visit: Payer: Self-pay

## 2014-08-12 MED ORDER — ENALAPRIL MALEATE 20 MG PO TABS: ORAL_TABLET | ORAL | Status: DC

## 2014-08-12 MED ORDER — CARVEDILOL 25 MG PO TABS: ORAL_TABLET | ORAL | Status: DC

## 2014-08-12 MED ORDER — FUROSEMIDE 40 MG PO TABS: ORAL_TABLET | ORAL | Status: DC

## 2014-08-12 MED ORDER — AMLODIPINE BESYLATE 10 MG PO TABS: ORAL_TABLET | ORAL | Status: DC

## 2014-10-02 ENCOUNTER — Encounter: Payer: Self-pay | Admitting: Cardiology

## 2014-10-02 ENCOUNTER — Ambulatory Visit (INDEPENDENT_AMBULATORY_CARE_PROVIDER_SITE_OTHER): Payer: BC Managed Care – PPO | Admitting: Cardiology

## 2014-10-02 VITALS — BP 140/90 | HR 65 | Ht 72.0 in | Wt 237.0 lb

## 2014-10-02 DIAGNOSIS — I5022 Chronic systolic (congestive) heart failure: Secondary | ICD-10-CM

## 2014-10-02 DIAGNOSIS — I10 Essential (primary) hypertension: Secondary | ICD-10-CM

## 2014-10-02 MED ORDER — ZOLPIDEM TARTRATE 10 MG PO TABS: 10.0000 mg | ORAL_TABLET | Freq: Every evening | ORAL | Status: DC | PRN

## 2014-10-02 NOTE — Progress Notes (Signed)
HPI The patient presents for followup of his hypertension and cardiomyopathy. He has a nonischemic cardiomyopathy with an original ejection fraction of 25% to 50% on followup in June of 2012. Since last being seen he has no acute cardiac complaints.  He is not exercising as much as he used to. However, with his current level of activity he denies any cardiovascular symptoms. He is not having any chest pressure, neck or arm discomfort. He's not had any palpitations, presyncope or syncope. She has had no weight gain or edema.  No Known Allergies  Current Outpatient Prescriptions  Medication Sig Dispense Refill  . amLODipine (NORVASC) 10 MG tablet TAKE 1 TABLET BY MOUTH DAILY 30 tablet 2  . aspirin 81 MG tablet Take 81 mg by mouth daily.      . B Complex Vitamins (VITAMIN B COMPLEX PO) Take 1 tablet by mouth daily.      Marland Kitchen BIOTIN PO Take 1 tablet by mouth daily.      . carvedilol (COREG) 25 MG tablet TAKE 2 TABLETS BY MOUTH TWICE A DAY 360 tablet 2  . Cholecalciferol (VITAMIN D-3 PO) Take 1 tablet by mouth daily.    Marland Kitchen CINNAMON PO Take 1 tablet by mouth daily.      . colchicine 0.6 MG tablet Take 1 tablet (0.6 mg total) by mouth 2 (two) times daily. For gout 20 tablet 1  . Cyanocobalamin (VITAMIN B-12 PO) Take 1 tablet by mouth daily.      . enalapril (VASOTEC) 20 MG tablet TAKE 1 TABLET BY MOUTH TWICE DAILY 180 tablet 2  . furosemide (LASIX) 40 MG tablet TAKE 1 TABLET BY MOUTH ONCE DAILY 30 tablet 2  . Garlic TABS Take 1 tablet by mouth daily.      Marland Kitchen ibuprofen (ADVIL,MOTRIN) 200 MG tablet Take 800 mg by mouth 3 (three) times daily as needed. For pain    . indomethacin (INDOCIN) 50 MG capsule Take 1 capsule (50 mg total) by mouth 2 (two) times daily with a meal. 20 capsule 1  . MAGNESIUM OXIDE PO Take 1 tablet by mouth daily.      . naproxen (NAPROSYN) 500 MG tablet Take 1 tablet (500 mg total) by mouth 2 (two) times daily. 20 tablet 0  . niacin 100 MG tablet Take 100 mg by mouth at bedtime.      Marland Kitchen oxyCODONE-acetaminophen (ROXICET) 5-325 MG per tablet Take 1-2 tablets by mouth every 4 (four) hours as needed. 50 tablet 0  . POTASSIUM GLUCONATE PO Take 1 tablet by mouth daily.      . Probiotic Product (PROBIOTIC FORMULA PO) Take 1-3 tablets by mouth daily.    Marland Kitchen zolpidem (AMBIEN) 10 MG tablet Take 1 tablet (10 mg total) by mouth at bedtime as needed for sleep. 30 tablet 0   No current facility-administered medications for this visit.    Past Medical History  Diagnosis Date  . Essential hypertension, benign   . CAD (coronary artery disease)     NONOBSTRUCTIVE  a. cath 8/10: prox to mid LAD 20%  . NICM (nonischemic cardiomyopathy)     a. echo 8/10: EF20-25%, mod LAE, severe asymmetric septal hypertrophy (? nonobs. HCM); grade 1 diast dyfxn.  EF 55% echo June 6  . Chronic systolic heart failure   . CKD (chronic kidney disease)     baseline creatinine approx 1.5  . Hypertrophic nonobstructive cardiomyopathy     septal hypertrophy without LVOT  . Gout   . Hyperlipidemia  Past Surgical History  Procedure Laterality Date  . Knee arthroscopy      right    ROS:  As stated in the HPI and negative for all other systems.  PHYSICAL EXAM BP 140/90 mmHg  Pulse 65  Ht 6' (1.829 m)  Wt 237 lb (107.502 kg)  BMI 32.14 kg/m2 GENERAL:  Well appearing HEENT:  Pupils equal round and reactive, fundi not visualized, oral mucosa unremarkable NECK:  No jugular venous distention, waveform within normal limits, carotid upstroke brisk and symmetric, no bruits, no thyromegaly LYMPHATICS:  No cervical, inguinal adenopathy LUNGS:  Clear to auscultation bilaterally BACK:  No CVA tenderness CHEST:  Unremarkable HEART:  PMI not displaced or sustained,S1 and S2 within normal limits, no S3, no S4, no clicks, no rubs, no murmurs ABD:  Flat, positive bowel sounds normal in frequency in pitch, no bruits, no rebound, no guarding, no midline pulsatile mass, no hepatomegaly, no splenomegaly EXT:  2 plus  pulses throughout, no edema, no cyanosis no clubbing  EKG:  Sinus rhythm, rate 65, leftward axis, anterior lateral T-wave inversions unchanged from previous. 10/02/2014  ASSESSMENT AND PLAN  NICM:  He does have some septal hypertrophy. He has mild global hypokinesis. I don't think another echocardiogram is indicated the year but I would like to do a POET (Plain Old Exercise Treadmill).  This will be difficult to interpret with his baseline EKG changes. However I am not particularly concerned about obstructive coronary disease in this patient. He has a low pretest probability for this. I would like to make sure he has a normal blood pressure response and no dysrhythmias suggestive of a problem with his septal hypertrophy.  OBESITY:   He has gained back a few pounds and is not exercising as much and we discussed this.  INSOMNIA:  I have agreed to renew a limited prescription for insomnia.  HTN:  His blood pressure is at the upper limits of acceptable. He says that it is well controlled. We will check this on the treadmill as well.

## 2014-10-02 NOTE — Patient Instructions (Signed)
Schedule Plain Treadmill at [facility] office with a PA   May take Ambien 10 mg at night for sleep    Your physician wants you to follow-up in: 1 year. You will receive a reminder letter in the mail two months in advance. If you don't receive a letter, please call our office to schedule the follow-up appointment.

## 2014-11-06 ENCOUNTER — Encounter: Payer: BC Managed Care – PPO | Admitting: Nurse Practitioner

## 2014-12-18 ENCOUNTER — Ambulatory Visit (INDEPENDENT_AMBULATORY_CARE_PROVIDER_SITE_OTHER): Payer: 59 | Admitting: Nurse Practitioner

## 2014-12-18 ENCOUNTER — Encounter: Payer: Self-pay | Admitting: Nurse Practitioner

## 2014-12-18 ENCOUNTER — Other Ambulatory Visit: Payer: Self-pay

## 2014-12-18 DIAGNOSIS — I5022 Chronic systolic (congestive) heart failure: Secondary | ICD-10-CM

## 2014-12-18 DIAGNOSIS — I10 Essential (primary) hypertension: Secondary | ICD-10-CM

## 2014-12-18 MED ORDER — AMLODIPINE BESYLATE 10 MG PO TABS: ORAL_TABLET | ORAL | Status: DC

## 2014-12-18 NOTE — Progress Notes (Signed)
Craig Prince presented to this office today for a GXT per Dr. Antoine Poche. This appointment had been rescheduled by the patient from the [facility] [place] office to here.  Dr. Jenene Slicker note reviewed.  Resting EKG quite abnormal with ST & T wave changes along with lateral and septal Q's.   Resting tracing was reviewed with Dr. Elease Hashimoto here in the office today (DOD). We both feel that automatically the study is not diagnostic and that even if he does exercise for this study - it will be not be interpretable.  We would feel more comfortable having this study done in Dr. Jenene Slicker presence.  We have rescheduled this study for [facility] on a day that Dr. Antoine Poche is present.   Rosalio Macadamia, RN, ANP-C [facility] [address] [place], [place]  [address] [phone]

## 2014-12-18 NOTE — Progress Notes (Signed)
Exercise Treadmill Test  Pre-Exercise Testing Evaluation Rhythm: normal sinus  Rate: 67 bpm     Test  Exercise Tolerance Test Ordering MD: Angelina Sheriff, MD  Interpreting MD: Norma Fredrickson, NP  Unique Test No: 1  Treadmill:  1  Indication for ETT:   Contraindication to ETT: No   Stress Modality: exercise - treadmill  Cardiac Imaging Performed: non   Protocol: standard Bruce - maximal  Max BP  Max MPHR (bpm):  161 85% MPR (bpm):  137  MPHR obtained (bpm):   % MPHR obtained:    Reached 85% MPHR (min:sec):   Total Exercise Time (min-sec):    Workload in METS:   Borg Scale:   Reason ETT Terminated:      ST Segment Analysis At Rest:  With Exercise:   Other Information Arrhythmia:   Angina during ETT:   Quality of ETT:    ETT Interpretation:    Comments:   Recommendations:

## 2014-12-27 ENCOUNTER — Telehealth (HOSPITAL_COMMUNITY): Payer: Self-pay

## 2014-12-27 NOTE — Telephone Encounter (Signed)
Encounter complete. 

## 2015-01-01 ENCOUNTER — Encounter (HOSPITAL_COMMUNITY): Payer: 59

## 2015-01-22 ENCOUNTER — Telehealth (HOSPITAL_COMMUNITY): Payer: Self-pay

## 2015-01-22 NOTE — Telephone Encounter (Signed)
Encounter complete. 

## 2015-01-23 NOTE — Telephone Encounter (Signed)
Encounter complete,  

## 2015-01-23 NOTE — Telephone Encounter (Signed)
Encounter complete. 

## 2015-01-24 ENCOUNTER — Ambulatory Visit (HOSPITAL_COMMUNITY)
Admission: RE | Admit: 2015-01-24 | Discharge: 2015-01-24 | Disposition: A | Discharge status: Home / Self Care | Payer: 59 | Source: Ambulatory Visit | Attending: Cardiology | Admitting: Cardiology

## 2015-01-24 DIAGNOSIS — I429 Cardiomyopathy, unspecified: Secondary | ICD-10-CM | POA: Diagnosis not present

## 2015-01-24 DIAGNOSIS — I10 Essential (primary) hypertension: Secondary | ICD-10-CM | POA: Diagnosis present

## 2015-01-24 DIAGNOSIS — I5022 Chronic systolic (congestive) heart failure: Secondary | ICD-10-CM | POA: Diagnosis not present

## 2015-01-24 NOTE — Procedures (Signed)
Exercise Treadmill Test    Test  Exercise Tolerance Test Ordering MD: Angelina Sheriff, MD  Interpreting MD:   Unique Test No: 1 Treadmill:  1  Indication for ETT: Non-Ischemic Cardiomyopathy, Follow-Up CHF  Contraindication to ETT: No   Stress Modality: exercise - treadmill  Cardiac Imaging Performed: non   Protocol: standard Bruce - maximal  Max BP:  170/110  Max MPHR (bpm): 161 85% MPR (bpm): 136  MPHR obtained (bpm):  129 % MPHR obtained:  80  Reached 85% MPHR (min:sec): N/A Total Exercise Time (min-sec): 9:39  Workload in METS:  11.10 Borg Scale:   Reason ETT Terminated:  fatigue, dyspnea    ST Segment Analysis At Rest: non-specific ST segment slurring With Exercise: no evidence of significant ST depression  Other Information Arrhythmia:  No Angina during ETT:  absent (0) Quality of ETT:  non-diagnostic  ETT Interpretation:  borderline (indeterminate) with non-specific ST changes  Comments: The patient had a good exercise tolerance.  There was not ST evidence of ischemia. However, he did have a questionable drop in his blood pressure although was apparently difficult to monitor. There were no dysrhythmias noted. At this point this is not diagnostic of high risk findings.   Recommendations: I will bring the patient back to further discuss these results.

## 2015-02-06 ENCOUNTER — Telehealth: Payer: Self-pay | Admitting: Cardiology

## 2015-02-07 NOTE — Telephone Encounter (Signed)
Closed encounter °

## 2015-03-14 ENCOUNTER — Ambulatory Visit: Payer: 59 | Admitting: Cardiology

## 2015-03-28 ENCOUNTER — Other Ambulatory Visit: Payer: Self-pay | Admitting: Cardiology

## 2015-03-28 ENCOUNTER — Other Ambulatory Visit: Payer: Self-pay

## 2015-03-28 MED ORDER — FUROSEMIDE 40 MG PO TABS: ORAL_TABLET | ORAL | Status: DC

## 2015-04-01 ENCOUNTER — Telehealth: Payer: Self-pay | Admitting: Cardiology

## 2015-04-01 NOTE — Telephone Encounter (Signed)
Closed encounter °

## 2015-04-08 ENCOUNTER — Ambulatory Visit (INDEPENDENT_AMBULATORY_CARE_PROVIDER_SITE_OTHER): Payer: 59 | Admitting: Cardiology

## 2015-04-08 ENCOUNTER — Encounter: Payer: Self-pay | Admitting: Cardiology

## 2015-04-08 VITALS — BP 139/86 | HR 86 | Ht 72.0 in | Wt 240.5 lb

## 2015-04-08 DIAGNOSIS — I119 Hypertensive heart disease without heart failure: Secondary | ICD-10-CM | POA: Diagnosis not present

## 2015-04-08 DIAGNOSIS — I43 Cardiomyopathy in diseases classified elsewhere: Principal | ICD-10-CM

## 2015-04-08 DIAGNOSIS — I429 Cardiomyopathy, unspecified: Secondary | ICD-10-CM | POA: Diagnosis not present

## 2015-04-08 NOTE — Patient Instructions (Signed)
Your physician has requested that you have a Dobutamine Stress echocardiogram in February 2017. Echocardiography is a painless test that uses sound waves to create images of your heart. It provides your doctor with information about the size and shape of your heart and how well your heart's chambers and valves are working. This procedure takes approximately one hour. There are no restrictions for this procedure.  Your physician recommends that you schedule a follow-up appointment in: After the Echo in 12/2015.      Marland Kitchen

## 2015-04-08 NOTE — Progress Notes (Signed)
HPI The patient presents for followup of his hypertension and cardiomyopathy. He has a nonischemic cardiomyopathy with an original ejection fraction of 25% to 50% on followup in June of 2012.  He does have some septal hypertrophy. I sent him for an exercise treadmill test. It was reported that he dropped his blood pressure. He was able to go for about 9 minutes and 30 seconds without symptoms however. There was a mention that they had difficulty recording his blood pressure and this could've been artifact. There were no ischemic changes and no dysrhythmias. I brought him back today to discuss this. He said he actually felt very well during that test. He says he works harder than this at times and he has no symptoms. The patient denies any new symptoms such as chest discomfort, neck or arm discomfort. There has been no new shortness of breath, PND or orthopnea. There have been no reported palpitations, presyncope or syncope.  No Known Allergies  Current Outpatient Prescriptions  Medication Sig Dispense Refill  . amLODipine (NORVASC) 10 MG tablet TAKE 1 TABLET BY MOUTH DAILY 30 tablet 7  . aspirin 81 MG tablet Take 81 mg by mouth daily.      . B Complex Vitamins (VITAMIN B COMPLEX PO) Take 1 tablet by mouth daily.      Marland Kitchen BIOTIN PO Take 1 tablet by mouth daily.      . carvedilol (COREG) 25 MG tablet TAKE 2 TABLETS BY MOUTH TWICE A DAY 360 tablet 2  . Cholecalciferol (VITAMIN D-3 PO) Take 1 tablet by mouth daily.    Marland Kitchen CINNAMON PO Take 1 tablet by mouth daily.      . colchicine 0.6 MG tablet Take 1 tablet (0.6 mg total) by mouth 2 (two) times daily. For gout 20 tablet 1  . Cyanocobalamin (VITAMIN B-12 PO) Take 1 tablet by mouth daily.      . enalapril (VASOTEC) 20 MG tablet TAKE 1 TABLET BY MOUTH TWICE DAILY 180 tablet 2  . furosemide (LASIX) 40 MG tablet TAKE 1 TABLET BY MOUTH ONCE DAILY 30 tablet 2  . Garlic TABS Take 1 tablet by mouth daily.      Marland Kitchen ibuprofen (ADVIL,MOTRIN) 200 MG tablet Take 800  mg by mouth 3 (three) times daily as needed. For pain    . indomethacin (INDOCIN) 50 MG capsule Take 1 capsule (50 mg total) by mouth 2 (two) times daily with a meal. 20 capsule 1  . MAGNESIUM OXIDE PO Take 1 tablet by mouth daily.      . naproxen (NAPROSYN) 500 MG tablet Take 1 tablet (500 mg total) by mouth 2 (two) times daily. 20 tablet 0  . niacin 100 MG tablet Take 100 mg by mouth at bedtime.    Marland Kitchen oxyCODONE-acetaminophen (ROXICET) 5-325 MG per tablet Take 1-2 tablets by mouth every 4 (four) hours as needed. 50 tablet 0  . POTASSIUM GLUCONATE PO Take 1 tablet by mouth daily.      . Probiotic Product (PROBIOTIC FORMULA PO) Take 1-3 tablets by mouth daily.    Marland Kitchen zolpidem (AMBIEN) 10 MG tablet Take 1 tablet (10 mg total) by mouth at bedtime as needed for sleep. 30 tablet 3   No current facility-administered medications for this visit.    Past Medical History  Diagnosis Date  . Essential hypertension, benign   . CAD (coronary artery disease)     NONOBSTRUCTIVE  a. cath 8/10: prox to mid LAD 20%  . NICM (nonischemic cardiomyopathy)  a. echo 8/10: EF20-25%, mod LAE, severe asymmetric septal hypertrophy (? nonobs. HCM); grade 1 diast dyfxn.  EF 55% echo June 6  . Chronic systolic heart failure   . CKD (chronic kidney disease)     baseline creatinine approx 1.5  . Hypertrophic nonobstructive cardiomyopathy     septal hypertrophy without LVOT  . Gout   . Hyperlipidemia     Past Surgical History  Procedure Laterality Date  . Knee arthroscopy      right    ROS:  As stated in the HPI and negative for all other systems.  PHYSICAL EXAM BP 139/86 mmHg  Pulse 86  Ht 6' (1.829 m)  Wt 240 lb 8 oz (109.09 kg)  BMI 32.61 kg/m2 GENERAL:  Well appearing HEENT:  Pupils equal round and reactive, fundi not visualized, oral mucosa unremarkable NECK:  No jugular venous distention, waveform within normal limits, carotid upstroke brisk and symmetric, no bruits, no thyromegaly LYMPHATICS:  No  cervical, inguinal adenopathy LUNGS:  Clear to auscultation bilaterally BACK:  No CVA tenderness CHEST:  Unremarkable HEART:  PMI not displaced or sustained,S1 and S2 within normal limits, no S3, no S4, no clicks, no rubs, no murmurs ABD:  Flat, positive bowel sounds normal in frequency in pitch, no bruits, no rebound, no guarding, no midline pulsatile mass, no hepatomegaly, no splenomegaly EXT:  2 plus pulses throughout, no edema, no cyanosis no clubbing   ASSESSMENT AND PLAN  NICM:  He does have some septal hypertrophy. Because of the equivocal treadmill test with the questionably low blood pressure I will bring him back in February of next year and instead of doing a treadmill will order a dobutamine echocardiogram to make sure there is no obstruction to flow.  OBESITY:   The patient understands the need to lose weight with diet and exercise. We have discussed specific strategies for this.  HTN:  His blood pressure is at the upper limits of acceptable. He will stay on the meds as listed.

## 2015-04-11 ENCOUNTER — Ambulatory Visit: Payer: 59 | Admitting: Cardiology

## 2015-06-17 ENCOUNTER — Telehealth: Payer: Self-pay | Admitting: Cardiology

## 2015-06-17 NOTE — Telephone Encounter (Signed)
°  1. Which medications need to be refilled? Enalapril 20 mg-new prescription-changing pharmacy  2. Which pharmacy is medication to be sent to?[phone]  3. Do they need a 30 day or 90 day supply? 90 and refills  4. Would they like a call back once the medication has been sent to the pharmacy? no

## 2015-06-19 NOTE — Telephone Encounter (Signed)
Spoke with pharmacy, refills given.

## 2015-07-16 ENCOUNTER — Other Ambulatory Visit: Payer: Self-pay | Admitting: *Deleted

## 2015-07-16 ENCOUNTER — Other Ambulatory Visit: Payer: Self-pay | Admitting: Cardiology

## 2015-07-16 MED ORDER — ENALAPRIL MALEATE 20 MG PO TABS: ORAL_TABLET | ORAL | Status: DC

## 2015-07-18 ENCOUNTER — Other Ambulatory Visit: Payer: Self-pay

## 2015-07-18 MED ORDER — CARVEDILOL 25 MG PO TABS: 50.0000 mg | ORAL_TABLET | Freq: Two times a day (BID) | ORAL | Status: DC

## 2015-07-18 NOTE — Telephone Encounter (Signed)
Rx(s) sent to pharmacy electronically.  

## 2015-10-16 ENCOUNTER — Other Ambulatory Visit: Payer: Self-pay | Admitting: *Deleted

## 2015-10-16 MED ORDER — FUROSEMIDE 40 MG PO TABS: 40.0000 mg | ORAL_TABLET | Freq: Every day | ORAL | Status: DC

## 2015-10-16 MED ORDER — AMLODIPINE BESYLATE 10 MG PO TABS: 10.0000 mg | ORAL_TABLET | Freq: Every day | ORAL | Status: DC

## 2015-10-30 ENCOUNTER — Other Ambulatory Visit: Payer: Self-pay | Admitting: *Deleted

## 2015-11-04 ENCOUNTER — Other Ambulatory Visit: Payer: Self-pay | Admitting: Internal Medicine

## 2015-11-04 DIAGNOSIS — N50811 Right testicular pain: Secondary | ICD-10-CM

## 2015-11-12 ENCOUNTER — Ambulatory Visit
Admission: RE | Admit: 2015-11-12 | Discharge: 2015-11-12 | Disposition: A | Discharge status: Home / Self Care | Payer: 59 | Source: Ambulatory Visit | Attending: Internal Medicine | Admitting: Internal Medicine

## 2015-11-12 DIAGNOSIS — N50811 Right testicular pain: Secondary | ICD-10-CM

## 2016-01-07 ENCOUNTER — Emergency Department (INDEPENDENT_AMBULATORY_CARE_PROVIDER_SITE_OTHER)
Admission: EM | Admit: 2016-01-07 | Discharge: 2016-01-07 | Disposition: A | Discharge status: Home / Self Care | Payer: Self-pay | Source: Home / Self Care | Attending: Emergency Medicine | Admitting: Emergency Medicine

## 2016-01-07 ENCOUNTER — Encounter (HOSPITAL_COMMUNITY): Payer: Self-pay | Admitting: Emergency Medicine

## 2016-01-07 DIAGNOSIS — M109 Gout, unspecified: Secondary | ICD-10-CM

## 2016-01-07 DIAGNOSIS — M10071 Idiopathic gout, right ankle and foot: Secondary | ICD-10-CM

## 2016-01-07 MED ORDER — TRAMADOL HCL 50 MG PO TABS: 50.0000 mg | ORAL_TABLET | Freq: Four times a day (QID) | ORAL | Status: DC | PRN

## 2016-01-07 MED ORDER — COLCHICINE 0.6 MG PO TABS: ORAL_TABLET | ORAL | Status: DC

## 2016-01-07 MED ORDER — NAPROXEN 500 MG PO TABS: 500.0000 mg | ORAL_TABLET | Freq: Two times a day (BID) | ORAL | Status: DC

## 2016-01-07 NOTE — ED Notes (Signed)
Pt has been suffering from right foot pain for 3 days.  He states it is from gout, that he has had in the past.

## 2016-01-07 NOTE — ED Provider Notes (Signed)
CSN: [id number]     Arrival date & time 01/07/16  1411 History   First MD Initiated Contact with Patient 01/07/16 1532     Chief Complaint  Patient presents with  . Gout    right foot   (Consider location/radiation/quality/duration/timing/severity/associated sxs/prior Treatment) HPI  He is a 60-year-old man here for right foot and ankle pain. He states this started 2-3 days ago. It is tender and swollen in the lateral ankle and lateral foot. He states this feels like previous gout flares. He does not have any medication remaining at home. No injury or trauma. He has not taken anything.  Past Medical History  Diagnosis Date  . Essential hypertension, benign   . CAD (coronary artery disease)     NONOBSTRUCTIVE  a. cath 8/10: prox to mid LAD 20%  . NICM (nonischemic cardiomyopathy) (HCC)     a. echo 8/10: EF20-25%, mod LAE, severe asymmetric septal hypertrophy (? nonobs. HCM); grade 1 diast dyfxn.  EF 55% echo June 6  . Chronic systolic heart failure (HCC)   . CKD (chronic kidney disease)     baseline creatinine approx 1.5  . Hypertrophic nonobstructive cardiomyopathy (HCC)     septal hypertrophy without LVOT  . Gout   . Hyperlipidemia    Past Surgical History  Procedure Laterality Date  . Knee arthroscopy      right   Family History  Problem Relation Age of Onset  . Kidney disease Brother   . Cancer Brother     prostate  . Kidney disease Father   . Diabetes Mother   . Heart attack Mother   . Heart disease Mother   . Cancer Sister     breast   Social History  Substance Use Topics  . Smoking status: Never Smoker   . Smokeless tobacco: Never Used  . Alcohol Use: No    Review of Systems As in history of present illness Allergies  Review of patient's allergies indicates no known allergies.  Home Medications   Prior to Admission medications   Medication Sig Start Date End Date Taking? Authorizing Provider  amLODipine (NORVASC) 10 MG tablet Take 1 tablet (10 mg total)  by mouth daily. 10/16/15  Yes Rollene Rotunda, MD  carvedilol (COREG) 25 MG tablet Take 2 tablets (50 mg total) by mouth 2 (two) times daily with a meal. TAKE 2 TABLETS BY MOUTH TWICE A DAY 07/18/15  Yes Rollene Rotunda, MD  furosemide (LASIX) 40 MG tablet Take 1 tablet (40 mg total) by mouth daily. 10/16/15  Yes Rollene Rotunda, MD  aspirin 81 MG tablet Take 81 mg by mouth daily.      Historical Provider, MD  B Complex Vitamins (VITAMIN B COMPLEX PO) Take 1 tablet by mouth daily.      Historical Provider, MD  BIOTIN PO Take 1 tablet by mouth daily.      Historical Provider, MD  Cholecalciferol (VITAMIN D-3 PO) Take 1 tablet by mouth daily.    Historical Provider, MD  CINNAMON PO Take 1 tablet by mouth daily.      Historical Provider, MD  colchicine 0.6 MG tablet Take 2 tablets now, repeat 1 tablet 1 hour later. 01/07/16   Charm Rings, MD  Cyanocobalamin (VITAMIN B-12 PO) Take 1 tablet by mouth daily.      Historical Provider, MD  enalapril (VASOTEC) 20 MG tablet TAKE 1 TABLET BY MOUTH TWICE DAILY 07/16/15   Rollene Rotunda, MD  Garlic TABS Take 1 tablet by mouth daily.  Historical Provider, MD  MAGNESIUM OXIDE PO Take 1 tablet by mouth daily.      Historical Provider, MD  naproxen (NAPROSYN) 500 MG tablet Take 1 tablet (500 mg total) by mouth 2 (two) times daily. 01/07/16   Charm Rings, MD  niacin 100 MG tablet Take 100 mg by mouth at bedtime.    Historical Provider, MD  POTASSIUM GLUCONATE PO Take 1 tablet by mouth daily.      Historical Provider, MD  Probiotic Product (PROBIOTIC FORMULA PO) Take 1-3 tablets by mouth daily.    Historical Provider, MD  traMADol (ULTRAM) 50 MG tablet Take 1 tablet (50 mg total) by mouth every 6 (six) hours as needed for severe pain. 01/07/16   Charm Rings, MD  zolpidem (AMBIEN) 10 MG tablet Take 1 tablet (10 mg total) by mouth at bedtime as needed for sleep. 10/02/14 05/23/16  Rollene Rotunda, MD   Meds Ordered and Administered this Visit  Medications - No data to  display  BP 145/87 mmHg  Pulse 69  Temp(Src) 98.4 F (36.9 C) (Oral)  Resp 20  SpO2 98% No data found.   Physical Exam  Constitutional: He is oriented to person, place, and time. He appears well-developed and well-nourished. No distress.  Cardiovascular: Normal rate.   Pulmonary/Chest: Effort normal.  Musculoskeletal:  Right foot: He does have some erythema and swelling at the lateral foot and ankle. This is slightly tender to palpation. No bony tenderness.  Neurological: He is alert and oriented to person, place, and time.    ED Course  Procedures (including critical care time)  Labs Review Labs Reviewed - No data to display  Imaging Review No results found.   MDM   1. Acute gout of right foot, unspecified cause    We'll treat with colchicine and Naprosyn. Prescription given for tramadol to use if needed for severe pain. Follow-up if not improving in the next several days.    Charm Rings, MD 01/07/16 1556

## 2016-01-07 NOTE — Discharge Instructions (Signed)
This is likely a gout flare. Take colchicine as prescribed. Take Naprosyn twice a day for pain. Use the tramadol every 6-8 hours as needed for severe pain. Do not drive while taking this medicine. Follow-up if not improving in the next several days.

## 2016-01-14 ENCOUNTER — Ambulatory Visit: Payer: 59 | Admitting: Cardiology

## 2016-03-02 ENCOUNTER — Emergency Department (HOSPITAL_COMMUNITY): Payer: BLUE CROSS/BLUE SHIELD

## 2016-03-02 ENCOUNTER — Encounter (HOSPITAL_COMMUNITY): Payer: Self-pay | Admitting: *Deleted

## 2016-03-02 ENCOUNTER — Emergency Department (HOSPITAL_COMMUNITY)
Admission: EM | Admit: 2016-03-02 | Discharge: 2016-03-02 | Disposition: A | Discharge status: Home / Self Care | Payer: BLUE CROSS/BLUE SHIELD | Attending: Emergency Medicine | Admitting: Emergency Medicine

## 2016-03-02 ENCOUNTER — Ambulatory Visit (HOSPITAL_COMMUNITY)
Admission: EM | Admit: 2016-03-02 | Discharge: 2016-03-02 | Disposition: A | Discharge status: Nursing Home (Includes ICF) | Payer: BLUE CROSS/BLUE SHIELD | Source: Home / Self Care | Attending: Family Medicine | Admitting: Family Medicine

## 2016-03-02 ENCOUNTER — Encounter (HOSPITAL_COMMUNITY): Payer: Self-pay

## 2016-03-02 DIAGNOSIS — Z791 Long term (current) use of non-steroidal anti-inflammatories (NSAID): Secondary | ICD-10-CM | POA: Diagnosis not present

## 2016-03-02 DIAGNOSIS — H81399 Other peripheral vertigo, unspecified ear: Secondary | ICD-10-CM | POA: Diagnosis not present

## 2016-03-02 DIAGNOSIS — N189 Chronic kidney disease, unspecified: Secondary | ICD-10-CM | POA: Diagnosis not present

## 2016-03-02 DIAGNOSIS — Z79899 Other long term (current) drug therapy: Secondary | ICD-10-CM | POA: Insufficient documentation

## 2016-03-02 DIAGNOSIS — R42 Dizziness and giddiness: Secondary | ICD-10-CM

## 2016-03-02 DIAGNOSIS — Z7982 Long term (current) use of aspirin: Secondary | ICD-10-CM | POA: Diagnosis not present

## 2016-03-02 DIAGNOSIS — E785 Hyperlipidemia, unspecified: Secondary | ICD-10-CM | POA: Insufficient documentation

## 2016-03-02 DIAGNOSIS — I2510 Atherosclerotic heart disease of native coronary artery without angina pectoris: Secondary | ICD-10-CM | POA: Insufficient documentation

## 2016-03-02 DIAGNOSIS — I5022 Chronic systolic (congestive) heart failure: Secondary | ICD-10-CM | POA: Insufficient documentation

## 2016-03-02 DIAGNOSIS — I129 Hypertensive chronic kidney disease with stage 1 through stage 4 chronic kidney disease, or unspecified chronic kidney disease: Secondary | ICD-10-CM | POA: Insufficient documentation

## 2016-03-02 LAB — COMPREHENSIVE METABOLIC PANEL
ALT: 40 U/L (ref 17–63)
AST: 39 U/L (ref 15–41)
Albumin: 4.2 g/dL (ref 3.5–5.0)
Alkaline Phosphatase: 46 U/L (ref 38–126)
Anion gap: 12 (ref 5–15)
BUN: 24 mg/dL — ABNORMAL HIGH (ref 6–20)
CO2: 26 mmol/L (ref 22–32)
Calcium: 9.6 mg/dL (ref 8.9–10.3)
Chloride: 100 mmol/L — ABNORMAL LOW (ref 101–111)
Creatinine, Ser: 1.16 mg/dL (ref 0.61–1.24)
GFR calc Af Amer: >60 mL/min (ref >60)
GFR calc non Af Amer: >60 mL/min (ref >60)
Glucose, Bld: 101 mg/dL — ABNORMAL HIGH (ref 65–99)
Potassium: 3.7 mmol/L (ref 3.5–5.1)
Sodium: 138 mmol/L (ref 135–145)
Total Bilirubin: 0.6 mg/dL (ref 0.3–1.2)
Total Protein: 7.7 g/dL (ref 6.5–8.1)

## 2016-03-02 LAB — DIFFERENTIAL
Basophils Absolute: 0.1 10*3/uL (ref 0.0–0.1)
Basophils Relative: 1 %
Eosinophils Absolute: 0.3 10*3/uL (ref 0.0–0.7)
Eosinophils Relative: 4 %
Lymphocytes Relative: 25 %
Lymphs Abs: 2 10*3/uL (ref 0.7–4.0)
Monocytes Absolute: 0.6 10*3/uL (ref 0.1–1.0)
Monocytes Relative: 7 %
Neutro Abs: 5 10*3/uL (ref 1.7–7.7)
Neutrophils Relative %: 63 %

## 2016-03-02 LAB — I-STAT CHEM 8, ED
BUN: 27 mg/dL — ABNORMAL HIGH (ref 6–20)
Calcium, Ion: 1.18 mmol/L (ref 1.13–1.30)
Chloride: 101 mmol/L (ref 101–111)
Creatinine, Ser: 1.1 mg/dL (ref 0.61–1.24)
Glucose, Bld: 93 mg/dL (ref 65–99)
HCT: 47 % (ref 39.0–52.0)
Hemoglobin: 16 g/dL (ref 13.0–17.0)
Potassium: 3.6 mmol/L (ref 3.5–5.1)
Sodium: 140 mmol/L (ref 135–145)
TCO2: 27 mmol/L (ref 0–100)

## 2016-03-02 LAB — CBC
HCT: 41.3 % (ref 39.0–52.0)
Hemoglobin: 13.6 g/dL (ref 13.0–17.0)
MCH: 22.5 pg — ABNORMAL LOW (ref 26.0–34.0)
MCHC: 32.9 g/dL (ref 30.0–36.0)
MCV: 68.4 fL — ABNORMAL LOW (ref 78.0–100.0)
Platelets: 166 10*3/uL (ref 150–400)
RBC: 6.04 MIL/uL — ABNORMAL HIGH (ref 4.22–5.81)
RDW: 18.5 % — ABNORMAL HIGH (ref 11.5–15.5)
WBC: 8 10*3/uL (ref 4.0–10.5)

## 2016-03-02 LAB — APTT: aPTT: 27 seconds (ref 24–37)

## 2016-03-02 LAB — PROTIME-INR
INR: 1.06 (ref 0.00–1.49)
Prothrombin Time: 14 seconds (ref 11.6–15.2)

## 2016-03-02 LAB — I-STAT TROPONIN, ED: Troponin i, poc: 0.05 ng/mL (ref 0.00–0.08)

## 2016-03-02 MED ORDER — MECLIZINE HCL 25 MG PO TABS: 25.0000 mg | ORAL_TABLET | Freq: Three times a day (TID) | ORAL | Status: DC | PRN

## 2016-03-02 MED ORDER — MECLIZINE HCL 25 MG PO TABS
25.0000 mg | ORAL_TABLET | Freq: Once | ORAL | Status: AC
  Administered 2016-03-02: 25 mg via ORAL
  Filled 2016-03-02: qty 1

## 2016-03-02 NOTE — ED Notes (Signed)
Pt  Reports   Dizzy     X  3  Days     Reports     sev  Days  Ago     He    Had  palpataions            Pt  denys  Any  Chest  Pain   denys  Any  Shortness  Of  Breath      Pt  Reports  He  Has  Been taking  His  meds   He  Is  Awake  And  Alert and  Oriented    he  denys  Any  Chest pain

## 2016-03-02 NOTE — ED Provider Notes (Signed)
CSN: [id number]     Arrival date & time 03/02/16  1543 History   First MD Initiated Contact with Patient 03/02/16 1719     Chief Complaint  Patient presents with  . Dizziness   (Consider location/radiation/quality/duration/timing/severity/associated sxs/prior Treatment) Patient is a 60 y.o. male presenting with dizziness. The history is provided by the patient.  Dizziness Quality:  Room spinning Severity:  Mild Onset quality:  Sudden Duration:  4 days Timing:  Intermittent Progression:  Waxing and waning Chronicity:  New Context: head movement   Context: not with ear pain and not with loss of consciousness   Relieved by:  None tried Worsened by:  Nothing Ineffective treatments:  None tried Associated symptoms: nausea and palpitations   Associated symptoms: no headaches, no shortness of breath and no weakness     Past Medical History  Diagnosis Date  . Essential hypertension, benign   . CAD (coronary artery disease)     NONOBSTRUCTIVE  a. cath 8/10: prox to mid LAD 20%  . NICM (nonischemic cardiomyopathy) (HCC)     a. echo 8/10: EF20-25%, mod LAE, severe asymmetric septal hypertrophy (? nonobs. HCM); grade 1 diast dyfxn.  EF 55% echo June 6  . Chronic systolic heart failure (HCC)   . CKD (chronic kidney disease)     baseline creatinine approx 1.5  . Hypertrophic nonobstructive cardiomyopathy (HCC)     septal hypertrophy without LVOT  . Gout   . Hyperlipidemia    Past Surgical History  Procedure Laterality Date  . Knee arthroscopy      right   Family History  Problem Relation Age of Onset  . Kidney disease Brother   . Cancer Brother     prostate  . Kidney disease Father   . Diabetes Mother   . Heart attack Mother   . Heart disease Mother   . Cancer Sister     breast   Social History  Substance Use Topics  . Smoking status: Never Smoker   . Smokeless tobacco: Never Used  . Alcohol Use: No    Review of Systems  Constitutional: Negative.   HENT: Negative.    Respiratory: Negative.  Negative for shortness of breath.   Cardiovascular: Positive for palpitations.  Gastrointestinal: Positive for nausea.  Neurological: Positive for dizziness. Negative for speech difficulty, weakness, light-headedness, numbness and headaches.  All other systems reviewed and are negative.   Allergies  Review of patient's allergies indicates no known allergies.  Home Medications   Prior to Admission medications   Medication Sig Start Date End Date Taking? Authorizing Provider  amLODipine (NORVASC) 10 MG tablet Take 1 tablet (10 mg total) by mouth daily. 10/16/15   Rollene Rotunda, MD  aspirin 81 MG tablet Take 81 mg by mouth daily.      Historical Provider, MD  B Complex Vitamins (VITAMIN B COMPLEX PO) Take 1 tablet by mouth daily.      Historical Provider, MD  BIOTIN PO Take 1 tablet by mouth daily.      Historical Provider, MD  carvedilol (COREG) 25 MG tablet Take 2 tablets (50 mg total) by mouth 2 (two) times daily with a meal. TAKE 2 TABLETS BY MOUTH TWICE A DAY 07/18/15   Rollene Rotunda, MD  Cholecalciferol (VITAMIN D-3 PO) Take 1 tablet by mouth daily.    Historical Provider, MD  CINNAMON PO Take 1 tablet by mouth daily.      Historical Provider, MD  colchicine 0.6 MG tablet Take 2 tablets now, repeat 1  tablet 1 hour later. 01/07/16   Charm Rings, MD  Cyanocobalamin (VITAMIN B-12 PO) Take 1 tablet by mouth daily.      Historical Provider, MD  enalapril (VASOTEC) 20 MG tablet TAKE 1 TABLET BY MOUTH TWICE DAILY 07/16/15   Rollene Rotunda, MD  furosemide (LASIX) 40 MG tablet Take 1 tablet (40 mg total) by mouth daily. 10/16/15   Rollene Rotunda, MD  Garlic TABS Take 1 tablet by mouth daily.      Historical Provider, MD  MAGNESIUM OXIDE PO Take 1 tablet by mouth daily.      Historical Provider, MD  naproxen (NAPROSYN) 500 MG tablet Take 1 tablet (500 mg total) by mouth 2 (two) times daily. 01/07/16   Charm Rings, MD  niacin 100 MG tablet Take 100 mg by mouth at bedtime.     Historical Provider, MD  POTASSIUM GLUCONATE PO Take 1 tablet by mouth daily.      Historical Provider, MD  Probiotic Product (PROBIOTIC FORMULA PO) Take 1-3 tablets by mouth daily.    Historical Provider, MD  traMADol (ULTRAM) 50 MG tablet Take 1 tablet (50 mg total) by mouth every 6 (six) hours as needed for severe pain. 01/07/16   Charm Rings, MD  zolpidem (AMBIEN) 10 MG tablet Take 1 tablet (10 mg total) by mouth at bedtime as needed for sleep. 10/02/14 05/23/16  Rollene Rotunda, MD   Meds Ordered and Administered this Visit  Medications - No data to display  BP 152/102 mmHg  Pulse 64  Temp(Src) 98.1 F (36.7 C) (Oral)  Resp 18  SpO2 100% No data found.   Physical Exam  Constitutional: He is oriented to person, place, and time. He appears well-developed and well-nourished. No distress.  HENT:  Right Ear: External ear normal.  Left Ear: External ear normal.  Mouth/Throat: Oropharynx is clear and moist.  Eyes: Conjunctivae and EOM are normal. Pupils are equal, round, and reactive to light.  Neck: Normal range of motion. Neck supple.  Cardiovascular: Normal rate, regular rhythm, normal heart sounds and intact distal pulses.   Pulmonary/Chest: Effort normal and breath sounds normal.  Lymphadenopathy:    He has no cervical adenopathy.  Neurological: He is alert and oriented to person, place, and time.  Skin: Skin is warm and dry.  Nursing note and vitals reviewed.   ED Course  Procedures (including critical care time)  Labs Review Labs Reviewed - No data to display  Imaging Review No results found.   Visual Acuity Review  Right Eye Distance:   Left Eye Distance:   Bilateral Distance:    Right Eye Near:   Left Eye Near:    Bilateral Near:         MDM   1. Vertigo    Sent for eval of persistent vertigo., sx of nausea increasing.   Linna Hoff, MD 03/02/16 [phone]

## 2016-03-02 NOTE — ED Notes (Signed)
Onset 3 days ago dizziness, intermittant. Pt was sent from urgent care.  No chest pain, shortness of breath, nausea.

## 2016-03-02 NOTE — ED Provider Notes (Signed)
CSN: [id number]     Arrival date & time 03/02/16  1749 History   First MD Initiated Contact with Patient 03/02/16 2028     Chief Complaint  Patient presents with  . Dizziness     (Consider location/radiation/quality/duration/timing/severity/associated sxs/prior Treatment) HPI  60 year old male presents with intermittent dizziness over the last 3 days. Comes and goes, mostly when turning his head, rolling over, or standing up from bending over. Denies headaches. No nausea or vomiting. No blurry vision. Patient states he doesn't quite feel right has a vague sensation in his head while he is at rest but it is not acutely spinning. Chest pain, short of breath, or vomiting. No weakness or numbness. Has never had this before.  Past Medical History  Diagnosis Date  . Essential hypertension, benign   . CAD (coronary artery disease)     NONOBSTRUCTIVE  a. cath 8/10: prox to mid LAD 20%  . NICM (nonischemic cardiomyopathy) (HCC)     a. echo 8/10: EF20-25%, mod LAE, severe asymmetric septal hypertrophy (? nonobs. HCM); grade 1 diast dyfxn.  EF 55% echo June 6  . Chronic systolic heart failure (HCC)   . CKD (chronic kidney disease)     baseline creatinine approx 1.5  . Hypertrophic nonobstructive cardiomyopathy (HCC)     septal hypertrophy without LVOT  . Gout   . Hyperlipidemia    Past Surgical History  Procedure Laterality Date  . Knee arthroscopy      right   Family History  Problem Relation Age of Onset  . Kidney disease Brother   . Cancer Brother     prostate  . Kidney disease Father   . Diabetes Mother   . Heart attack Mother   . Heart disease Mother   . Cancer Sister     breast   Social History  Substance Use Topics  . Smoking status: Never Smoker   . Smokeless tobacco: Never Used  . Alcohol Use: No    Review of Systems  HENT: Negative for ear pain and tinnitus.   Eyes: Negative for visual disturbance.  Cardiovascular: Negative for chest pain.  Gastrointestinal:  Negative for nausea, vomiting and abdominal pain.  Neurological: Positive for dizziness. Negative for weakness, numbness and headaches.  All other systems reviewed and are negative.     Allergies  Review of patient's allergies indicates no known allergies.  Home Medications   Prior to Admission medications   Medication Sig Start Date End Date Taking? Authorizing Provider  amLODipine (NORVASC) 10 MG tablet Take 1 tablet (10 mg total) by mouth daily. 10/16/15  Yes Rollene Rotunda, MD  aspirin 81 MG tablet Take 81 mg by mouth daily.     Yes Historical Provider, MD  B Complex Vitamins (VITAMIN B COMPLEX PO) Take 1 tablet by mouth daily.     Yes Historical Provider, MD  carvedilol (COREG) 25 MG tablet Take 2 tablets (50 mg total) by mouth 2 (two) times daily with a meal. TAKE 2 TABLETS BY MOUTH TWICE A DAY 07/18/15  Yes Rollene Rotunda, MD  Cholecalciferol (VITAMIN D-3 PO) Take 1 tablet by mouth daily.   Yes Historical Provider, MD  CINNAMON PO Take 1 tablet by mouth daily.     Yes Historical Provider, MD  Cyanocobalamin (VITAMIN B-12 PO) Take 1 tablet by mouth daily.     Yes Historical Provider, MD  enalapril (VASOTEC) 20 MG tablet TAKE 1 TABLET BY MOUTH TWICE DAILY 07/16/15  Yes Rollene Rotunda, MD  furosemide (LASIX) 40 MG  tablet Take 1 tablet (40 mg total) by mouth daily. 10/16/15  Yes Rollene Rotunda, MD  Garlic TABS Take 1 tablet by mouth daily.     Yes Historical Provider, MD  naproxen (NAPROSYN) 500 MG tablet Take 1 tablet (500 mg total) by mouth 2 (two) times daily. 01/07/16  Yes Charm Rings, MD  niacin 100 MG tablet Take 100 mg by mouth at bedtime.   Yes Historical Provider, MD  zolpidem (AMBIEN) 10 MG tablet Take 1 tablet (10 mg total) by mouth at bedtime as needed for sleep. 10/02/14 05/23/16 Yes Rollene Rotunda, MD  colchicine 0.6 MG tablet Take 2 tablets now, repeat 1 tablet 1 hour later. Patient not taking: Reported on 03/02/2016 01/07/16   Charm Rings, MD  traMADol (ULTRAM) 50 MG tablet  Take 1 tablet (50 mg total) by mouth every 6 (six) hours as needed for severe pain. Patient not taking: Reported on 03/02/2016 01/07/16   Charm Rings, MD   BP 155/96 mmHg  Pulse 60  Temp(Src) 97.7 F (36.5 C) (Oral)  Resp 16  Ht 6' (1.829 m)  Wt 232 lb 12.8 oz (105.597 kg)  BMI 31.57 kg/m2  SpO2 100% Physical Exam  Constitutional: He is oriented to person, place, and time. He appears well-developed and well-nourished.  HENT:  Head: Normocephalic and atraumatic.  Right Ear: Tympanic membrane and external ear normal.  Left Ear: Tympanic membrane and external ear normal.  Nose: Nose normal.  Eyes: EOM are normal. Pupils are equal, round, and reactive to light. Right eye exhibits no discharge. Left eye exhibits no discharge.  Neck: Normal range of motion. Neck supple.  Cardiovascular: Normal rate, regular rhythm, normal heart sounds and intact distal pulses.   Pulmonary/Chest: Effort normal and breath sounds normal.  Abdominal: Soft. He exhibits no distension. There is no tenderness.  Musculoskeletal: He exhibits no edema.  Neurological: He is alert and oriented to person, place, and time.  CN 2-12 grossly intact. 5/5 strength in all 4 extremities. Grossly normal sensation. Right finger to nose normal. Left finger to nose with some difficulty but gets to nose. Gait with some leaning to left  Skin: Skin is warm and dry.  Nursing note and vitals reviewed.   ED Course  Procedures (including critical care time) Labs Review Labs Reviewed  CBC - Abnormal; Notable for the following:    RBC 6.04 (*)    MCV 68.4 (*)    MCH 22.5 (*)    RDW 18.5 (*)    All other components within normal limits  COMPREHENSIVE METABOLIC PANEL - Abnormal; Notable for the following:    Chloride 100 (*)    Glucose, Bld 101 (*)    BUN 24 (*)    All other components within normal limits  I-STAT CHEM 8, ED - Abnormal; Notable for the following:    BUN 27 (*)    All other components within normal limits   PROTIME-INR  APTT  DIFFERENTIAL  I-STAT TROPOININ, ED    Imaging Review Ct Head Wo Contrast  03/02/2016  CLINICAL DATA:  60 year old presenting with severe dizziness over the past several days. Current history of hypertension. EXAM: CT HEAD WITHOUT CONTRAST TECHNIQUE: Contiguous axial images were obtained from the base of the skull through the vertex without intravenous contrast. COMPARISON:  None. FINDINGS: Ventricular system normal in size and appearance for age. No significant atrophy for age. No mass lesion. No midline shift. No acute hemorrhage or hematoma. No extra-axial fluid collections. No evidence of  acute infarction. No focal brain parenchymal abnormality. No skull fracture or other focal osseous abnormality involving the skull. Mucous retention cysts or polyps in the maxillary sinuses. Remaining visualized paranasal sinuses well aerated. Opacification of right mastoid air cells. Left mastoid air cells and bilateral middle ear cavities well-aerated. IMPRESSION: 1. Normal intracranially. 2. Chronic bilateral maxillary sinus disease with mucous retention cysts or polyps. 3. Right mastoid effusion. Electronically Signed   By: Hulan Saas M.D.   On: 03/02/2016 18:39   Mr Brain Wo Contrast  03/02/2016  CLINICAL DATA:  Initial evaluation for acute vertigo. EXAM: MRI HEAD WITHOUT CONTRAST TECHNIQUE: Multiplanar, multiecho pulse sequences of the brain and surrounding structures were obtained without intravenous contrast. COMPARISON:  Prior CT from earlier same day. FINDINGS: Age appropriate cerebral blunt loss present. No significant white matter disease. No abnormal foci of restricted diffusion to suggest acute intracranial infarct. Gray-white matter differentiation maintained. Major intracranial vascular flow voids are preserved. No acute or chronic intracranial hemorrhage. No areas of chronic infarction. No mass lesion, midline shift, or mass effect. No hydrocephalus. No extra-axial fluid  collection. Major dural sinuses are patent. Craniocervical junction within normal limits. Degenerative spondylolysis noted at C3-4 with secondary mild stenosis. Pituitary gland within normal limits. No acute abnormality about the orbits. Retention cyst noted within the maxillary sinuses bilaterally. Mild scattered mucosal thickening within ethmoidal air cells. Right mastoid effusion present. Inner ear structures grossly normal. Bone marrow signal intensity within normal limits. Small lipoma noted at the right frontal scalp. No acute scalp soft tissue abnormality. IMPRESSION: 1. No acute intracranial infarct or other process identified. 2. Right mastoid effusion. Electronically Signed   By: Rise Mu M.D.   On: 03/02/2016 22:25   I have personally reviewed and evaluated these images and lab results as part of my medical decision-making.   EKG Interpretation   Date/Time:  Tuesday March 02 2016 18:15:05 EDT Ventricular Rate:  60 PR Interval:  236 QRS Duration: 134 QT Interval:  428 QTC Calculation: 428 R Axis:   -54 Text Interpretation:  Sinus rhythm with 1st degree A-V block Non-specific  intra-ventricular conduction block T wave abnormality, consider  inferolateral ischemia Abnormal ECG agree. no sig change from old  Confirmed by Donnald Garre, MD, Lebron Conners [phone]) on 03/02/2016 6:19:21 PM      MDM   Final diagnoses:  Peripheral vertigo, unspecified laterality    Patient's symptoms have resolved with antivert. Given CVA risk factors an MRI was obtained and is negative. Now ambulates without difficulty. Will d/c with antivert prn and discussed Epley maneuver at home. F/u with PCP. Discussed return precautions.    Pricilla Loveless, MD 03/02/16 [phone]

## 2016-03-02 NOTE — ED Notes (Signed)
Patient transported to CT 

## 2016-03-02 NOTE — ED Notes (Signed)
Pt A&Ox4, ambulatory at d/c with steady gait, NAD 

## 2016-03-03 NOTE — Progress Notes (Signed)
Same day cancellation

## 2016-03-04 ENCOUNTER — Encounter: Payer: Self-pay | Admitting: Cardiology

## 2016-04-21 NOTE — Progress Notes (Signed)
HPI The patient presents for followup of his hypertension and cardiomyopathy. He has a nonischemic cardiomyopathy with an original ejection fraction of 25% to 50% on followup in 2012 and 14.  He does have some septal hypertrophy. I sent him for an exercise treadmill test. It was reported that he dropped his blood pressure. He was able to go for about 9 minutes and 30 seconds without symptoms however. I was going to get a dobutamine echocardiogram to look for high risk findings and inducible gradient. However, he canceled this appointment. He returns for follow-up. He's not having any new symptoms.  However, is not exercising as much as I would like. His job keeps him from this.  The patient denies any new symptoms such as chest discomfort, neck or arm discomfort. There has been no new shortness of breath, PND or orthopnea. There have been no reported palpitations, presyncope or syncope.  No Known Allergies  Current Outpatient Prescriptions  Medication Sig Dispense Refill  . amLODipine (NORVASC) 10 MG tablet Take 1 tablet (10 mg total) by mouth daily. 90 tablet 1  . aspirin 81 MG tablet Take 81 mg by mouth daily.      . B Complex Vitamins (VITAMIN B COMPLEX PO) Take 1 tablet by mouth daily.      . carvedilol (COREG) 25 MG tablet Take 2 tablets (50 mg total) by mouth 2 (two) times daily with a meal. TAKE 2 TABLETS BY MOUTH TWICE A DAY 360 tablet 2  . Cholecalciferol (VITAMIN D-3 PO) Take 1 tablet by mouth daily.    Marland Kitchen CINNAMON PO Take 1 tablet by mouth daily.      . colchicine 0.6 MG tablet Take 2 tablets now, repeat 1 tablet 1 hour later. 3 tablet 0  . Cyanocobalamin (VITAMIN B-12 PO) Take 1 tablet by mouth daily.      . enalapril (VASOTEC) 20 MG tablet TAKE 1 TABLET BY MOUTH TWICE DAILY 180 tablet 3  . furosemide (LASIX) 40 MG tablet Take 1 tablet (40 mg total) by mouth daily. 90 tablet 1  . Garlic TABS Take 1 tablet by mouth daily.      . meclizine (ANTIVERT) 25 MG tablet Take 1 tablet (25 mg  total) by mouth 3 (three) times daily as needed for dizziness. 15 tablet 0  . naproxen (NAPROSYN) 500 MG tablet Take 1 tablet (500 mg total) by mouth 2 (two) times daily. 30 tablet 0  . niacin 100 MG tablet Take 100 mg by mouth at bedtime.    . traMADol (ULTRAM) 50 MG tablet Take 1 tablet (50 mg total) by mouth every 6 (six) hours as needed for severe pain. 10 tablet 0  . zolpidem (AMBIEN) 10 MG tablet Take 1 tablet (10 mg total) by mouth at bedtime as needed for sleep. 30 tablet 3   No current facility-administered medications for this visit.    Past Medical History  Diagnosis Date  . Essential hypertension, benign   . CAD (coronary artery disease)     NONOBSTRUCTIVE  a. cath 8/10: prox to mid LAD 20%  . NICM (nonischemic cardiomyopathy) (HCC)     a. echo 8/10: EF20-25%, mod LAE, severe asymmetric septal hypertrophy (? nonobs. HCM); grade 1 diast dyfxn.  EF 55% echo June 6  . Chronic systolic heart failure (HCC)   . CKD (chronic kidney disease)     baseline creatinine approx 1.5  . Hypertrophic nonobstructive cardiomyopathy (HCC)     septal hypertrophy without LVOT  . Gout   .  Hyperlipidemia     Past Surgical History  Procedure Laterality Date  . Knee arthroscopy      right    ROS:  As stated in the HPI and negative for all other systems.  PHYSICAL EXAM BP 122/78 mmHg  Pulse 73  Ht 6' (1.829 m)  Wt 232 lb (105.235 kg)  BMI 31.46 kg/m2 GENERAL:  Well appearing HEENT:  Pupils equal round and reactive, fundi not visualized, oral mucosa unremarkable NECK:  No jugular venous distention, waveform within normal limits, carotid upstroke brisk and symmetric, no bruits, no thyromegaly LYMPHATICS:  No cervical, inguinal adenopathy LUNGS:  Clear to auscultation bilaterally BACK:  No CVA tenderness CHEST:  Unremarkable HEART:  PMI not displaced or sustained,S1 and S2 within normal limits, no S3, no S4, no clicks, no rubs, no murmurs ABD:  Flat, positive bowel sounds normal in  frequency in pitch, no bruits, no rebound, no guarding, no midline pulsatile mass, no hepatomegaly, no splenomegaly EXT:  2 plus pulses throughout, no edema, no cyanosis no clubbing  EKG:  03/04/16  sinus rhythm, rate 60, left ventricle hypertrophy with repolarization changes and a leftward axis unchanged from previous.  04/22/2016   ASSESSMENT AND PLAN  NICM:  He does have some septal hypertrophy. He had an equivocal treadmill test with the questionably low blood pressure .  I had wanted him to have a dobutamine echo to look for subvalvular gradient and high risk findings.  He agrees now to have this done.   HTN:  His blood pressure is at the upper limits of acceptable. He will stay on the meds as listed.

## 2016-04-22 ENCOUNTER — Encounter: Payer: Self-pay | Admitting: Cardiology

## 2016-04-22 ENCOUNTER — Ambulatory Visit (INDEPENDENT_AMBULATORY_CARE_PROVIDER_SITE_OTHER): Payer: BLUE CROSS/BLUE SHIELD | Admitting: Cardiology

## 2016-04-22 VITALS — BP 122/78 | HR 73 | Ht 72.0 in | Wt 232.0 lb

## 2016-04-22 DIAGNOSIS — I422 Other hypertrophic cardiomyopathy: Secondary | ICD-10-CM

## 2016-04-22 NOTE — Patient Instructions (Signed)
Medication Instructions:  Continue current medication therapy  Labwork: NONE  Testing/Procedures: Your physician has requested that you have a stress echocardiogram. For further information please visit [email or web address] Please follow instruction sheet as given.  Follow-Up: Your physician wants you to follow-up in: 1 Year. You will receive a reminder letter in the mail two months in advance. If you don't receive a letter, please call our office to schedule the follow-up appointment.   Any Other Special Instructions Will Be Listed Below (If Applicable).  If you need a refill on your cardiac medications before your next appointment, please call your pharmacy.

## 2016-05-04 ENCOUNTER — Other Ambulatory Visit: Payer: Self-pay | Admitting: *Deleted

## 2016-05-04 ENCOUNTER — Other Ambulatory Visit: Payer: Self-pay | Admitting: Cardiology

## 2016-05-04 MED ORDER — AMLODIPINE BESYLATE 10 MG PO TABS: 10.0000 mg | ORAL_TABLET | Freq: Every day | ORAL | Status: DC

## 2016-05-04 MED ORDER — FUROSEMIDE 40 MG PO TABS: 40.0000 mg | ORAL_TABLET | Freq: Every day | ORAL | Status: DC

## 2016-05-04 NOTE — Telephone Encounter (Signed)
Rx(s) sent to pharmacy electronically.  

## 2016-05-19 ENCOUNTER — Other Ambulatory Visit (HOSPITAL_COMMUNITY): Payer: BLUE CROSS/BLUE SHIELD

## 2016-06-11 ENCOUNTER — Telehealth (HOSPITAL_COMMUNITY): Payer: Self-pay | Admitting: *Deleted

## 2016-06-11 NOTE — Telephone Encounter (Signed)
Left message on voicemail in reference to upcoming appointment scheduled for 06/15/16. Phone number given for a call back so details instructions can be given. Daneil Dolin

## 2016-06-15 ENCOUNTER — Other Ambulatory Visit (HOSPITAL_COMMUNITY): Payer: BLUE CROSS/BLUE SHIELD

## 2016-07-05 ENCOUNTER — Telehealth (HOSPITAL_COMMUNITY): Payer: Self-pay | Admitting: *Deleted

## 2016-07-05 NOTE — Telephone Encounter (Signed)
Left message on voicemail in reference to upcoming appointment scheduled for 07/07/16. Phone number given for a call back so details instructions can be given. Craig Prince   

## 2016-07-06 ENCOUNTER — Other Ambulatory Visit: Payer: Self-pay | Admitting: Cardiology

## 2016-07-06 NOTE — Telephone Encounter (Signed)
REFILL 

## 2016-07-07 ENCOUNTER — Other Ambulatory Visit (HOSPITAL_COMMUNITY): Payer: BLUE CROSS/BLUE SHIELD

## 2016-07-07 DIAGNOSIS — R0989 Other specified symptoms and signs involving the circulatory and respiratory systems: Secondary | ICD-10-CM

## 2016-11-18 ENCOUNTER — Other Ambulatory Visit: Payer: Self-pay | Admitting: Cardiology

## 2016-11-21 ENCOUNTER — Emergency Department (HOSPITAL_BASED_OUTPATIENT_CLINIC_OR_DEPARTMENT_OTHER): Payer: BLUE CROSS/BLUE SHIELD

## 2016-11-21 ENCOUNTER — Encounter (HOSPITAL_BASED_OUTPATIENT_CLINIC_OR_DEPARTMENT_OTHER): Payer: Self-pay | Admitting: *Deleted

## 2016-11-21 ENCOUNTER — Emergency Department (HOSPITAL_BASED_OUTPATIENT_CLINIC_OR_DEPARTMENT_OTHER)
Admission: EM | Admit: 2016-11-21 | Discharge: 2016-11-21 | Disposition: A | Discharge status: Home / Self Care | Payer: BLUE CROSS/BLUE SHIELD | Attending: Emergency Medicine | Admitting: Emergency Medicine

## 2016-11-21 DIAGNOSIS — N189 Chronic kidney disease, unspecified: Secondary | ICD-10-CM | POA: Diagnosis not present

## 2016-11-21 DIAGNOSIS — I2510 Atherosclerotic heart disease of native coronary artery without angina pectoris: Secondary | ICD-10-CM | POA: Diagnosis not present

## 2016-11-21 DIAGNOSIS — R05 Cough: Secondary | ICD-10-CM | POA: Diagnosis present

## 2016-11-21 DIAGNOSIS — Z79899 Other long term (current) drug therapy: Secondary | ICD-10-CM | POA: Diagnosis not present

## 2016-11-21 DIAGNOSIS — I5022 Chronic systolic (congestive) heart failure: Secondary | ICD-10-CM | POA: Insufficient documentation

## 2016-11-21 DIAGNOSIS — I130 Hypertensive heart and chronic kidney disease with heart failure and stage 1 through stage 4 chronic kidney disease, or unspecified chronic kidney disease: Secondary | ICD-10-CM | POA: Insufficient documentation

## 2016-11-21 DIAGNOSIS — R059 Cough, unspecified: Secondary | ICD-10-CM

## 2016-11-21 MED ORDER — PROMETHAZINE-DM 6.25-15 MG/5ML PO SYRP: 5.0000 mL | ORAL_SOLUTION | Freq: Four times a day (QID) | ORAL | 0 refills | Status: DC | PRN

## 2016-11-21 NOTE — ED Provider Notes (Signed)
[facility] Provider Note   CSN: [id number] Arrival date & time: 11/21/16  0604     History   Chief Complaint Chief Complaint  Patient presents with  . Cough    HPI Craig Prince is a 60 y.o. male.  HPI  This is a 60 year old male with a history of coronary artery disease, chronic kidney disease, hypertension, hyperlipidemia who presents with cough. Patient reports 3 week history of cough. He reports that it is productive. He denies any chills or fevers. No history of smoking or asthma. Denies any congestion, sore throat, nausea, vomiting, diarrhea, shortness of breath, chest pain. Denies history of reflux or seasonal allergies.  Past Medical History:  Diagnosis Date  . CAD (coronary artery disease)    NONOBSTRUCTIVE  a. cath 8/10: prox to mid LAD 20%  . Chronic systolic heart failure (HCC)   . CKD (chronic kidney disease)    baseline creatinine approx 1.5  . Essential hypertension, benign   . Gout   . Hyperlipidemia   . Hypertrophic nonobstructive cardiomyopathy (HCC)    septal hypertrophy without LVOT  . NICM (nonischemic cardiomyopathy) (HCC)    a. echo 8/10: EF20-25%, mod LAE, severe asymmetric septal hypertrophy (? nonobs. HCM); grade 1 diast dyfxn.  EF 55% echo June 6    Patient Active Problem List   Diagnosis Date Noted  . Left inguinal hernia 01/08/2014  . Insomnia 05/19/2012  . Hypertrophic nonobstructive cardiomyopathy (HCC) 05/04/2011  . Chronic systolic heart failure (HCC)   . Non Obstructive CAD by cath 06/2009   . OBESITY, UNSPECIFIED 10/14/2010  . Non Ischemic Cardiomyopathy 08/20/2009  . HYPERTENSION, BENIGN SYSTEMIC 01/26/2007  . GASTROINTESTINAL HEMORRHAGE 01/26/2007    Past Surgical History:  Procedure Laterality Date  . KNEE ARTHROSCOPY     right       Home Medications    Prior to Admission medications   Medication Sig Start Date End Date Taking? Authorizing Provider  amLODipine (NORVASC) 10 MG tablet Take 1 tablet (10  mg total) by mouth daily. 05/04/16   Rollene Rotunda, MD  aspirin 81 MG tablet Take 81 mg by mouth daily.      Historical Provider, MD  B Complex Vitamins (VITAMIN B COMPLEX PO) Take 1 tablet by mouth daily.      Historical Provider, MD  carvedilol (COREG) 25 MG tablet TAKE 2 TABLETS BY MOUTH TWICE DAILY WITH MEALS 05/04/16   Rollene Rotunda, MD  Cholecalciferol (VITAMIN D-3 PO) Take 1 tablet by mouth daily.    Historical Provider, MD  CINNAMON PO Take 1 tablet by mouth daily.      Historical Provider, MD  colchicine 0.6 MG tablet Take 2 tablets now, repeat 1 tablet 1 hour later. 01/07/16   Charm Rings, MD  Cyanocobalamin (VITAMIN B-12 PO) Take 1 tablet by mouth daily.      Historical Provider, MD  enalapril (VASOTEC) 20 MG tablet TAKE 1 TABLET BY MOUTH TWICE DAILY 11/18/16   Rollene Rotunda, MD  furosemide (LASIX) 40 MG tablet Take 1 tablet (40 mg total) by mouth daily. 05/04/16   Rollene Rotunda, MD  Garlic TABS Take 1 tablet by mouth daily.      Historical Provider, MD  meclizine (ANTIVERT) 25 MG tablet Take 1 tablet (25 mg total) by mouth 3 (three) times daily as needed for dizziness. 03/02/16   Pricilla Loveless, MD  naproxen (NAPROSYN) 500 MG tablet Take 1 tablet (500 mg total) by mouth 2 (two) times daily. 01/07/16   Denny Peon  Chip BoerJ Honig, MD  niacin 100 MG tablet Take 100 mg by mouth at bedtime.    Historical Provider, MD  traMADol (ULTRAM) 50 MG tablet Take 1 tablet (50 mg total) by mouth every 6 (six) hours as needed for severe pain. 01/07/16   Charm RingsErin J Honig, MD  zolpidem (AMBIEN) 10 MG tablet Take 1 tablet (10 mg total) by mouth at bedtime as needed for sleep. 10/02/14 05/23/16  Rollene RotundaJames Hochrein, MD    Family History Family History  Problem Relation Age of Onset  . Kidney disease Brother   . Cancer Brother     prostate  . Kidney disease Father   . Diabetes Mother   . Heart attack Mother   . Heart disease Mother   . Cancer Sister     breast    Social History Social History  Substance Use Topics  . Smoking  status: Never Smoker  . Smokeless tobacco: Never Used  . Alcohol use No     Allergies   Patient has no known allergies.   Review of Systems Review of Systems  Constitutional: Negative for chills and fever.  HENT: Negative for congestion and sore throat.   Respiratory: Positive for cough. Negative for shortness of breath and wheezing.   Cardiovascular: Negative for chest pain and leg swelling.  Gastrointestinal: Negative for abdominal pain, diarrhea, nausea and vomiting.  All other systems reviewed and are negative.    Physical Exam Updated Vital Signs BP 117/75 (BP Location: Right Arm)   Pulse 74   Temp 98 F (36.7 C) (Oral)   Resp 24   Ht 6' (1.829 m)   Wt 230 lb (104.3 kg)   SpO2 99%   BMI 31.19 kg/m   Physical Exam  Constitutional: He is oriented to person, place, and time. He appears well-developed and well-nourished. No distress.  HENT:  Head: Normocephalic and atraumatic.  Cardiovascular: Normal rate, regular rhythm and normal heart sounds.   No murmur heard. Pulmonary/Chest: Effort normal and breath sounds normal. No respiratory distress. He has no wheezes.  Abdominal: Soft. There is no tenderness.  Musculoskeletal: He exhibits no edema.  Trace bilateral lower extremity edema  Neurological: He is alert and oriented to person, place, and time.  Skin: Skin is warm and dry.  Psychiatric: He has a normal mood and affect.  Nursing note and vitals reviewed.    ED Treatments / Results  Labs (all labs ordered are listed, but only abnormal results are displayed) Labs Reviewed - No data to display  EKG  EKG Interpretation None       Radiology No results found.  Procedures Procedures (including critical care time)  Medications Ordered in ED Medications - No data to display   Initial Impression / Assessment and Plan / ED Course  I have reviewed the triage vital signs and the nursing notes.  Pertinent labs & imaging results that were available  during my care of the patient were reviewed by me and considered in my medical decision making (see chart for details).  Clinical Course     She presents with cough. Cough for the last 3 weeks. No other significant infectious symptoms. Nontoxic. Breath sounds clear. Vital signs reassuring. Chest x-ray pending.  Final Clinical Impressions(s) / ED Diagnoses   Final diagnoses:  Cough    New Prescriptions New Prescriptions   No medications on file     Shon Batonourtney F Hazelynn Mckenny, MD 11/22/16 [phone]

## 2016-11-21 NOTE — Discharge Instructions (Addendum)
Let your doctor know that they need to review your chest X-Ray results to determine if you ned a follow up chest CT scan. Call on the next business day to schedule an appointment.

## 2016-11-21 NOTE — ED Triage Notes (Signed)
C/o cold sx for 3 weeks, sx improving, cough continuous and lasting, semi-productive phlegm (clear/white), also reports "a little": sob and dizziness "sometimes", concerned about PNA, (denies: pain, fever, nvd), alert, NAD, calm, resps e/u, no dyspnea noted.

## 2016-11-21 NOTE — ED Triage Notes (Signed)
Back from xray

## 2016-12-28 ENCOUNTER — Encounter (HOSPITAL_BASED_OUTPATIENT_CLINIC_OR_DEPARTMENT_OTHER): Payer: Self-pay | Admitting: *Deleted

## 2016-12-28 ENCOUNTER — Observation Stay (HOSPITAL_BASED_OUTPATIENT_CLINIC_OR_DEPARTMENT_OTHER)
Admission: EM | Admit: 2016-12-28 | Discharge: 2016-12-29 | Disposition: A | Discharge status: Home / Self Care | Payer: BLUE CROSS/BLUE SHIELD | Attending: Cardiology | Admitting: Cardiology

## 2016-12-28 ENCOUNTER — Emergency Department (HOSPITAL_BASED_OUTPATIENT_CLINIC_OR_DEPARTMENT_OTHER): Payer: BLUE CROSS/BLUE SHIELD

## 2016-12-28 DIAGNOSIS — E785 Hyperlipidemia, unspecified: Secondary | ICD-10-CM | POA: Diagnosis not present

## 2016-12-28 DIAGNOSIS — I447 Left bundle-branch block, unspecified: Secondary | ICD-10-CM | POA: Diagnosis not present

## 2016-12-28 DIAGNOSIS — M109 Gout, unspecified: Secondary | ICD-10-CM | POA: Diagnosis not present

## 2016-12-28 DIAGNOSIS — N182 Chronic kidney disease, stage 2 (mild): Secondary | ICD-10-CM | POA: Insufficient documentation

## 2016-12-28 DIAGNOSIS — Z7982 Long term (current) use of aspirin: Secondary | ICD-10-CM | POA: Diagnosis not present

## 2016-12-28 DIAGNOSIS — I422 Other hypertrophic cardiomyopathy: Secondary | ICD-10-CM | POA: Diagnosis not present

## 2016-12-28 DIAGNOSIS — I428 Other cardiomyopathies: Secondary | ICD-10-CM

## 2016-12-28 DIAGNOSIS — R0789 Other chest pain: Secondary | ICD-10-CM | POA: Diagnosis not present

## 2016-12-28 DIAGNOSIS — I2510 Atherosclerotic heart disease of native coronary artery without angina pectoris: Secondary | ICD-10-CM | POA: Insufficient documentation

## 2016-12-28 DIAGNOSIS — R079 Chest pain, unspecified: Secondary | ICD-10-CM | POA: Diagnosis present

## 2016-12-28 DIAGNOSIS — Z8249 Family history of ischemic heart disease and other diseases of the circulatory system: Secondary | ICD-10-CM | POA: Diagnosis not present

## 2016-12-28 DIAGNOSIS — R748 Abnormal levels of other serum enzymes: Secondary | ICD-10-CM | POA: Diagnosis not present

## 2016-12-28 DIAGNOSIS — Z803 Family history of malignant neoplasm of breast: Secondary | ICD-10-CM | POA: Diagnosis not present

## 2016-12-28 DIAGNOSIS — I10 Essential (primary) hypertension: Secondary | ICD-10-CM | POA: Diagnosis present

## 2016-12-28 DIAGNOSIS — E669 Obesity, unspecified: Secondary | ICD-10-CM | POA: Diagnosis not present

## 2016-12-28 DIAGNOSIS — Z6831 Body mass index (BMI) 31.0-31.9, adult: Secondary | ICD-10-CM | POA: Insufficient documentation

## 2016-12-28 DIAGNOSIS — G4700 Insomnia, unspecified: Secondary | ICD-10-CM | POA: Diagnosis not present

## 2016-12-28 DIAGNOSIS — I214 Non-ST elevation (NSTEMI) myocardial infarction: Secondary | ICD-10-CM | POA: Diagnosis present

## 2016-12-28 DIAGNOSIS — Z8042 Family history of malignant neoplasm of prostate: Secondary | ICD-10-CM | POA: Insufficient documentation

## 2016-12-28 DIAGNOSIS — I130 Hypertensive heart and chronic kidney disease with heart failure and stage 1 through stage 4 chronic kidney disease, or unspecified chronic kidney disease: Secondary | ICD-10-CM | POA: Insufficient documentation

## 2016-12-28 DIAGNOSIS — Z23 Encounter for immunization: Secondary | ICD-10-CM | POA: Insufficient documentation

## 2016-12-28 DIAGNOSIS — R778 Other specified abnormalities of plasma proteins: Secondary | ICD-10-CM | POA: Diagnosis present

## 2016-12-28 DIAGNOSIS — Z833 Family history of diabetes mellitus: Secondary | ICD-10-CM | POA: Diagnosis not present

## 2016-12-28 DIAGNOSIS — I5022 Chronic systolic (congestive) heart failure: Secondary | ICD-10-CM | POA: Insufficient documentation

## 2016-12-28 DIAGNOSIS — R7989 Other specified abnormal findings of blood chemistry: Secondary | ICD-10-CM

## 2016-12-28 LAB — BASIC METABOLIC PANEL
Anion gap: 8 (ref 5–15)
BUN: 17 mg/dL (ref 6–20)
CO2: 25 mmol/L (ref 22–32)
Calcium: 9.1 mg/dL (ref 8.9–10.3)
Chloride: 102 mmol/L (ref 101–111)
Creatinine, Ser: 1.08 mg/dL (ref 0.61–1.24)
GFR calc Af Amer: >60 mL/min (ref >60)
GFR calc non Af Amer: >60 mL/min (ref >60)
Glucose, Bld: 119 mg/dL — ABNORMAL HIGH (ref 65–99)
Potassium: 3.6 mmol/L (ref 3.5–5.1)
Sodium: 135 mmol/L (ref 135–145)

## 2016-12-28 LAB — CBC
HCT: 40.1 % (ref 39.0–52.0)
Hemoglobin: 13.4 g/dL (ref 13.0–17.0)
MCH: 22.7 pg — ABNORMAL LOW (ref 26.0–34.0)
MCHC: 33.4 g/dL (ref 30.0–36.0)
MCV: 67.9 fL — ABNORMAL LOW (ref 78.0–100.0)
Platelets: 170 10*3/uL (ref 150–400)
RBC: 5.91 MIL/uL — ABNORMAL HIGH (ref 4.22–5.81)
RDW: 18.2 % — ABNORMAL HIGH (ref 11.5–15.5)
WBC: 6 10*3/uL (ref 4.0–10.5)

## 2016-12-28 LAB — TROPONIN I
Troponin I: 0.05 ng/mL (ref <0.03)
Troponin I: 0.04 ng/mL (ref <0.03)
Troponin I: 0.05 ng/mL (ref <0.03)
Troponin I: 0.05 ng/mL (ref <0.03)

## 2016-12-28 LAB — HEPARIN LEVEL (UNFRACTIONATED): Heparin Unfractionated: 0.44 IU/mL (ref 0.30–0.70)

## 2016-12-28 LAB — MRSA PCR SCREENING: MRSA by PCR: NEGATIVE

## 2016-12-28 MED ORDER — AMLODIPINE BESYLATE 10 MG PO TABS
10.0000 mg | ORAL_TABLET | Freq: Every day | ORAL | Status: DC
  Administered 2016-12-29: 10 mg via ORAL
  Filled 2016-12-28: qty 1

## 2016-12-28 MED ORDER — HEPARIN BOLUS VIA INFUSION
4000.0000 [IU] | Freq: Once | INTRAVENOUS | Status: AC
  Administered 2016-12-28: 4000 [IU] via INTRAVENOUS

## 2016-12-28 MED ORDER — SODIUM CHLORIDE 0.9 % IV BOLUS (SEPSIS)
1000.0000 mL | Freq: Once | INTRAVENOUS | Status: AC
  Administered 2016-12-28: 1000 mL via INTRAVENOUS

## 2016-12-28 MED ORDER — GI COCKTAIL ~~LOC~~
30.0000 mL | Freq: Once | ORAL | Status: AC
  Administered 2016-12-28: 30 mL via ORAL
  Filled 2016-12-28: qty 30

## 2016-12-28 MED ORDER — HEPARIN (PORCINE) IN NACL 100-0.45 UNIT/ML-% IJ SOLN
1350.0000 [IU]/h | INTRAMUSCULAR | Status: DC
  Administered 2016-12-28 – 2016-12-29 (×2): 1350 [IU]/h via INTRAVENOUS
  Filled 2016-12-28 (×2): qty 250

## 2016-12-28 MED ORDER — FUROSEMIDE 40 MG PO TABS
40.0000 mg | ORAL_TABLET | Freq: Every day | ORAL | Status: DC
  Administered 2016-12-29: 40 mg via ORAL
  Filled 2016-12-28: qty 1

## 2016-12-28 MED ORDER — NITROGLYCERIN 0.4 MG SL SUBL
0.4000 mg | SUBLINGUAL_TABLET | SUBLINGUAL | Status: AC | PRN
  Administered 2016-12-28 (×3): 0.4 mg via SUBLINGUAL
  Filled 2016-12-28: qty 1

## 2016-12-28 MED ORDER — INFLUENZA VAC SPLIT QUAD 0.5 ML IM SUSY
0.5000 mL | PREFILLED_SYRINGE | INTRAMUSCULAR | Status: AC
  Administered 2016-12-29: 0.5 mL via INTRAMUSCULAR
  Filled 2016-12-28: qty 0.5

## 2016-12-28 MED ORDER — ASPIRIN 81 MG PO CHEW
324.0000 mg | CHEWABLE_TABLET | Freq: Once | ORAL | Status: AC
  Administered 2016-12-28: 324 mg via ORAL
  Filled 2016-12-28: qty 4

## 2016-12-28 MED ORDER — NIACIN 100 MG PO TABS
100.0000 mg | ORAL_TABLET | Freq: Every day | ORAL | Status: DC
  Administered 2016-12-28: 100 mg via ORAL
  Filled 2016-12-28 (×2): qty 1

## 2016-12-28 MED ORDER — ZOLPIDEM TARTRATE 5 MG PO TABS
10.0000 mg | ORAL_TABLET | Freq: Every evening | ORAL | Status: DC | PRN
  Administered 2016-12-28: 10 mg via ORAL
  Filled 2016-12-28: qty 2

## 2016-12-28 MED ORDER — ONDANSETRON HCL 4 MG/2ML IJ SOLN
4.0000 mg | Freq: Four times a day (QID) | INTRAMUSCULAR | Status: DC | PRN
Start: 2016-12-28 — End: 2016-12-29

## 2016-12-28 MED ORDER — CARVEDILOL 25 MG PO TABS: 50.0000 mg | ORAL_TABLET | Freq: Two times a day (BID) | ORAL | Status: DC

## 2016-12-28 MED ORDER — FENTANYL CITRATE (PF) 100 MCG/2ML IJ SOLN
50.0000 ug | Freq: Once | INTRAMUSCULAR | Status: AC
  Administered 2016-12-28: 50 ug via INTRAVENOUS
  Filled 2016-12-28: qty 2

## 2016-12-28 MED ORDER — ASPIRIN EC 81 MG PO TBEC
81.0000 mg | DELAYED_RELEASE_TABLET | Freq: Every day | ORAL | Status: DC
  Administered 2016-12-29: 81 mg via ORAL
  Filled 2016-12-28: qty 1

## 2016-12-28 MED ORDER — ACETAMINOPHEN 325 MG PO TABS
650.0000 mg | ORAL_TABLET | ORAL | Status: DC | PRN
  Administered 2016-12-29: 650 mg via ORAL
  Filled 2016-12-28: qty 2

## 2016-12-28 MED ORDER — CARVEDILOL 25 MG PO TABS
50.0000 mg | ORAL_TABLET | Freq: Two times a day (BID) | ORAL | Status: DC
  Administered 2016-12-28 – 2016-12-29 (×2): 50 mg via ORAL
  Filled 2016-12-28 (×2): qty 2

## 2016-12-28 MED ORDER — ENALAPRIL MALEATE 20 MG PO TABS
20.0000 mg | ORAL_TABLET | Freq: Two times a day (BID) | ORAL | Status: DC
  Administered 2016-12-28 – 2016-12-29 (×2): 20 mg via ORAL
  Filled 2016-12-28 (×3): qty 1

## 2016-12-28 NOTE — H&P (Signed)
CARDIOLOGY ADMISSION NOTE  Patient ID: Craig Prince MRN: [id number] DOB/AGE: 1956-11-04 61 y.o.  Admit date: 12/28/2016 Primary Physician   Georgianne Fick, MD Primary Cardiologist   Dr. Antoine Poche Chief Complaint    Chest Pain  HPI:  The patient presented evaluation of chest pain.  He has had a nonischemic cardiomyopathy with an original ejection fraction of 25% which improved to 50% on followup in 2012 and 14.  He does have some septal hypertrophy. I sent him for an exercise treadmill test in Feb 2016. It was reported that he dropped his blood pressure. He was able to go for about 9 minutes and 30 seconds without symptoms however. I was going to get a dobutamine echocardiogram to look for high risk findings and inducible gradient. However, he canceled this appointment.  When I saw him in May of last year at his last office appt he agreed to have this study done but it was not scheduled.    He presented after developing chest pain last night.  He reports that this has been a constant 7  - 5/10 pain under the left breast to axilla.  It is not associated with N/V, diaphoresis or SOB.  There has been no radiation.  In the ED his troponin was mildly elevated at 0.05.  It is not made worse by positions or movement.  He has not had this before.  There has been mild improvement and he has been treated in the ED with heparin, NTG SL x 3, ASA, fentanyl, and GI cocktail.  EKG demonstrated no change from previous.  The pain is somewhat sharp.    Past Medical History:  Diagnosis Date  . CAD (coronary artery disease)    NONOBSTRUCTIVE  a. cath 8/10: prox to mid LAD 20%  . Chronic systolic heart failure (HCC)   . CKD (chronic kidney disease)    baseline creatinine approx 1.5  . Essential hypertension, benign   . Gout   . Hyperlipidemia   . Hypertrophic nonobstructive cardiomyopathy (HCC)    septal hypertrophy without LVOT  . NICM (nonischemic cardiomyopathy) (HCC)    a. echo 8/10: EF20-25%, mod  LAE, severe asymmetric septal hypertrophy (? nonobs. HCM); grade 1 diast dyfxn.  EF 55% echo June 6    Past Surgical History:  Procedure Laterality Date  . KNEE ARTHROSCOPY     right    No Known Allergies No current facility-administered medications on file prior to encounter.    Current Outpatient Prescriptions on File Prior to Encounter  Medication Sig Dispense Refill  . amLODipine (NORVASC) 10 MG tablet Take 1 tablet (10 mg total) by mouth daily. 90 tablet 3  . aspirin 81 MG tablet Take 81 mg by mouth daily.      . B Complex Vitamins (VITAMIN B COMPLEX PO) Take 1 tablet by mouth daily.      . carvedilol (COREG) 25 MG tablet TAKE 2 TABLETS BY MOUTH TWICE DAILY WITH MEALS 360 tablet 2  . Cholecalciferol (VITAMIN D-3 PO) Take 1 tablet by mouth daily.    Marland Kitchen CINNAMON PO Take 1 tablet by mouth daily.      . colchicine 0.6 MG tablet Take 2 tablets now, repeat 1 tablet 1 hour later. 3 tablet 0  . Cyanocobalamin (VITAMIN B-12 PO) Take 1 tablet by mouth daily.      . enalapril (VASOTEC) 20 MG tablet TAKE 1 TABLET BY MOUTH TWICE DAILY 180 tablet 0  . furosemide (LASIX) 40 MG tablet Take 1 tablet (40  mg total) by mouth daily. 90 tablet 3  . Garlic TABS Take 1 tablet by mouth daily.      . meclizine (ANTIVERT) 25 MG tablet Take 1 tablet (25 mg total) by mouth 3 (three) times daily as needed for dizziness. 15 tablet 0  . naproxen (NAPROSYN) 500 MG tablet Take 1 tablet (500 mg total) by mouth 2 (two) times daily. 30 tablet 0  . niacin 100 MG tablet Take 100 mg by mouth at bedtime.    . promethazine-dextromethorphan (PROMETHAZINE-DM) 6.25-15 MG/5ML syrup Take 5 mLs by mouth 4 (four) times daily as needed for cough. 118 mL 0  . traMADol (ULTRAM) 50 MG tablet Take 1 tablet (50 mg total) by mouth every 6 (six) hours as needed for severe pain. 10 tablet 0  . zolpidem (AMBIEN) 10 MG tablet Take 1 tablet (10 mg total) by mouth at bedtime as needed for sleep. 30 tablet 3   Social History   Social History    . Marital status: Legally Separated    Spouse name: N/A  . Number of children: N/A  . Years of education: N/A   Occupational History  . Not on file.   Social History Main Topics  . Smoking status: Never Smoker  . Smokeless tobacco: Never Used  . Alcohol use No  . Drug use: No  . Sexual activity: Not on file   Other Topics Concern  . Not on file   Social History Narrative  . No narrative on file    Family History  Problem Relation Age of Onset  . Kidney disease Brother   . Cancer Brother     prostate  . Kidney disease Father   . Diabetes Mother   . Heart attack Mother   . Heart disease Mother   . Cancer Sister     breast     ROS:  Light headedness.  Otherwise as stated in the HPI and negative for all other systems.  Physical Exam: Blood pressure 154/100, pulse 70, temperature 97.7 F (36.5 C), temperature source Oral, resp. rate 16, height 6' (1.829 m), weight 230 lb (104.3 kg), SpO2 99 %.  GENERAL:  Well appearing HEENT:  Pupils equal round and reactive, fundi not visualized, oral mucosa unremarkable NECK:  No jugular venous distention, waveform within normal limits, carotid upstroke brisk and symmetric, no bruits, no thyromegaly LYMPHATICS:  No cervical, inguinal adenopathy LUNGS:  Clear to auscultation bilaterally BACK:  No CVA tenderness CHEST:  Unremarkable HEART:  PMI not displaced or sustained,S1 and S2 within normal limits, no S3, no S4, no clicks, no rubs,  murmurs ABD:  Flat, positive bowel sounds normal in frequency in pitch, no bruits, no rebound, no guarding, no midline pulsatile mass, no hepatomegaly, no splenomegaly EXT:  2 plus pulses throughout, no edema, no cyanosis no clubbing SKIN:  No rashes no nodules NEURO:  Cranial nerves II through XII grossly intact, motor grossly intact throughout PSYCH:  Cognitively intact, oriented to person place and time  Labs: Lab Results  Component Value Date   BUN 17 12/28/2016   Lab Results  Component  Value Date   CREATININE 1.08 12/28/2016   Lab Results  Component Value Date   NA 135 12/28/2016   K 3.6 12/28/2016   CL 102 12/28/2016   CO2 25 12/28/2016   Lab Results  Component Value Date   TROPONINI 0.04 (HH) 12/28/2016   Lab Results  Component Value Date   WBC 6.0 12/28/2016   HGB 13.4 12/28/2016  HCT 40.1 12/28/2016   MCV 67.9 (L) 12/28/2016   PLT 170 12/28/2016    Radiology:  CXR:  There is mild cardiomegaly. Lungs clear. No effusions. No acute bony abnormality.  EKG:  NSR, rate 64, axis WNL, QRS widening with LVH and repolarization changes.  No change from previous.  Call Mr. Redder with the results and send results to [facility], MD   ASSESSMENT AND PLAN:    CHEST PAIN:  Very atypical. However, given the previous abnormal ETT  I will try to order a CT coronary angiogram in the AM.  Continue IV heparin.    LVH:  Septal hypertrophy.  Follow this clinically.    CKD (Stage II):  Creat is actually better than before.  HTN:  BP is elevated currently.  Continue current therapy.  I will titrate meds as needed.     SignedRollene Rotunda 12/28/2016, 5:31 PM

## 2016-12-28 NOTE — Progress Notes (Signed)
ANTICOAGULATION CONSULT NOTE   Pharmacy Consult for Heparin Indication: chest pain/ACS  No Known Allergies  Patient Measurements: Height: 6' (182.9 cm) Weight: 229 lb 15 oz (104.3 kg) IBW/kg (Calculated) : 77.6 Heparin Dosing Weight: 99 kg  Vital Signs: Temp: 98.2 F (36.8 C) (01/30 2000) Temp Source: Oral (01/30 2000) BP: 155/110 (01/30 2000) Pulse Rate: 72 (01/30 2000)  Labs:  Recent Labs  12/28/16 1119 12/28/16 1440 12/28/16 1933  HGB 13.4  --   --   HCT 40.1  --   --   PLT 170  --   --   HEPARINUNFRC  --   --  0.44  CREATININE 1.08  --   --   TROPONINI 0.05* 0.04*  --     Estimated Creatinine Clearance: 89.7 mL/min (by C-G formula based on SCr of 1.08 mg/dL).   Medical History: Past Medical History:  Diagnosis Date  . CAD (coronary artery disease)    NONOBSTRUCTIVE  a. cath 8/10: prox to mid LAD 20%  . Chronic systolic heart failure (HCC)   . CKD (chronic kidney disease)    baseline creatinine approx 1.5  . Essential hypertension, benign   . Gout   . Hyperlipidemia   . Hypertrophic nonobstructive cardiomyopathy (HCC)    septal hypertrophy without LVOT  . NICM (nonischemic cardiomyopathy) (HCC)    a. echo 8/10: EF20-25%, mod LAE, severe asymmetric septal hypertrophy (? nonobs. HCM); grade 1 diast dyfxn.  EF 55% echo June 6    Medications:  Prescriptions Prior to Admission  Medication Sig Dispense Refill Last Dose  . amLODipine (NORVASC) 10 MG tablet Take 1 tablet (10 mg total) by mouth daily. 90 tablet 3 12/28/2016 at Unknown time  . aspirin 81 MG tablet Take 81 mg by mouth daily.     12/28/2016 at Unknown time  . B Complex Vitamins (VITAMIN B COMPLEX PO) Take 1 tablet by mouth daily.     12/28/2016 at Unknown time  . carvedilol (COREG) 25 MG tablet TAKE 2 TABLETS BY MOUTH TWICE DAILY WITH MEALS 360 tablet 2 12/28/2016 at 0800  . Cholecalciferol (VITAMIN D-3 PO) Take 1 tablet by mouth daily.   12/27/2016 at Unknown time  . CINNAMON PO Take 1 tablet by  mouth daily.     Past Month at Unknown time  . colchicine 0.6 MG tablet Take 2 tablets now, repeat 1 tablet 1 hour later. 3 tablet 0 Past Month at Unknown time  . Cyanocobalamin (VITAMIN B-12 PO) Take 1 tablet by mouth daily.     12/28/2016 at Unknown time  . enalapril (VASOTEC) 20 MG tablet TAKE 1 TABLET BY MOUTH TWICE DAILY 180 tablet 0 12/28/2016 at Unknown time  . furosemide (LASIX) 40 MG tablet Take 1 tablet (40 mg total) by mouth daily. 90 tablet 3 12/28/2016 at Unknown time  . Garlic TABS Take 1 tablet by mouth daily.     12/27/2016 at Unknown time  . meclizine (ANTIVERT) 25 MG tablet Take 1 tablet (25 mg total) by mouth 3 (three) times daily as needed for dizziness. 15 tablet 0 Past Month at Unknown time  . naproxen (NAPROSYN) 500 MG tablet Take 1 tablet (500 mg total) by mouth 2 (two) times daily. 30 tablet 0 12/27/2016 at Unknown time  . niacin 100 MG tablet Take 100 mg by mouth at bedtime.   Past Month at Unknown time  . promethazine-dextromethorphan (PROMETHAZINE-DM) 6.25-15 MG/5ML syrup Take 5 mLs by mouth 4 (four) times daily as needed for cough. 118 mL  0 Past Month at Unknown time  . traMADol (ULTRAM) 50 MG tablet Take 1 tablet (50 mg total) by mouth every 6 (six) hours as needed for severe pain. 10 tablet 0 Past Month at Unknown time  . zolpidem (AMBIEN) 10 MG tablet Take 1 tablet (10 mg total) by mouth at bedtime as needed for sleep. 30 tablet 3 Taking   Scheduled:  . amLODipine  10 mg Oral Daily  . aspirin EC  81 mg Oral Daily  . carvedilol  50 mg Oral BID WC  . enalapril  20 mg Oral BID  . furosemide  40 mg Oral Daily  . [START ON 12/29/2016] Influenza vac split quadrivalent PF  0.5 mL Intramuscular Tomorrow-1000  . niacin  100 mg Oral QHS   Infusions:  . heparin 1,350 Units/hr (12/28/16 1246)    Assessment: 61yo male with history of CAD, CHF, CKD, HTN and HLD presents to [facility] with CP.  Initial heparin level is therapeutic at 0.44.   Goal of Therapy:  Heparin level 0.3-0.7  units/ml Monitor platelets by anticoagulation protocol: Yes   Plan:  1. Continue heparin infusion at 1350 units/hr 2. Confirmatory heparin level with am labs  3. Daily heparin level and CBC  Pollyann Samples, PharmD, BCPS 12/28/2016, 8:35 PM

## 2016-12-28 NOTE — ED Notes (Signed)
Report called to Angelica Rn on the Floor

## 2016-12-28 NOTE — ED Triage Notes (Signed)
Aching in his left chest since last night. Comes and goes. 2 years ago he had a heart cath due to HTN, that was negative.

## 2016-12-28 NOTE — Progress Notes (Signed)
Critical troponin 0.05 called to in fellow night coverage.

## 2016-12-28 NOTE — Progress Notes (Signed)
ANTICOAGULATION CONSULT NOTE - Initial Consult  Pharmacy Consult for Heparin Indication: chest pain/ACS  No Known Allergies  Patient Measurements: Height: 6' (182.9 cm) Weight: 230 lb (104.3 kg) IBW/kg (Calculated) : 77.6 Heparin Dosing Weight: 99 kg  Vital Signs: Temp: 97.7 F (36.5 C) (01/30 1108) Temp Source: Oral (01/30 1108) BP: 124/89 (01/30 1230) Pulse Rate: 70 (01/30 1230)  Labs:  Recent Labs  12/28/16 1119  HGB 13.4  HCT 40.1  PLT 170  CREATININE 1.08  TROPONINI 0.05*    Estimated Creatinine Clearance: 89.7 mL/min (by C-G formula based on SCr of 1.08 mg/dL).   Medical History: Past Medical History:  Diagnosis Date  . CAD (coronary artery disease)    NONOBSTRUCTIVE  a. cath 8/10: prox to mid LAD 20%  . Chronic systolic heart failure (HCC)   . CKD (chronic kidney disease)    baseline creatinine approx 1.5  . Essential hypertension, benign   . Gout   . Hyperlipidemia   . Hypertrophic nonobstructive cardiomyopathy (HCC)    septal hypertrophy without LVOT  . NICM (nonischemic cardiomyopathy) (HCC)    a. echo 8/10: EF20-25%, mod LAE, severe asymmetric septal hypertrophy (? nonobs. HCM); grade 1 diast dyfxn.  EF 55% echo June 6    Medications:   (Not in a hospital admission) Scheduled:  . fentaNYL (SUBLIMAZE) injection  50 mcg Intravenous Once   Infusions:  . sodium chloride 1,000 mL (12/28/16 1200)    Assessment: 61yo male with history of CAD, CHF, CKD, HTN and HLD presents to [facility] with CP. Pharmacy is consulted to dose heparin for ACS/chest pain.  Goal of Therapy:  Heparin level 0.3-0.7 units/ml Monitor platelets by anticoagulation protocol: Yes   Plan:  Give 4000 units bolus x 1 Start heparin infusion at 1350 units/hr Check anti-Xa level in 6 hours and daily while on heparin Continue to monitor H&H and platelets  Arlean Hopping. Newman Pies, PharmD, BCPS Clinical Pharmacist [phone] Delle Reining M 12/28/2016,12:33 PM

## 2016-12-28 NOTE — ED Notes (Signed)
ED Provider at bedside. 

## 2016-12-28 NOTE — ED Provider Notes (Signed)
[facility] Provider Note   CSN: [id number] Arrival date & time: 12/28/16  1101     History   Chief Complaint Chief Complaint  Patient presents with  . Chest Pain    HPI Craig Prince is a 61 y.o. male.  61 yo M with a chief complaints of chest pain. This is described as an ache is left-sided. It is worse with eating. Nothing seems to make it better. Patient denies cough congestion fevers denies lower extremity edema denies history of PE or DVT. Patient has never had an MI in the past. He has had a cardiac cath with 20% disease in the LAD. This was a couple years ago. Patient has hypertension. He denies hyperlipidemia diabetes smoking or family history. He has no recent hospitalization. Denies bowel travel. Denies unilateral leg swelling. Denies hemoptysis.    The history is provided by the patient. No language interpreter was used.  Chest Pain   This is a new problem. The current episode started yesterday. The problem occurs constantly. The problem has not changed since onset.The pain is associated with eating. The pain is present in the substernal region. The pain is at a severity of 7/10. The pain is moderate. The quality of the pain is described as brief and pressure-like. The pain does not radiate. Duration of episode(s) is 2 days. Pertinent negatives include no abdominal pain, no diaphoresis, no fever, no headaches, no nausea, no palpitations, no shortness of breath and no vomiting. He has tried nothing for the symptoms. The treatment provided no relief. Risk factors include male gender.  His past medical history is significant for congenital heart disease and hypertension.  Pertinent negatives for past medical history include no DVT, no hyperlipidemia, no MI and no PE.  Pertinent negatives for family medical history include: no early MI.  Procedure history is positive for cardiac catheterization.    Past Medical History:  Diagnosis Date  . CAD (coronary artery  disease)    NONOBSTRUCTIVE  a. cath 8/10: prox to mid LAD 20%  . Chronic systolic heart failure (HCC)   . CKD (chronic kidney disease)    baseline creatinine approx 1.5  . Essential hypertension, benign   . Gout   . Hyperlipidemia   . Hypertrophic nonobstructive cardiomyopathy (HCC)    septal hypertrophy without LVOT  . NICM (nonischemic cardiomyopathy) (HCC)    a. echo 8/10: EF20-25%, mod LAE, severe asymmetric septal hypertrophy (? nonobs. HCM); grade 1 diast dyfxn.  EF 55% echo June 6    Patient Active Problem List   Diagnosis Date Noted  . Chest pain with high risk for cardiac etiology 12/28/2016  . Left inguinal hernia 01/08/2014  . Insomnia 05/19/2012  . Hypertrophic nonobstructive cardiomyopathy (HCC) 05/04/2011  . Chronic systolic heart failure (HCC)   . Non Obstructive CAD by cath 06/2009   . OBESITY, UNSPECIFIED 10/14/2010  . Non Ischemic Cardiomyopathy 08/20/2009  . HYPERTENSION, BENIGN SYSTEMIC 01/26/2007  . GASTROINTESTINAL HEMORRHAGE 01/26/2007    Past Surgical History:  Procedure Laterality Date  . KNEE ARTHROSCOPY     right       Home Medications    Prior to Admission medications   Medication Sig Start Date End Date Taking? Authorizing Provider  amLODipine (NORVASC) 10 MG tablet Take 1 tablet (10 mg total) by mouth daily. 05/04/16   Rollene Rotunda, MD  aspirin 81 MG tablet Take 81 mg by mouth daily.      Historical Provider, MD  B Complex Vitamins (VITAMIN B  COMPLEX PO) Take 1 tablet by mouth daily.      Historical Provider, MD  carvedilol (COREG) 25 MG tablet TAKE 2 TABLETS BY MOUTH TWICE DAILY WITH MEALS 05/04/16   Rollene Rotunda, MD  Cholecalciferol (VITAMIN D-3 PO) Take 1 tablet by mouth daily.    Historical Provider, MD  CINNAMON PO Take 1 tablet by mouth daily.      Historical Provider, MD  colchicine 0.6 MG tablet Take 2 tablets now, repeat 1 tablet 1 hour later. 01/07/16   Charm Rings, MD  Cyanocobalamin (VITAMIN B-12 PO) Take 1 tablet by mouth daily.       Historical Provider, MD  enalapril (VASOTEC) 20 MG tablet TAKE 1 TABLET BY MOUTH TWICE DAILY 11/18/16   Rollene Rotunda, MD  furosemide (LASIX) 40 MG tablet Take 1 tablet (40 mg total) by mouth daily. 05/04/16   Rollene Rotunda, MD  Garlic TABS Take 1 tablet by mouth daily.      Historical Provider, MD  meclizine (ANTIVERT) 25 MG tablet Take 1 tablet (25 mg total) by mouth 3 (three) times daily as needed for dizziness. 03/02/16   Pricilla Loveless, MD  naproxen (NAPROSYN) 500 MG tablet Take 1 tablet (500 mg total) by mouth 2 (two) times daily. 01/07/16   Charm Rings, MD  niacin 100 MG tablet Take 100 mg by mouth at bedtime.    Historical Provider, MD  promethazine-dextromethorphan (PROMETHAZINE-DM) 6.25-15 MG/5ML syrup Take 5 mLs by mouth 4 (four) times daily as needed for cough. 11/21/16   Shon Baton, MD  traMADol (ULTRAM) 50 MG tablet Take 1 tablet (50 mg total) by mouth every 6 (six) hours as needed for severe pain. 01/07/16   Charm Rings, MD  zolpidem (AMBIEN) 10 MG tablet Take 1 tablet (10 mg total) by mouth at bedtime as needed for sleep. 10/02/14 05/23/16  Rollene Rotunda, MD    Family History Family History  Problem Relation Age of Onset  . Kidney disease Brother   . Cancer Brother     prostate  . Kidney disease Father   . Diabetes Mother   . Heart attack Mother   . Heart disease Mother   . Cancer Sister     breast    Social History Social History  Substance Use Topics  . Smoking status: Never Smoker  . Smokeless tobacco: Never Used  . Alcohol use No     Allergies   Patient has no known allergies.   Review of Systems Review of Systems  Constitutional: Negative for chills, diaphoresis and fever.  HENT: Negative for congestion and facial swelling.   Eyes: Negative for discharge and visual disturbance.  Respiratory: Negative for shortness of breath.   Cardiovascular: Positive for chest pain. Negative for palpitations.  Gastrointestinal: Negative for abdominal pain,  diarrhea, nausea and vomiting.  Musculoskeletal: Negative for arthralgias and myalgias.  Skin: Negative for color change and rash.  Neurological: Negative for tremors, syncope and headaches.  Psychiatric/Behavioral: Negative for confusion and dysphoric mood.     Physical Exam Updated Vital Signs BP 124/89   Pulse 70   Temp 97.7 F (36.5 C) (Oral)   Resp 21   Ht 6' (1.829 m)   Wt 230 lb (104.3 kg)   SpO2 94%   BMI 31.19 kg/m   Physical Exam  Constitutional: He is oriented to person, place, and time. He appears well-developed and well-nourished.  HENT:  Head: Normocephalic and atraumatic.  Eyes: EOM are normal. Pupils are equal, round, and  reactive to light.  Neck: Normal range of motion. Neck supple. No JVD present.  Cardiovascular: Normal rate and regular rhythm.  Exam reveals no gallop and no friction rub.   No murmur heard. Pulmonary/Chest: No respiratory distress. He has no wheezes. He exhibits no tenderness.  Abdominal: He exhibits no distension and no mass. There is no tenderness. There is no rebound and no guarding.  Musculoskeletal: Normal range of motion. He exhibits no edema.  Neurological: He is alert and oriented to person, place, and time.  Skin: No rash noted. No pallor.  Psychiatric: He has a normal mood and affect. His behavior is normal.  Nursing note and vitals reviewed.    ED Treatments / Results  Labs (all labs ordered are listed, but only abnormal results are displayed) Labs Reviewed  BASIC METABOLIC PANEL - Abnormal; Notable for the following:       Result Value   Glucose, Bld 119 (*)    All other components within normal limits  CBC - Abnormal; Notable for the following:    RBC 5.91 (*)    MCV 67.9 (*)    MCH 22.7 (*)    RDW 18.2 (*)    All other components within normal limits  TROPONIN I - Abnormal; Notable for the following:    Troponin I 0.05 (*)    All other components within normal limits    EKG  EKG  Interpretation  Date/Time:  Tuesday December 28 2016 11:15:41 EST Ventricular Rate:  64 PR Interval:    QRS Duration: 137 QT Interval:  421 QTC Calculation: 435 R Axis:   -75 Text Interpretation:  Sinus rhythm Short PR interval Left bundle branch block No significant change since last tracing Confirmed by Joyce Leckey MD, DANIEL [phone]) on 12/28/2016 11:23:38 AM       Radiology Dg Chest 2 View  Result Date: 12/28/2016 CLINICAL DATA:  Left upper chest discomfort, chest pain EXAM: CHEST  2 VIEW COMPARISON:  11/21/2016 FINDINGS: There is mild cardiomegaly. Lungs clear. No effusions. No acute bony abnormality. IMPRESSION: No active cardiopulmonary disease. Electronically Signed   By: Charlett Nose M.D.   On: 12/28/2016 11:52    Procedures Procedures (including critical care time)  Medications Ordered in ED Medications  sodium chloride 0.9 % bolus 1,000 mL (1,000 mLs Intravenous New Bag/Given 12/28/16 1200)  fentaNYL (SUBLIMAZE) injection 50 mcg (not administered)  aspirin chewable tablet 324 mg (324 mg Oral Given 12/28/16 1135)  nitroGLYCERIN (NITROSTAT) SL tablet 0.4 mg (0.4 mg Sublingual Given 12/28/16 1149)  gi cocktail (Maalox,Lidocaine,Donnatal) (30 mLs Oral Given 12/28/16 1136)     Initial Impression / Assessment and Plan / ED Course  I have reviewed the triage vital signs and the nursing notes.  Pertinent labs & imaging results that were available during my care of the patient were reviewed by me and considered in my medical decision making (see chart for details).     61 yo M With a chief complaint of chest pain. This sounds atypical based on history. Will treat with a GI cocktail. Delta troponin.  Initial trop +.  I discussed case with Dr. Rennis Golden, cardiology. He reviewed the patient's record noting that he had had an indeterminate stress test recently and has not yet followed up. That with a positive troponin no echo in the last 3 years recommended admission to the hospital. Will  start on a heparin drip. Transfer to [facility].  CRITICAL CARE Performed by: Rae Roam   Total critical care time: 56  minutes  Critical care time was exclusive of separately billable procedures and treating other patients.  Critical care was necessary to treat or prevent imminent or life-threatening deterioration.  Critical care was time spent personally by me on the following activities: development of treatment plan with patient and/or surrogate as well as nursing, discussions with consultants, evaluation of patient's response to treatment, examination of patient, obtaining history from patient or surrogate, ordering and performing treatments and interventions, ordering and review of laboratory studies, ordering and review of radiographic studies, pulse oximetry and re-evaluation of patient's condition.  The patients results and plan were reviewed and discussed.   Any x-rays performed were independently reviewed by myself.   Differential diagnosis were considered with the presenting HPI.  Medications  sodium chloride 0.9 % bolus 1,000 mL (1,000 mLs Intravenous New Bag/Given 12/28/16 1200)  fentaNYL (SUBLIMAZE) injection 50 mcg (not administered)  aspirin chewable tablet 324 mg (324 mg Oral Given 12/28/16 1135)  nitroGLYCERIN (NITROSTAT) SL tablet 0.4 mg (0.4 mg Sublingual Given 12/28/16 1149)  gi cocktail (Maalox,Lidocaine,Donnatal) (30 mLs Oral Given 12/28/16 1136)    Vitals:   12/28/16 1148 12/28/16 1156 12/28/16 1200 12/28/16 1230  BP: (S) 122/84 (S) (!) 86/61 103/75 124/89  Pulse:  72 68 70  Resp:  17 12 21   Temp:      TempSrc:      SpO2:  100% 95% 94%  Weight:      Height:        Final diagnoses:  NSTEMI (non-ST elevated myocardial infarction) [facility])    Admission/ observation were discussed with the admitting physician, patient and/or family and they are comfortable with the plan.   Final Clinical Impressions(s) / ED Diagnoses   Final diagnoses:  NSTEMI (non-ST  elevated myocardial infarction) [facility])    New Prescriptions New Prescriptions   No medications on file     Melene Plan, DO 12/28/16 1234

## 2016-12-29 ENCOUNTER — Encounter (HOSPITAL_COMMUNITY): Payer: Self-pay | Admitting: *Deleted

## 2016-12-29 ENCOUNTER — Observation Stay (HOSPITAL_COMMUNITY): Payer: BLUE CROSS/BLUE SHIELD

## 2016-12-29 DIAGNOSIS — R079 Chest pain, unspecified: Secondary | ICD-10-CM | POA: Diagnosis not present

## 2016-12-29 DIAGNOSIS — R778 Other specified abnormalities of plasma proteins: Secondary | ICD-10-CM | POA: Diagnosis present

## 2016-12-29 DIAGNOSIS — R7989 Other specified abnormal findings of blood chemistry: Secondary | ICD-10-CM

## 2016-12-29 LAB — HEPARIN LEVEL (UNFRACTIONATED): Heparin Unfractionated: 0.45 IU/mL (ref 0.30–0.70)

## 2016-12-29 LAB — CBC
HCT: 39.2 % (ref 39.0–52.0)
Hemoglobin: 12.4 g/dL — ABNORMAL LOW (ref 13.0–17.0)
MCH: 21.9 pg — ABNORMAL LOW (ref 26.0–34.0)
MCHC: 31.6 g/dL (ref 30.0–36.0)
MCV: 69.4 fL — ABNORMAL LOW (ref 78.0–100.0)
Platelets: 157 10*3/uL (ref 150–400)
RBC: 5.65 MIL/uL (ref 4.22–5.81)
RDW: 17 % — ABNORMAL HIGH (ref 11.5–15.5)
WBC: 6.2 10*3/uL (ref 4.0–10.5)

## 2016-12-29 LAB — TROPONIN I: Troponin I: 0.06 ng/mL (ref <0.03)

## 2016-12-29 MED ORDER — NITROGLYCERIN 0.4 MG SL SUBL
SUBLINGUAL_TABLET | SUBLINGUAL | Status: AC
  Filled 2016-12-29: qty 2

## 2016-12-29 MED ORDER — METOPROLOL TARTRATE 5 MG/5ML IV SOLN
INTRAVENOUS | Status: AC
  Filled 2016-12-29: qty 5

## 2016-12-29 MED ORDER — ACETAMINOPHEN 325 MG PO TABS: 650.0000 mg | ORAL_TABLET | ORAL | Status: AC | PRN

## 2016-12-29 MED ORDER — NITROGLYCERIN 0.4 MG SL SUBL
0.8000 mg | SUBLINGUAL_TABLET | Freq: Once | SUBLINGUAL | Status: AC
  Administered 2016-12-29: 0.8 mg via SUBLINGUAL

## 2016-12-29 MED ORDER — NITROGLYCERIN 0.4 MG SL SUBL: 0.4000 mg | SUBLINGUAL_TABLET | SUBLINGUAL | Status: DC | PRN

## 2016-12-29 MED ORDER — METOPROLOL TARTRATE 5 MG/5ML IV SOLN: 5.0000 mg | Freq: Once | INTRAVENOUS | Status: DC

## 2016-12-29 MED ORDER — IOPAMIDOL (ISOVUE-370) INJECTION 76%
INTRAVENOUS | Status: AC
  Administered 2016-12-29: 80 mL
  Filled 2016-12-29: qty 100

## 2016-12-29 NOTE — Progress Notes (Signed)
Progress Note  Patient Name: Craig Prince Date of Encounter: 12/29/2016  Primary Cardiologist: Hochrein  Subjective   No complaints Has 18 g LUE iv    Inpatient Medications    Scheduled Meds: . amLODipine  10 mg Oral Daily  . aspirin EC  81 mg Oral Daily  . carvedilol  50 mg Oral BID WC  . enalapril  20 mg Oral BID  . furosemide  40 mg Oral Daily  . Influenza vac split quadrivalent PF  0.5 mL Intramuscular Tomorrow-1000  . niacin  100 mg Oral QHS   Continuous Infusions: . heparin 1,350 Units/hr (12/29/16 0439)   PRN Meds: acetaminophen, ondansetron (ZOFRAN) IV, zolpidem   Vital Signs    Vitals:   12/29/16 0325 12/29/16 0400 12/29/16 0500 12/29/16 0800  BP: 121/82 (!) 131/92 (!) 142/102 (!) 154/103  Pulse: 63 68 63 (!) 58  Resp: (!) 21 18 15 13   Temp: 98.3 F (36.8 C)   98.4 F (36.9 C)  TempSrc: Oral   Oral  SpO2: 97% 100% 99% 99%  Weight:      Height:        Intake/Output Summary (Last 24 hours) at 12/29/16 0837 Last data filed at 12/29/16 0439  Gross per 24 hour  Intake          1574.43 ml  Output              650 ml  Net           924.43 ml   Filed Weights   12/28/16 1109 12/28/16 1830  Weight: 230 lb (104.3 kg) 229 lb 15 oz (104.3 kg)    Telemetry    NSR - Personally Reviewed  ECG    SR LBBB  - Personally Reviewed  Physical Exam   GEN: No acute distress.   Neck: No JVD Cardiac: RRR, no murmurs, rubs, or gallops.  Respiratory: Clear to auscultation bilaterally. GI: Soft, nontender, non-distended  MS: No edema; No deformity. Neuro:  Nonfocal  Psych: Normal affect   Labs    Chemistry Recent Labs Lab 12/28/16 1119  NA 135  K 3.6  CL 102  CO2 25  GLUCOSE 119*  BUN 17  CREATININE 1.08  CALCIUM 9.1  GFRNONAA >60  GFRAA >60  ANIONGAP 8     Hematology Recent Labs Lab 12/28/16 1119 12/29/16 0328  WBC 6.0 6.2  RBC 5.91* 5.65  HGB 13.4 12.4*  HCT 40.1 39.2  MCV 67.9* 69.4*  MCH 22.7* 21.9*  MCHC 33.4 31.6  RDW 18.2*  17.0*  PLT 170 157    Cardiac Enzymes Recent Labs Lab 12/28/16 1440 12/28/16 1933 12/28/16 2225 12/29/16 0109  TROPONINI 0.04* 0.05* 0.05* 0.06*   No results for input(s): TROPIPOC in the last 168 hours.   BNPNo results for input(s): BNP, PROBNP in the last 168 hours.   DDimer No results for input(s): DDIMER in the last 168 hours.   Radiology    Dg Chest 2 View  Result Date: 12/28/2016 CLINICAL DATA:  Left upper chest discomfort, chest pain EXAM: CHEST  2 VIEW COMPARISON:  11/21/2016 FINDINGS: There is mild cardiomegaly. Lungs clear. No effusions. No acute bony abnormality. IMPRESSION: No active cardiopulmonary disease. Electronically Signed   By: Charlett Nose M.D.   On: 12/28/2016 11:52    Cardiac Studies   Echo ;  EF 50% 10/23/13    Patient Profile     61 y.o. male admitted with chest pain. History of non ischemic DCM  with last EF improved 50% Admitted with chest pain. 2014 abnormal stress test with drop in BP but f/u studies not done. ECG LBBB and troponins .o4 no evolutions  Assessment & Plan    Chest Pain:  Dr Antoine Poche ordered cardiac CT discussed with patient Nurse indicates LUE 18 g works fine. HR 64 will try to do CT around 9:30 with 5 mg iv lopressor and SL nitro which he has had before. CXR NAD Patient pain free this am     Signed, Charlton Haws, MD  12/29/2016, 8:37 AM

## 2016-12-29 NOTE — Discharge Instructions (Signed)
Chest Wall Pain °Chest wall pain is pain in or around the bones and muscles of your chest. Sometimes, an injury causes this pain. Sometimes, the cause may not be known. This pain may take several weeks or longer to get better. °Follow these instructions at home: °Pay attention to any changes in your symptoms. Take these actions to help with your pain: °· Rest as told by your health care provider. °· Avoid activities that cause pain. These include any activities that use your chest muscles or your abdominal and side muscles to lift heavy items. °· If directed, apply ice to the painful area: °¨ Put ice in a plastic bag. °¨ Place a towel between your skin and the bag. °¨ Leave the ice on for 20 minutes, 2-3 times per day. °· Take over-the-counter and prescription medicines only as told by your health care provider. °· Do not use tobacco products, including cigarettes, chewing tobacco, and e-cigarettes. If you need help quitting, ask your health care provider. °· Keep all follow-up visits as told by your health care provider. This is important. °Contact a health care provider if: °· You have a fever. °· Your chest pain becomes worse. °· You have new symptoms. °Get help right away if: °· You have nausea or vomiting. °· You feel sweaty or light-headed. °· You have a cough with phlegm (sputum) or you cough up blood. °· You develop shortness of breath. °This information is not intended to replace advice given to you by your health care provider. Make sure you discuss any questions you have with your health care provider. °Document Released: 11/15/2005 Document Revised: 03/25/2016 Document Reviewed: 02/10/2015 °Elsevier Interactive Patient Education © 2017 [employer] ° °

## 2016-12-29 NOTE — Progress Notes (Signed)
Patient alert and oriented X 4 and in no distress.  Patient given discharge instructions regarding signs and symptoms to report, medications, diet, activity, and upcoming appointments.  He verbalized understanding of all instructions. Telemetry and Peripheral IV discontinued. Patient confirmed that he had all of his personal belongings. He was transported out via wheelchair by NT.

## 2016-12-29 NOTE — Discharge Summary (Signed)
Patient ID: Craig Prince,  MRN: [id number], DOB/AGE: Jan 29, 1956 61 y.o.  Admit date: 12/28/2016 Discharge date: 12/29/2016  Primary Care Provider: Georgianne Fick, MD Primary Cardiologist: Dr Antoine Poche  Discharge Diagnoses Principal Problem:   Chest pain with high risk for cardiac etiology Active Problems:   Essential hypertension   History of NICM- resolved at last echo   Non Obstructive CAD by cath 06/2009   Chest pain   Troponin level elevated    Procedures: Coronary CTA 12/29/16   Hospital Course:  61 y/o with a history of a nonischemic cardiomyopathy with an original ejection fraction of 25% which improved to 50% on followup in 2012 and 14. He does have some septal hypertrophy. He had non obstructive CAD in 2010. He had an exercise treadmill test in Feb 2016. It was reported that he dropped his blood pressure. He was able to go for about 9 minutes and 30 seconds without symptoms however.  The pt was admitted to [facility] 12/28/16 with chest pain. His symptoms were not classic for angina but he did have slight Troponin elevation. He underwent coronary CT on 12/29/16 and this revealed normal coronaries. Ca++ score 0. Dr Antoine Poche feels he can be discharged 12/29/16 pm. He has a follow up scheduled for 01/13/17 and will keep that.   Discharge Vitals:  Blood pressure 128/85, pulse 74, temperature 98.3 F (36.8 C), temperature source Oral, resp. rate 17, height 6' (1.829 m), weight 229 lb 15 oz (104.3 kg), SpO2 100 %.    Labs: Results for orders placed or performed during the hospital encounter of 12/28/16 (from the past 24 hour(s))  MRSA PCR Screening     Status: None   Collection Time: 12/28/16  6:45 PM  Result Value Ref Range   MRSA by PCR NEGATIVE NEGATIVE  Heparin level (unfractionated)     Status: None   Collection Time: 12/28/16  7:33 PM  Result Value Ref Range   Heparin Unfractionated 0.44 0.30 - 0.70 IU/mL  Troponin I-serum (0, 3, 6 hours)     Status: Abnormal   Collection Time: 12/28/16  7:33 PM  Result Value Ref Range   Troponin I 0.05 (HH) <0.03 ng/mL  Troponin I-serum (0, 3, 6 hours)     Status: Abnormal   Collection Time: 12/28/16 10:25 PM  Result Value Ref Range   Troponin I 0.05 (HH) <0.03 ng/mL  Troponin I-serum (0, 3, 6 hours)     Status: Abnormal   Collection Time: 12/29/16  1:09 AM  Result Value Ref Range   Troponin I 0.06 (HH) <0.03 ng/mL  Heparin level (unfractionated)     Status: None   Collection Time: 12/29/16  3:28 AM  Result Value Ref Range   Heparin Unfractionated 0.45 0.30 - 0.70 IU/mL  CBC     Status: Abnormal   Collection Time: 12/29/16  3:28 AM  Result Value Ref Range   WBC 6.2 4.0 - 10.5 K/uL   RBC 5.65 4.22 - 5.81 MIL/uL   Hemoglobin 12.4 (L) 13.0 - 17.0 g/dL   HCT 56.2 13.0 - 86.5 %   MCV 69.4 (L) 78.0 - 100.0 fL   MCH 21.9 (L) 26.0 - 34.0 pg   MCHC 31.6 30.0 - 36.0 g/dL   RDW 78.4 (H) 69.6 - 29.5 %   Platelets 157 150 - 400 K/uL    Disposition:  Follow-up Information    Rollene Rotunda, MD Follow up on 01/13/2017.   Specialty:  Cardiology Why:  4pm Contact information: [address] [place] [address] [phone]           Discharge Medications:  Allergies as of 12/29/2016   No Known Allergies     Medication List    STOP taking these medications   meclizine 25 MG tablet Commonly known as:  ANTIVERT   naproxen 500 MG tablet Commonly known as:  NAPROSYN   promethazine-dextromethorphan 6.25-15 MG/5ML syrup Commonly known as:  PROMETHAZINE-DM     TAKE these medications   acetaminophen 325 MG tablet Commonly known as:  TYLENOL Take 2 tablets (650 mg total) by mouth every 4 (four) hours as needed for headache or mild pain.   amLODipine 10 MG tablet Commonly known as:  NORVASC Take 1 tablet (10 mg total) by mouth daily.   aspirin 81 MG tablet Take 81 mg by mouth daily.   carvedilol 25 MG tablet Commonly known as:  COREG TAKE 2 TABLETS BY MOUTH TWICE DAILY WITH  MEALS   CINNAMON PO Take 1 tablet by mouth daily.   colchicine 0.6 MG tablet Take 2 tablets now, repeat 1 tablet 1 hour later.   enalapril 20 MG tablet Commonly known as:  VASOTEC TAKE 1 TABLET BY MOUTH TWICE DAILY   furosemide 40 MG tablet Commonly known as:  LASIX Take 1 tablet (40 mg total) by mouth daily.   Garlic Tabs Take 1 tablet by mouth daily.   niacin 100 MG tablet Take 100 mg by mouth at bedtime.   traMADol 50 MG tablet Commonly known as:  ULTRAM Take 1 tablet (50 mg total) by mouth every 6 (six) hours as needed for severe pain.   VITAMIN B COMPLEX PO Take 1 tablet by mouth daily.   VITAMIN B-12 PO Take 1 tablet by mouth daily.   VITAMIN D-3 PO Take 1 tablet by mouth daily.   zolpidem 10 MG tablet Commonly known as:  AMBIEN Take 1 tablet (10 mg total) by mouth at bedtime as needed for sleep.        Duration of Discharge Encounter: Greater than 30 minutes including physician time.  Jolene Provost PA-C 12/29/2016 3:55 PM

## 2016-12-29 NOTE — Progress Notes (Signed)
ANTICOAGULATION CONSULT NOTE - Follow-Up  Pharmacy Consult for Heparin Indication: chest pain/ACS  No Known Allergies  Patient Measurements: Height: 6' (182.9 cm) Weight: 229 lb 15 oz (104.3 kg) IBW/kg (Calculated) : 77.6 Heparin Dosing Weight: 99 kg  Labs:  Recent Labs  12/28/16 1119  12/28/16 1933 12/28/16 2225 12/29/16 0109 12/29/16 0328  HGB 13.4  --   --   --   --  12.4*  HCT 40.1  --   --   --   --  39.2  PLT 170  --   --   --   --  157  HEPARINUNFRC  --   --  0.44  --   --  0.45  CREATININE 1.08  --   --   --   --   --   TROPONINI 0.05*  < > 0.05* 0.05* 0.06*  --   < > = values in this interval not displayed.  Estimated Creatinine Clearance: 89.7 mL/min (by C-G formula based on SCr of 1.08 mg/dL).   Infusions:  . heparin 1,350 Units/hr (12/29/16 0439)    Assessment: 61yo male with history of CAD, CHF, CKD, HTN and HLD presents to [facility] with CP and started on Heparin for r/o ACS.  Heparin level this morning remains therapeutic (HL 0.45 << 0.44, goal of 0.3-0.7). Hgb 39.2, plts wnl. No overt s/sx of bleeding at this time.   Goal of Therapy:  Heparin level 0.3-0.7 units/ml Monitor platelets by anticoagulation protocol: Yes   Plan:  1. Continue Heparin at 1350 units/hr (13.5 ml/hr) 2. Will continue to monitor for any signs/symptoms of bleeding and will follow up with heparin level in the a.m.   Thank you for allowing pharmacy to be a part of this patient's care.  Georgina Pillion, PharmD, BCPS Clinical Pharmacist Pager: [phone] Clinical phone for 12/29/2016 from 7a-3:30p: [phone] If after 3:30p, please call main pharmacy at: [phone] 12/29/2016 11:17 AM

## 2017-01-11 NOTE — Progress Notes (Signed)
HPI The patient presents for followup of his hypertension and cardiomyopathy. He has a nonischemic cardiomyopathy with an original ejection fraction of 25% to 50% on followup in 2012 and 14.  He does have some septal hypertrophy. I sent him for an exercise treadmill test. It was reported that he dropped his blood pressure. He was able to go for about 9 minutes and 30 seconds without symptoms however. I was going to get a dobutamine echocardiogram to look for high risk findings and inducible gradient. However, he canceled this appointment.  He came into the hospital with chest pain and ruled out. He subsequently had a CT coronary angiogram with normal coronaries and zero calcium score.    Since that time he's done well. He's not been active yet but is going to get back to doing some activity.  The patient denies any new symptoms such as chest discomfort, neck or arm discomfort. There has been no new shortness of breath, PND or orthopnea. There have been no reported palpitations, presyncope or syncope.   No Known Allergies  Current Outpatient Prescriptions  Medication Sig Dispense Refill  . acetaminophen (TYLENOL) 325 MG tablet Take 2 tablets (650 mg total) by mouth every 4 (four) hours as needed for headache or mild pain.    Marland Kitchen amLODipine (NORVASC) 10 MG tablet Take 1 tablet (10 mg total) by mouth daily. 90 tablet 3  . aspirin 81 MG tablet Take 81 mg by mouth daily.      . B Complex Vitamins (VITAMIN B COMPLEX PO) Take 1 tablet by mouth daily.      . carvedilol (COREG) 25 MG tablet TAKE 2 TABLETS BY MOUTH TWICE DAILY WITH MEALS 360 tablet 2  . Cholecalciferol (VITAMIN D-3 PO) Take 1 tablet by mouth daily.    Marland Kitchen CINNAMON PO Take 1 tablet by mouth daily.      . colchicine 0.6 MG tablet Take 2 tablets now, repeat 1 tablet 1 hour later. 3 tablet 0  . Cyanocobalamin (VITAMIN B-12 PO) Take 1 tablet by mouth daily.      . enalapril (VASOTEC) 20 MG tablet TAKE 1 TABLET BY MOUTH TWICE DAILY 180 tablet 0  .  furosemide (LASIX) 40 MG tablet Take 1 tablet (40 mg total) by mouth daily. 90 tablet 3  . Garlic TABS Take 1 tablet by mouth daily.      . traMADol (ULTRAM) 50 MG tablet Take 1 tablet (50 mg total) by mouth every 6 (six) hours as needed for severe pain. 10 tablet 0  . zolpidem (AMBIEN) 10 MG tablet Take 1 tablet (10 mg total) by mouth at bedtime as needed for sleep. 30 tablet 3   No current facility-administered medications for this visit.     Past Medical History:  Diagnosis Date  . CAD (coronary artery disease)    NONOBSTRUCTIVE  a. cath 8/10: prox to mid LAD 20%  . Chronic systolic heart failure (HCC)   . CKD (chronic kidney disease)    baseline creatinine approx 1.5  . Essential hypertension, benign   . Gout   . Hyperlipidemia   . Hypertrophic nonobstructive cardiomyopathy (HCC)    septal hypertrophy without LVOT  . NICM (nonischemic cardiomyopathy) (HCC)    a. echo 8/10: EF20-25%, mod LAE, severe asymmetric septal hypertrophy (? nonobs. HCM); grade 1 diast dyfxn.  EF 55% echo June 6    Past Surgical History:  Procedure Laterality Date  . KNEE ARTHROSCOPY     right    ROS:  As stated in the HPI and negative for all other systems.  PHYSICAL EXAM BP 131/87   Pulse 74   Ht 6' (1.829 m)   Wt 238 lb (108 kg)   BMI 32.28 kg/m  GENERAL:  Well appearing HEENT:  Pupils equal round and reactive, fundi not visualized, oral mucosa unremarkable NECK:  No jugular venous distention, waveform within normal limits, carotid upstroke brisk and symmetric, no bruits, no thyromegaly LYMPHATICS:  No cervical, inguinal adenopathy LUNGS:  Clear to auscultation bilaterally BACK:  No CVA tenderness CHEST:  Unremarkable HEART:  PMI not displaced or sustained,S1 and S2 within normal limits, no S3, no S4, no clicks, no rubs, no murmurs ABD:  Flat, positive bowel sounds normal in frequency in pitch, no bruits, no rebound, no guarding, no midline pulsatile mass, no hepatomegaly, no  splenomegaly EXT:  2 plus pulses throughout, no edema, no cyanosis no clubbing   ASSESSMENT AND PLAN  NICM:  He does have some septal hypertrophy.   However, this was 25 mm. He has no other high-risk findings. His EF was better on his previous echo. At this point I'll follow with an echo when I see him next year.  HTN:  His blood pressure is at the upper limits of acceptable. He will stay on the meds as listed.   CHEST PAIN:  He had a calcium score of zero and normal coronaries.  No change in therapy is indicated.  No further imaging is indicated.

## 2017-01-13 ENCOUNTER — Encounter: Payer: Self-pay | Admitting: Cardiology

## 2017-01-13 ENCOUNTER — Ambulatory Visit (INDEPENDENT_AMBULATORY_CARE_PROVIDER_SITE_OTHER): Payer: BLUE CROSS/BLUE SHIELD | Admitting: Cardiology

## 2017-01-13 VITALS — BP 131/87 | HR 74 | Ht 72.0 in | Wt 238.0 lb

## 2017-01-13 DIAGNOSIS — I422 Other hypertrophic cardiomyopathy: Secondary | ICD-10-CM

## 2017-01-13 NOTE — Patient Instructions (Signed)

## 2017-01-14 ENCOUNTER — Encounter: Payer: Self-pay | Admitting: Cardiology

## 2017-02-17 ENCOUNTER — Other Ambulatory Visit: Payer: Self-pay | Admitting: Cardiology

## 2017-02-17 MED ORDER — ENALAPRIL MALEATE 20 MG PO TABS: 20.0000 mg | ORAL_TABLET | Freq: Two times a day (BID) | ORAL | 3 refills | Status: DC

## 2017-02-17 MED ORDER — FUROSEMIDE 40 MG PO TABS: 40.0000 mg | ORAL_TABLET | Freq: Every day | ORAL | 3 refills | Status: DC

## 2017-02-17 MED ORDER — CARVEDILOL 25 MG PO TABS: 50.0000 mg | ORAL_TABLET | Freq: Two times a day (BID) | ORAL | 3 refills | Status: DC

## 2017-02-17 MED ORDER — AMLODIPINE BESYLATE 10 MG PO TABS: 10.0000 mg | ORAL_TABLET | Freq: Every day | ORAL | 3 refills | Status: DC

## 2017-02-17 NOTE — Telephone Encounter (Signed)
Pt is requesting a refill on ambien?  Please advise 

## 2017-02-17 NOTE — Telephone Encounter (Signed)
New message    *STAT* If patient is at the pharmacy, call can be transferred to refill team.   1. Which medications need to be refilled? (please list name of each medication and dose if known) ambien 10 mg, carvedilol 25 mg, furosemide 40 mg, amlodipine 10 mg, enalapril 20 mg  2. Which pharmacy/location (including street and city if local pharmacy) is medication to be sent to? [facility] on [address]   3. Do they need a 30 day or 90 day supply? 30 day

## 2017-02-19 ENCOUNTER — Encounter (HOSPITAL_BASED_OUTPATIENT_CLINIC_OR_DEPARTMENT_OTHER): Payer: Self-pay | Admitting: Emergency Medicine

## 2017-02-19 ENCOUNTER — Emergency Department (HOSPITAL_BASED_OUTPATIENT_CLINIC_OR_DEPARTMENT_OTHER)
Admission: EM | Admit: 2017-02-19 | Discharge: 2017-02-19 | Disposition: A | Discharge status: Home / Self Care | Payer: BLUE CROSS/BLUE SHIELD | Attending: Emergency Medicine | Admitting: Emergency Medicine

## 2017-02-19 DIAGNOSIS — Z7982 Long term (current) use of aspirin: Secondary | ICD-10-CM | POA: Insufficient documentation

## 2017-02-19 DIAGNOSIS — I2510 Atherosclerotic heart disease of native coronary artery without angina pectoris: Secondary | ICD-10-CM | POA: Insufficient documentation

## 2017-02-19 DIAGNOSIS — M25561 Pain in right knee: Secondary | ICD-10-CM | POA: Diagnosis present

## 2017-02-19 DIAGNOSIS — I110 Hypertensive heart disease with heart failure: Secondary | ICD-10-CM | POA: Insufficient documentation

## 2017-02-19 DIAGNOSIS — M25461 Effusion, right knee: Secondary | ICD-10-CM | POA: Diagnosis not present

## 2017-02-19 DIAGNOSIS — Z79899 Other long term (current) drug therapy: Secondary | ICD-10-CM | POA: Diagnosis not present

## 2017-02-19 DIAGNOSIS — I5022 Chronic systolic (congestive) heart failure: Secondary | ICD-10-CM | POA: Diagnosis not present

## 2017-02-19 LAB — SYNOVIAL CELL COUNT + DIFF, W/ CRYSTALS
Lymphocytes-Synovial Fld: 3 % (ref 0–20)
Monocyte-Macrophage-Synovial Fluid: 4 % — ABNORMAL LOW (ref 50–90)
Neutrophil, Synovial: 93 % — ABNORMAL HIGH (ref 0–25)
WBC, Synovial: 24375 /mm3 — ABNORMAL HIGH (ref 0–200)

## 2017-02-19 LAB — GRAM STAIN

## 2017-02-19 MED ORDER — ONDANSETRON 8 MG PO TBDP: 8.0000 mg | ORAL_TABLET | Freq: Three times a day (TID) | ORAL | 0 refills | Status: DC | PRN

## 2017-02-19 MED ORDER — PENTAFLUOROPROP-TETRAFLUOROETH EX AERO
INHALATION_SPRAY | CUTANEOUS | Status: AC
  Filled 2017-02-19: qty 30

## 2017-02-19 MED ORDER — LIDOCAINE-EPINEPHRINE 2 %-1:100000 IJ SOLN
20.0000 mL | Freq: Once | INTRAMUSCULAR | Status: DC
  Filled 2017-02-19: qty 1

## 2017-02-19 MED ORDER — COLCHICINE 0.6 MG PO TABS
1.2000 mg | ORAL_TABLET | Freq: Once | ORAL | Status: AC
  Administered 2017-02-19: 1.2 mg via ORAL
  Filled 2017-02-19: qty 2

## 2017-02-19 MED ORDER — PENTAFLUOROPROP-TETRAFLUOROETH EX AERO: INHALATION_SPRAY | CUTANEOUS | Status: DC | PRN

## 2017-02-19 MED ORDER — PREDNISONE 10 MG PO TABS: 20.0000 mg | ORAL_TABLET | Freq: Every day | ORAL | 0 refills | Status: DC

## 2017-02-19 MED ORDER — ONDANSETRON 8 MG PO TBDP
8.0000 mg | ORAL_TABLET | Freq: Once | ORAL | Status: AC
Start: 2017-02-19 — End: 2017-02-19
  Administered 2017-02-19: 8 mg via ORAL
  Filled 2017-02-19: qty 1

## 2017-02-19 MED ORDER — COLCHICINE 0.6 MG PO TABS
0.6000 mg | ORAL_TABLET | Freq: Once | ORAL | Status: AC
  Administered 2017-02-19: 0.6 mg via ORAL
  Filled 2017-02-19: qty 1

## 2017-02-19 MED ORDER — HYDROCODONE-ACETAMINOPHEN 5-325 MG PO TABS
1.0000 | ORAL_TABLET | Freq: Once | ORAL | Status: AC
  Administered 2017-02-19: 1 via ORAL
  Filled 2017-02-19: qty 1

## 2017-02-19 MED ORDER — OXYCODONE-ACETAMINOPHEN 5-325 MG PO TABS: 1.0000 | ORAL_TABLET | Freq: Four times a day (QID) | ORAL | 0 refills | Status: DC | PRN

## 2017-02-19 NOTE — ED Provider Notes (Signed)
[facility]   CSN: [id number] Arrival date & time: 02/19/17  1209     History   Chief Complaint Chief Complaint  Patient presents with  . Knee Pain    HPI ANTONEO GHRIST is a 61 y.o. male.  HPI ZYHEIR DAFT is a 61 y.o. male with history of gout, presents to emergency department complaining of right knee pain and swelling. Patient states swelling started approximately a week ago. He reports additional swelling to the right foot. States unable to bend his knee or walk on his leg. History of gout, this states it feels the same. He states he normally gets gout in his great toe but he does admit to having it in his knee once before. He states at that time they try to draw fluid but couldn't so they did not definitively diagnosed him but treated with pain medications. He denies any numbness or weakness to his leg. No injuries. No fever or chills.  Past Medical History:  Diagnosis Date  . CAD (coronary artery disease)    NONOBSTRUCTIVE  a. cath 8/10: prox to mid LAD 20%  . Chronic systolic heart failure (HCC)   . Essential hypertension, benign   . Gout   . Hypertrophic nonobstructive cardiomyopathy (HCC)    septal hypertrophy without LVOT  . NICM (nonischemic cardiomyopathy) (HCC)    a. echo 8/10: EF20-25%, mod LAE, severe asymmetric septal hypertrophy (? nonobs. HCM); grade 1 diast dyfxn.  EF 55% echo June 6    Patient Active Problem List   Diagnosis Date Noted  . Troponin level elevated 12/29/2016  . Chest pain with high risk for cardiac etiology 12/28/2016  . Chest pain 12/28/2016  . Left inguinal hernia 01/08/2014  . Insomnia 05/19/2012  . Hypertrophic nonobstructive cardiomyopathy (HCC) 05/04/2011  . Chronic systolic heart failure (HCC)   . Non Obstructive CAD by cath 06/2009   . OBESITY, UNSPECIFIED 10/14/2010  . History of NICM- resolved at last echo 08/20/2009  . Essential hypertension 01/26/2007  . GASTROINTESTINAL HEMORRHAGE 01/26/2007     Past Surgical History:  Procedure Laterality Date  . KNEE ARTHROSCOPY     right       Home Medications    Prior to Admission medications   Medication Sig Start Date End Date Taking? Authorizing Provider  acetaminophen (TYLENOL) 325 MG tablet Take 2 tablets (650 mg total) by mouth every 4 (four) hours as needed for headache or mild pain. 12/29/16  Yes Luke K Kilroy, PA-C  amLODipine (NORVASC) 10 MG tablet Take 1 tablet (10 mg total) by mouth daily. 02/17/17  Yes Rollene Rotunda, MD  aspirin 81 MG tablet Take 81 mg by mouth daily.     Yes Historical Provider, MD  B Complex Vitamins (VITAMIN B COMPLEX PO) Take 1 tablet by mouth daily.     Yes Historical Provider, MD  carvedilol (COREG) 25 MG tablet Take 2 tablets (50 mg total) by mouth 2 (two) times daily with a meal. 02/17/17  Yes Rollene Rotunda, MD  Cholecalciferol (VITAMIN D-3 PO) Take 1 tablet by mouth daily.   Yes Historical Provider, MD  CINNAMON PO Take 1 tablet by mouth daily.     Yes Historical Provider, MD  Cyanocobalamin (VITAMIN B-12 PO) Take 1 tablet by mouth daily.     Yes Historical Provider, MD  enalapril (VASOTEC) 20 MG tablet Take 1 tablet (20 mg total) by mouth 2 (two) times daily. 02/17/17  Yes Rollene Rotunda, MD  furosemide (LASIX) 40 MG  tablet Take 1 tablet (40 mg total) by mouth daily. 02/17/17  Yes Rollene Rotunda, MD  Garlic TABS Take 1 tablet by mouth daily.     Yes Historical Provider, MD  zolpidem (AMBIEN) 10 MG tablet Take 1 tablet (10 mg total) by mouth at bedtime as needed for sleep. 10/02/14 02/19/17 Yes Rollene Rotunda, MD  colchicine 0.6 MG tablet Take 2 tablets now, repeat 1 tablet 1 hour later. 01/07/16   Charm Rings, MD  traMADol (ULTRAM) 50 MG tablet Take 1 tablet (50 mg total) by mouth every 6 (six) hours as needed for severe pain. 01/07/16   Charm Rings, MD    Family History Family History  Problem Relation Age of Onset  . Kidney disease Brother   . Cancer Brother     prostate  . Kidney disease Father    . Diabetes Mother   . Heart attack Mother 66  . Heart disease Mother   . Cancer Sister     breast  . CAD Brother 72    60% blockage    Social History Social History  Substance Use Topics  . Smoking status: Never Smoker  . Smokeless tobacco: Never Used  . Alcohol use No     Allergies   Patient has no known allergies.   Review of Systems Review of Systems  Constitutional: Negative for chills and fever.  Musculoskeletal: Positive for arthralgias and joint swelling.  Neurological: Negative for weakness and numbness.  All other systems reviewed and are negative.    Physical Exam Updated Vital Signs BP (!) 164/108 (BP Location: Right Arm)   Pulse 94   Temp 98.2 F (36.8 C) (Oral)   Resp 20   Ht 6' (1.829 m)   Wt 108 kg   SpO2 98%   BMI 32.28 kg/m   Physical Exam  Constitutional: He appears well-developed and well-nourished. No distress.  Eyes: Conjunctivae are normal.  Neck: Neck supple.  Cardiovascular: Normal rate.   Pulmonary/Chest: No respiratory distress.  Abdominal: He exhibits no distension.  Musculoskeletal:  Obvious swelling and joint effusion to the right knee. Pain with any range of motion of the knee joint. Knee is warm to the touch. No erythema. Diffusely tender. Dorsal pedal pulses intact. Swelling and redness as well as tenderness of the right lateral foot. Normal ankle. Normal toes. Cap refill less than 2 seconds distally.  Skin: Skin is warm and dry.  Nursing note and vitals reviewed.    ED Treatments / Results  Labs (all labs ordered are listed, but only abnormal results are displayed) Labs Reviewed  GRAM STAIN  BODY FLUID CULTURE  ANAEROBIC CULTURE  SYNOVIAL CELL COUNT + DIFF, W/ CRYSTALS    EKG  EKG Interpretation None       Radiology No results found.  Procedures .Joint Aspiration/Arthrocentesis Date/Time: 02/19/2017 2:35 PM Performed by: Jaynie Crumble Authorized by: Jaynie Crumble   Consent:    Consent  obtained:  Verbal   Consent given by:  Patient   Alternatives discussed:  No treatment and observation Location:    Location:  Knee   Knee:  R knee Anesthesia (see MAR for exact dosages):    Anesthesia method:  Local infiltration   Local anesthetic:  Lidocaine 2% WITH epi Procedure details:    Preparation: Patient was prepped and draped in usual sterile fashion     Needle gauge:  18 G   Ultrasound guidance: no     Approach:  Lateral   Aspirate characteristics:  Yellow  Steroid injected: no     Specimen collected: yes   Post-procedure details:    Dressing:  Sterile dressing   Patient tolerance of procedure:  Tolerated well, no immediate complications Comments:     80mL of yellow fluid collected    (including critical care time)  Medications Ordered in ED Medications  lidocaine-EPINEPHrine (XYLOCAINE W/EPI) 2 %-1:100000 (with pres) injection 20 mL (not administered)  pentafluoroprop-tetrafluoroeth (GEBAUERS) aerosol (not administered)  HYDROcodone-acetaminophen (NORCO/VICODIN) 5-325 MG per tablet 1 tablet (1 tablet Oral Given 02/19/17 1331)  ondansetron (ZOFRAN-ODT) disintegrating tablet 8 mg (8 mg Oral Given 02/19/17 1331)  colchicine tablet 1.2 mg (1.2 mg Oral Given 02/19/17 1331)     Initial Impression / Assessment and Plan / ED Course  I have reviewed the triage vital signs and the nursing notes.  Pertinent labs & imaging results that were available during my care of the patient were reviewed by me and considered in my medical decision making (see chart for details).   pt in ED with right knee pain and swelling. Most likely gout, however given erythema to the knee, swelling, inability to move, will perform arthrocentesis to ro septic joint. vicodin ordered for pain, pt states he is unable to take oxycodone given nausea. Will also order a dose of colchicine and zofran for nausea.    3:44 PM Aspiration performed, pending results. Pt wanting To leave. I advised patient that  he is welcome to leave, however I do not have results of his joint aspiration and I would have to discharge him against medical advise. I did tell him that I will give him prescriptions for pain. Patient stated that he will wait a little longer. He expresses frustration for waiting so long for results. I will give him his second dose of colchicine here in ED.  Dr. Judd Lien to follow up on aspiration results.   Vitals:   02/19/17 1218 02/19/17 1219 02/19/17 1542  BP: (!) 164/108  (!) 161/89  Pulse: 94  82  Resp: 20  20  Temp: 98.2 F (36.8 C)    TempSrc: Oral    SpO2: 98%  98%  Weight:  108 kg   Height:  6' (1.829 m)      Final Clinical Impressions(s) / ED Diagnoses   Final diagnoses:  Effusion of right knee    New Prescriptions New Prescriptions   No medications on file     Jaynie Crumble, PA-C 02/20/17 1551    Melene Plan, DO 02/22/17 1459

## 2017-02-19 NOTE — ED Notes (Signed)
Pt and wife given d/c instructions as per chart. Rx x 3. Verbalize understanding. No questions.

## 2017-02-19 NOTE — Discharge Instructions (Signed)
Take percocet for pain. Take prednisone for inflammation as prescribed. Keep knee elevated. Ice several times a day. Follow up with your doctor.

## 2017-02-19 NOTE — ED Triage Notes (Signed)
Pt states right knee pain and swelling for over 1 week with progressive worsening.  Pt denies known injury.  States h/o gout.

## 2017-02-19 NOTE — ED Notes (Signed)
ED Provider at bedside. 

## 2017-02-21 NOTE — Telephone Encounter (Signed)
It it ok to refill pt Ambien

## 2017-02-24 LAB — CULTURE, BODY FLUID W GRAM STAIN -BOTTLE: Culture: NO GROWTH

## 2017-03-18 ENCOUNTER — Other Ambulatory Visit: Payer: Self-pay | Admitting: Cardiology

## 2017-06-16 ENCOUNTER — Other Ambulatory Visit: Payer: Self-pay | Admitting: Cardiology

## 2017-07-25 ENCOUNTER — Other Ambulatory Visit: Payer: Self-pay

## 2017-07-25 MED ORDER — FUROSEMIDE 40 MG PO TABS: 40.0000 mg | ORAL_TABLET | Freq: Every day | ORAL | 2 refills | Status: DC

## 2017-08-03 ENCOUNTER — Other Ambulatory Visit: Payer: Self-pay | Admitting: *Deleted

## 2017-08-03 MED ORDER — FUROSEMIDE 40 MG PO TABS: 40.0000 mg | ORAL_TABLET | Freq: Every day | ORAL | 2 refills | Status: DC

## 2017-10-12 ENCOUNTER — Encounter (HOSPITAL_BASED_OUTPATIENT_CLINIC_OR_DEPARTMENT_OTHER): Payer: Self-pay

## 2017-10-12 ENCOUNTER — Emergency Department (HOSPITAL_BASED_OUTPATIENT_CLINIC_OR_DEPARTMENT_OTHER)
Admission: EM | Admit: 2017-10-12 | Discharge: 2017-10-12 | Disposition: A | Discharge status: Home / Self Care | Payer: BLUE CROSS/BLUE SHIELD | Attending: Emergency Medicine | Admitting: Emergency Medicine

## 2017-10-12 ENCOUNTER — Other Ambulatory Visit: Payer: Self-pay

## 2017-10-12 DIAGNOSIS — Z7982 Long term (current) use of aspirin: Secondary | ICD-10-CM | POA: Insufficient documentation

## 2017-10-12 DIAGNOSIS — Z79899 Other long term (current) drug therapy: Secondary | ICD-10-CM | POA: Diagnosis not present

## 2017-10-12 DIAGNOSIS — M109 Gout, unspecified: Secondary | ICD-10-CM

## 2017-10-12 DIAGNOSIS — I509 Heart failure, unspecified: Secondary | ICD-10-CM | POA: Diagnosis not present

## 2017-10-12 DIAGNOSIS — M25532 Pain in left wrist: Secondary | ICD-10-CM | POA: Diagnosis present

## 2017-10-12 DIAGNOSIS — I110 Hypertensive heart disease with heart failure: Secondary | ICD-10-CM | POA: Insufficient documentation

## 2017-10-12 DIAGNOSIS — I2510 Atherosclerotic heart disease of native coronary artery without angina pectoris: Secondary | ICD-10-CM | POA: Insufficient documentation

## 2017-10-12 MED ORDER — PROMETHAZINE HCL 25 MG PO TABS: 25.0000 mg | ORAL_TABLET | Freq: Four times a day (QID) | ORAL | 0 refills | Status: DC | PRN

## 2017-10-12 MED ORDER — INDOMETHACIN 25 MG PO CAPS: 25.0000 mg | ORAL_CAPSULE | Freq: Three times a day (TID) | ORAL | 0 refills | Status: DC | PRN

## 2017-10-12 MED ORDER — OXYCODONE-ACETAMINOPHEN 5-325 MG PO TABS: 1.0000 | ORAL_TABLET | Freq: Four times a day (QID) | ORAL | 0 refills | Status: DC | PRN

## 2017-10-12 NOTE — ED Notes (Signed)
C/o pain and swelling to left hand x 4 days  Getting worse,  Started in little finger into other fingers, and and up wrist

## 2017-10-12 NOTE — ED Provider Notes (Signed)
[facility] EMERGENCY DEPARTMENT Provider Note   CSN: [id number] Arrival date & time: 10/12/17  1942     History   Chief Complaint Chief Complaint  Patient presents with  . Hand Pain    HPI WARNER BARLING is a 61 y.o. male.  Patient is a 61 year old male with history of hypertrophic cardiomyopathy presenting for evaluation of left wrist pain.  This is worsened over the past several days.  He has had gout in the past and this feels similar.  He denies any fevers or chills.   The history is provided by the patient.  Hand Pain  This is a new problem. The current episode started 2 days ago. The problem occurs constantly. The problem has been rapidly worsening. Exacerbated by: Movement and range of motion. Nothing relieves the symptoms.    Past Medical History:  Diagnosis Date  . CAD (coronary artery disease)    NONOBSTRUCTIVE  a. cath 8/10: prox to mid LAD 20%  . Chronic systolic heart failure (HCC)   . Essential hypertension, benign   . Gout   . Hypertrophic nonobstructive cardiomyopathy (HCC)    septal hypertrophy without LVOT  . NICM (nonischemic cardiomyopathy) (HCC)    a. echo 8/10: EF20-25%, mod LAE, severe asymmetric septal hypertrophy (? nonobs. HCM); grade 1 diast dyfxn.  EF 55% echo June 6    Patient Active Problem List   Diagnosis Date Noted  . Troponin level elevated 12/29/2016  . Chest pain with high risk for cardiac etiology 12/28/2016  . Chest pain 12/28/2016  . Left inguinal hernia 01/08/2014  . Insomnia 05/19/2012  . Hypertrophic nonobstructive cardiomyopathy (HCC) 05/04/2011  . Chronic systolic heart failure (HCC)   . Non Obstructive CAD by cath 06/2009   . OBESITY, UNSPECIFIED 10/14/2010  . History of NICM- resolved at last echo 08/20/2009  . Essential hypertension 01/26/2007  . GASTROINTESTINAL HEMORRHAGE 01/26/2007    Past Surgical History:  Procedure Laterality Date  . KNEE ARTHROSCOPY     right       Home Medications     Prior to Admission medications   Medication Sig Start Date End Date Taking? Authorizing Provider  acetaminophen (TYLENOL) 325 MG tablet Take 2 tablets (650 mg total) by mouth every 4 (four) hours as needed for headache or mild pain. 12/29/16   Abelino Derrick, PA-C  amLODipine (NORVASC) 10 MG tablet Take 1 tablet (10 mg total) by mouth daily. 02/17/17   Rollene Rotunda, MD  aspirin 81 MG tablet Take 81 mg by mouth daily.      [provider]  B Complex Vitamins (VITAMIN B COMPLEX PO) Take 1 tablet by mouth daily.      [provider]  carvedilol (COREG) 25 MG tablet Take 2 tablets (50 mg total) by mouth 2 (two) times daily with a meal. 02/17/17   Rollene Rotunda, MD  Cholecalciferol (VITAMIN D-3 PO) Take 1 tablet by mouth daily.    [provider]  CINNAMON PO Take 1 tablet by mouth daily.      [provider]  colchicine 0.6 MG tablet Take 2 tablets now, repeat 1 tablet 1 hour later. 01/07/16   Charm Rings, MD  Cyanocobalamin (VITAMIN B-12 PO) Take 1 tablet by mouth daily.      [provider]  enalapril (VASOTEC) 20 MG tablet TAKE 1 TABLET BY MOUTH TWICE DAILY 06/17/17   Rollene Rotunda, MD  furosemide (LASIX) 40 MG tablet Take 1 tablet (40 mg total) by mouth  daily. 08/03/17   Rollene RotundaHochrein, James, MD  Garlic TABS Take 1 tablet by mouth daily.      [provider]  ondansetron (ZOFRAN ODT) 8 MG disintegrating tablet Take 1 tablet (8 mg total) by mouth every 8 (eight) hours as needed for nausea or vomiting. 02/19/17   Kirichenko, Lemont Fillersatyana, PA-C  oxyCODONE-acetaminophen (PERCOCET) 5-325 MG tablet Take 1 tablet by mouth every 6 (six) hours as needed for severe pain. 02/19/17   Kirichenko, Lemont Fillersatyana, PA-C  predniSONE (DELTASONE) 10 MG tablet Take 2 tablets (20 mg total) by mouth daily. 02/19/17   Kirichenko, Lemont Fillersatyana, PA-C  traMADol (ULTRAM) 50 MG tablet Take 1 tablet (50 mg total) by mouth every 6 (six) hours as needed for severe pain. 01/07/16   Charm RingsHonig, Erin J, MD   zolpidem (AMBIEN) 10 MG tablet Take 1 tablet (10 mg total) by mouth at bedtime as needed for sleep. 10/02/14 02/19/17  Rollene RotundaHochrein, James, MD    Family History Family History  Problem Relation Age of Onset  . Kidney disease Brother   . Cancer Brother        prostate  . Kidney disease Father   . Diabetes Mother   . Heart attack Mother 778  . Heart disease Mother   . Cancer Sister        breast  . CAD Brother 1371       60% blockage    Social History Social History   Tobacco Use  . Smoking status: Never Smoker  . Smokeless tobacco: Never Used  Substance Use Topics  . Alcohol use: No    Alcohol/week: 0.0 oz  . Drug use: No     Allergies   Patient has no known allergies.   Review of Systems Review of Systems  All other systems reviewed and are negative.    Physical Exam Updated Vital Signs BP (!) 152/101 (BP Location: Right Arm)   Pulse 80   Temp 98.7 F (37.1 C) (Oral)   Resp 18   Ht 6' (1.829 m)   Wt 106.6 kg (235 lb)   SpO2 99%   BMI 31.87 kg/m   Physical Exam  Constitutional: He is oriented to person, place, and time. He appears well-developed and well-nourished. No distress.  HENT:  Head: Normocephalic and atraumatic.  Neck: Normal range of motion. Neck supple.  Pulmonary/Chest: Effort normal.  Musculoskeletal:  The left wrist is noted to have swelling, warmth, and pain with any range of motion.  Neurological: He is alert and oriented to person, place, and time.  Skin: Skin is warm and dry. He is not diaphoretic.  Nursing note and vitals reviewed.    ED Treatments / Results  Labs (all labs ordered are listed, but only abnormal results are displayed) Labs Reviewed - No data to display  EKG  EKG Interpretation None       Radiology No results found.  Procedures Procedures (including critical care time)  Medications Ordered in ED Medications - No data to display   Initial Impression / Assessment and Plan / ED Course  I have reviewed  the triage vital signs and the nursing notes.  Pertinent labs & imaging results that were available during my care of the patient were reviewed by me and considered in my medical decision making (see chart for details).  This appears to be gout. Will treat with indomethacin, pain meds, nausea meds (as he has this response to pain medicine).  He is to follow-up with primary doctor if not improving.  Final Clinical Impressions(s) / ED Diagnoses   Final diagnoses:  None    ED Discharge Orders    None       Geoffery Lyons, MD 10/12/17 2125

## 2017-10-12 NOTE — ED Notes (Signed)
Pt refused to get in a gown and stated it was unnecessary since it was just his hand.

## 2017-10-12 NOTE — Discharge Instructions (Signed)
Indomethacin as prescribed.  Percocet as needed for pain.  Phenergan as prescribed as needed for pain.  Follow-up with your primary doctor if not improving in the next 2-3 days, and return to the ER if symptoms significantly worsen or change.

## 2017-10-12 NOTE — ED Triage Notes (Signed)
Pt c/o pain and swelling to left hand, believes it is a gout flare up

## 2018-01-04 ENCOUNTER — Other Ambulatory Visit: Payer: Self-pay

## 2018-01-04 ENCOUNTER — Emergency Department (HOSPITAL_BASED_OUTPATIENT_CLINIC_OR_DEPARTMENT_OTHER)
Admission: EM | Admit: 2018-01-04 | Discharge: 2018-01-04 | Disposition: A | Discharge status: Home / Self Care | Payer: BLUE CROSS/BLUE SHIELD | Attending: Physician Assistant | Admitting: Physician Assistant

## 2018-01-04 ENCOUNTER — Encounter (HOSPITAL_BASED_OUTPATIENT_CLINIC_OR_DEPARTMENT_OTHER): Payer: Self-pay

## 2018-01-04 DIAGNOSIS — M1009 Idiopathic gout, multiple sites: Secondary | ICD-10-CM | POA: Insufficient documentation

## 2018-01-04 DIAGNOSIS — M25522 Pain in left elbow: Secondary | ICD-10-CM | POA: Diagnosis present

## 2018-01-04 DIAGNOSIS — Z79899 Other long term (current) drug therapy: Secondary | ICD-10-CM | POA: Insufficient documentation

## 2018-01-04 DIAGNOSIS — I2510 Atherosclerotic heart disease of native coronary artery without angina pectoris: Secondary | ICD-10-CM | POA: Insufficient documentation

## 2018-01-04 DIAGNOSIS — M109 Gout, unspecified: Secondary | ICD-10-CM

## 2018-01-04 DIAGNOSIS — I5022 Chronic systolic (congestive) heart failure: Secondary | ICD-10-CM | POA: Diagnosis not present

## 2018-01-04 DIAGNOSIS — I110 Hypertensive heart disease with heart failure: Secondary | ICD-10-CM | POA: Insufficient documentation

## 2018-01-04 DIAGNOSIS — Z7982 Long term (current) use of aspirin: Secondary | ICD-10-CM | POA: Diagnosis not present

## 2018-01-04 MED ORDER — OXYCODONE-ACETAMINOPHEN 5-325 MG PO TABS
1.0000 | ORAL_TABLET | Freq: Four times a day (QID) | ORAL | 0 refills | Status: DC | PRN
Start: 2018-01-04 — End: 2018-08-07

## 2018-01-04 MED ORDER — PREDNISONE 20 MG PO TABS: ORAL_TABLET | ORAL | 0 refills | Status: DC

## 2018-01-04 MED ORDER — PREDNISONE 50 MG PO TABS
60.0000 mg | ORAL_TABLET | Freq: Once | ORAL | Status: AC
  Administered 2018-01-04: 60 mg via ORAL
  Filled 2018-01-04: qty 1

## 2018-01-04 MED ORDER — ONDANSETRON 4 MG PO TBDP: 4.0000 mg | ORAL_TABLET | Freq: Three times a day (TID) | ORAL | 0 refills | Status: DC | PRN

## 2018-01-04 NOTE — ED Provider Notes (Signed)
[facility] EMERGENCY DEPARTMENT Provider Note   CSN: [id number] Arrival date & time: 01/04/18  1932     History   Chief Complaint Chief Complaint  Patient presents with  . Gout    HPI Craig Prince is a 62 y.o. male.  Patient with history of cardiomyopathy, nonobstructive coronary artery disease, intermittent gout flares presents with left elbow, left wrist, and left finger pain consistent with previous episodes of gout.  Patient has taken some anti-inflammatories at home without relief.  Symptoms began 4 days ago as a stiff feeling and gradually worsened.  He has some mild joint swelling which is most impressive at the left fifth PIP joint.  Patient denies fevers, nausea or vomiting.  No history of septic arthritis.  In the past he has had good results with prednisone and pain medication.  Patient denies injury to any of these areas.  Onset of symptoms acute.  Course is gradually worsening.  Movement makes the pain worse.  Nothing makes it better.      Past Medical History:  Diagnosis Date  . CAD (coronary artery disease)    NONOBSTRUCTIVE  a. cath 8/10: prox to mid LAD 20%  . Chronic systolic heart failure (HCC)   . Essential hypertension, benign   . Gout   . Hypertrophic nonobstructive cardiomyopathy (HCC)    septal hypertrophy without LVOT  . NICM (nonischemic cardiomyopathy) (HCC)    a. echo 8/10: EF20-25%, mod LAE, severe asymmetric septal hypertrophy (? nonobs. HCM); grade 1 diast dyfxn.  EF 55% echo June 6    Patient Active Problem List   Diagnosis Date Noted  . Troponin level elevated 12/29/2016  . Chest pain with high risk for cardiac etiology 12/28/2016  . Chest pain 12/28/2016  . Left inguinal hernia 01/08/2014  . Insomnia 05/19/2012  . Hypertrophic nonobstructive cardiomyopathy (HCC) 05/04/2011  . Chronic systolic heart failure (HCC)   . Non Obstructive CAD by cath 06/2009   . OBESITY, UNSPECIFIED 10/14/2010  . History of NICM- resolved at last  echo 08/20/2009  . Essential hypertension 01/26/2007  . GASTROINTESTINAL HEMORRHAGE 01/26/2007    Past Surgical History:  Procedure Laterality Date  . KNEE ARTHROSCOPY     right       Home Medications    Prior to Admission medications   Medication Sig Start Date End Date Taking? Authorizing Provider  acetaminophen (TYLENOL) 325 MG tablet Take 2 tablets (650 mg total) by mouth every 4 (four) hours as needed for headache or mild pain. 12/29/16   Abelino Derrick, PA-C  amLODipine (NORVASC) 10 MG tablet Take 1 tablet (10 mg total) by mouth daily. 02/17/17   Rollene Rotunda, MD  aspirin 81 MG tablet Take 81 mg by mouth daily.      [provider]  B Complex Vitamins (VITAMIN B COMPLEX PO) Take 1 tablet by mouth daily.      [provider]  carvedilol (COREG) 25 MG tablet Take 2 tablets (50 mg total) by mouth 2 (two) times daily with a meal. 02/17/17   Rollene Rotunda, MD  Cholecalciferol (VITAMIN D-3 PO) Take 1 tablet by mouth daily.    [provider]  CINNAMON PO Take 1 tablet by mouth daily.      [provider]  colchicine 0.6 MG tablet Take 2 tablets now, repeat 1 tablet 1 hour later. 01/07/16   Charm Rings, MD  Cyanocobalamin (VITAMIN B-12 PO) Take 1 tablet by mouth daily.      [provider]  enalapril (VASOTEC) 20 MG tablet TAKE 1 TABLET BY MOUTH TWICE DAILY 06/17/17   Rollene Rotunda, MD  furosemide (LASIX) 40 MG tablet Take 1 tablet (40 mg total) by mouth daily. 08/03/17   Rollene Rotunda, MD  Garlic TABS Take 1 tablet by mouth daily.      [provider]  indomethacin (INDOCIN) 25 MG capsule Take 1 capsule (25 mg total) 3 (three) times daily as needed by mouth. 10/12/17   Geoffery Lyons, MD  ondansetron (ZOFRAN ODT) 4 MG disintegrating tablet Take 1 tablet (4 mg total) by mouth every 8 (eight) hours as needed for nausea or vomiting. 01/04/18   Renne Crigler, PA-C  oxyCODONE-acetaminophen (PERCOCET/ROXICET) 5-325 MG tablet Take 1-2  tablets by mouth every 6 (six) hours as needed for severe pain. 01/04/18   Renne Crigler, PA-C  predniSONE (DELTASONE) 20 MG tablet 3 Tabs PO Days 1-3, then 2 tabs PO Days 4-6, then 1 tab PO Day 7-9, then Half Tab PO Day 10-12 01/04/18   Renne Crigler, PA-C  traMADol (ULTRAM) 50 MG tablet Take 1 tablet (50 mg total) by mouth every 6 (six) hours as needed for severe pain. 01/07/16   Charm Rings, MD  zolpidem (AMBIEN) 10 MG tablet Take 1 tablet (10 mg total) by mouth at bedtime as needed for sleep. 10/02/14 02/19/17  Rollene Rotunda, MD    Family History Family History  Problem Relation Age of Onset  . Kidney disease Brother   . Cancer Brother        prostate  . Kidney disease Father   . Diabetes Mother   . Heart attack Mother 78  . Heart disease Mother   . Cancer Sister        breast  . CAD Brother 34       60% blockage    Social History Social History   Tobacco Use  . Smoking status: Never Smoker  . Smokeless tobacco: Never Used  Substance Use Topics  . Alcohol use: No    Alcohol/week: 0.0 oz  . Drug use: No     Allergies   Patient has no known allergies.   Review of Systems Review of Systems  Constitutional: Negative for activity change and fever.  Gastrointestinal: Negative for nausea and vomiting.  Musculoskeletal: Positive for arthralgias and joint swelling. Negative for back pain, gait problem and neck pain.  Skin: Negative for wound.  Neurological: Negative for weakness and numbness.     Physical Exam Updated Vital Signs BP (!) 165/129 (BP Location: Right Arm)   Pulse (!) 102   Temp 98.7 F (37.1 C) (Oral)   Resp 16   Ht 6' (1.829 m)   Wt 107.7 kg (237 lb 8 oz)   SpO2 97%   BMI 32.21 kg/m   Physical Exam  Constitutional: He appears well-developed and well-nourished.  HENT:  Head: Normocephalic and atraumatic.  Eyes: Conjunctivae are normal.  Neck: Normal range of motion. Neck supple.  Cardiovascular: Normal pulses. Exam reveals no decreased  pulses.  Musculoskeletal: He exhibits tenderness. He exhibits no edema.       Left shoulder: Normal. He exhibits normal range of motion, no tenderness and no bony tenderness.       Left elbow: He exhibits decreased range of motion and swelling (Mild).       Left wrist: He exhibits decreased range of motion and tenderness. He exhibits no bony tenderness.       Left forearm: Normal. He exhibits no tenderness,  no bony tenderness and no swelling.       Left hand: He exhibits decreased range of motion, tenderness and swelling.       Hands: Patient with slightly decreased range of motion of left elbow with mild pain.  No effusion noted.  No overlying erythema noted.  Patient with slightly decreased range of motion of left wrist with mild pain.  No effusion noted.  No overlying erythema noted.  Mild generalized swelling over the dorsum of the wrist.  Patient with generalized swelling of the fingers.  Left 5th PIP joint is moderately swollen with moderately decreased range of motion with flexion.  No streaking or overlying cellulitic changes.  Otherwise good range of motion and fingers.  Neurological: He is alert. No sensory deficit.  Motor, sensation, and vascular distal to the injury is fully intact.   Skin: Skin is warm and dry.  Psychiatric: He has a normal mood and affect.  Nursing note and vitals reviewed.    ED Treatments / Results   Procedures Procedures (including critical care time)  Medications Ordered in ED Medications  predniSONE (DELTASONE) tablet 60 mg (60 mg Oral Given 01/04/18 2211)     Initial Impression / Assessment and Plan / ED Course  I have reviewed the triage vital signs and the nursing notes.  Pertinent labs & imaging results that were available during my care of the patient were reviewed by me and considered in my medical decision making (see chart for details).     Patient seen and examined.  Patient with previously known history of gout.  Last ED visit was 2-3  months ago.  Symptoms, per patient, are similar to previous and he has had similar flares in these joints in the past.  He does not have any symptomatic symptoms of illness including fever, nausea or vomiting.  No trauma to these areas.  Vital signs reviewed and are as follows: BP (!) 165/129 (BP Location: Right Arm)   Pulse (!) 102   Temp 98.7 F (37.1 C) (Oral)   Resp 16   Ht 6' (1.829 m)   Wt 107.7 kg (237 lb 8 oz)   SpO2 97%   BMI 32.21 kg/m   Will place patient on prednisone and give short course of Percocet and Zofran.  Will avoid NSAIDs given cardiac history.  Patient encouraged to follow-up with his PCP for recheck in the next 3 days.  We discussed signs and symptoms of infection including increasing redness, inability to stand or arm, fevers.  Patient seems reliable to return with worsening.  Final Clinical Impressions(s) / ED Diagnoses   Final diagnoses:  Gouty arthritis   Patient with signs and symptoms consistent with gouty arthritis given his history.  No joints amenable to arthrocentesis at this time and there are no systemic symptoms of infection.  I have very low concern for septic arthritis tonight given his history.  Patient appears well, nontoxic.  Will treat symptoms as above.  ED Discharge Orders        Ordered    oxyCODONE-acetaminophen (PERCOCET/ROXICET) 5-325 MG tablet  Every 6 hours PRN     01/04/18 2207    predniSONE (DELTASONE) 20 MG tablet     01/04/18 2207    ondansetron (ZOFRAN ODT) 4 MG disintegrating tablet  Every 8 hours PRN     01/04/18 2207       Renne Crigler, PA-C 01/04/18 2322    Mackuen, Cindee Salt, MD 01/05/18 1440

## 2018-01-04 NOTE — Discharge Instructions (Signed)
Please read and follow all provided instructions.  Your diagnoses today include:  1. Gouty arthritis    Tests performed today include:  Vital signs. See below for your results today.   Medications prescribed:   Prednisone - steroid medicine   It is best to take this medication in the morning to prevent sleeping problems. If you are diabetic, monitor your blood sugar closely and stop taking Prednisone if blood sugar is over 300. Take with food to prevent stomach upset.    Zofran (ondansetron) - for nausea and vomiting   Percocet (oxycodone/acetaminophen) - narcotic pain medication  DO NOT drive or perform any activities that require you to be awake and alert because this medicine can make you drowsy. BE VERY CAREFUL not to take multiple medicines containing Tylenol (also called acetaminophen). Doing so can lead to an overdose which can damage your liver and cause liver failure and possibly death.  Take any prescribed medications only as directed.  Home care instructions:   Follow any educational materials contained in this packet  Follow-up instructions: Please follow-up with your primary care provider in 3 days if symptoms are not improving. In this case you may have a more severe injury that requires further care.   Return instructions:   Please return to the Emergency Department if you experience worsening symptoms.   Return with worsening or expanding redness, swelling, fevers.  Please return if you have any other emergent concerns.  Additional Information:  Your vital signs today were: BP (!) 165/129 (BP Location: Right Arm)    Pulse (!) 102    Temp 98.7 F (37.1 C) (Oral)    Resp 16    Ht 6' (1.829 m)    Wt 107.7 kg (237 lb 8 oz)    SpO2 97%    BMI 32.21 kg/m  If your blood pressure (BP) was elevated above 135/85 this visit, please have this repeated by your doctor within one month. --------------

## 2018-01-04 NOTE — ED Triage Notes (Signed)
C/o "gout flare up"-left hand and left elbow-NAD-steady gait

## 2018-01-05 ENCOUNTER — Other Ambulatory Visit: Payer: Self-pay | Admitting: Cardiology

## 2018-01-05 NOTE — Telephone Encounter (Signed)
REFILL 

## 2018-03-06 ENCOUNTER — Other Ambulatory Visit: Payer: Self-pay | Admitting: *Deleted

## 2018-03-06 MED ORDER — AMLODIPINE BESYLATE 10 MG PO TABS: 10.0000 mg | ORAL_TABLET | Freq: Every day | ORAL | 0 refills | Status: DC

## 2018-03-06 NOTE — Telephone Encounter (Signed)
Rx has been sent to the pharmacy electronically. ° °

## 2018-04-06 ENCOUNTER — Other Ambulatory Visit: Payer: Self-pay | Admitting: Cardiology

## 2018-04-07 ENCOUNTER — Other Ambulatory Visit: Payer: Self-pay | Admitting: *Deleted

## 2018-04-07 ENCOUNTER — Other Ambulatory Visit: Payer: Self-pay | Admitting: Cardiology

## 2018-04-07 MED ORDER — FUROSEMIDE 40 MG PO TABS: 40.0000 mg | ORAL_TABLET | Freq: Every day | ORAL | 0 refills | Status: DC

## 2018-04-07 MED ORDER — CARVEDILOL 25 MG PO TABS: 50.0000 mg | ORAL_TABLET | Freq: Two times a day (BID) | ORAL | 0 refills | Status: DC

## 2018-04-07 NOTE — Telephone Encounter (Signed)
Rx(s) sent to pharmacy electronically.  

## 2018-04-07 NOTE — Telephone Encounter (Signed)
REFILL 

## 2018-05-13 ENCOUNTER — Other Ambulatory Visit: Payer: Self-pay | Admitting: Internal Medicine

## 2018-05-14 ENCOUNTER — Other Ambulatory Visit: Payer: Self-pay | Admitting: Cardiology

## 2018-05-15 ENCOUNTER — Other Ambulatory Visit: Payer: Self-pay | Admitting: Internal Medicine

## 2018-05-15 DIAGNOSIS — R109 Unspecified abdominal pain: Secondary | ICD-10-CM

## 2018-07-10 NOTE — Progress Notes (Deleted)
HPI The patient presents for followup of his hypertension and cardiomyopathy. He has a nonischemic cardiomyopathy with an original ejection fraction of 25% to 50% on followup in 2012 and 14.  He does have some septal hypertrophy. I sent him for an exercise treadmill test. It was reported that he dropped his blood pressure. He was able to go for about 9 minutes and 30 seconds without symptoms however. I was going to get a dobutamine echocardiogram to look for high risk findings and inducible gradient. However, he canceled this appointment.  He came into the hospital with chest pain and ruled out. He subsequently had a CT coronary angiogram with normal coronaries and zero calcium score.    ***   Since that time he's done well. He's not been active yet but is going to get back to doing some activity.  The patient denies any new symptoms such as chest discomfort, neck or arm discomfort. There has been no new shortness of breath, PND or orthopnea. There have been no reported palpitations, presyncope or syncope.   No Known Allergies  Current Outpatient Medications  Medication Sig Dispense Refill  . acetaminophen (TYLENOL) 325 MG tablet Take 2 tablets (650 mg total) by mouth every 4 (four) hours as needed for headache or mild pain.    Marland Kitchen amLODipine (NORVASC) 10 MG tablet TAKE 1 TABLET(10 MG) BY MOUTH DAILY 30 tablet 0  . amLODipine (NORVASC) 10 MG tablet TAKE 1 TABLET(10 MG) BY MOUTH DAILY 30 tablet 0  . amLODipine (NORVASC) 10 MG tablet TAKE 1 TABLET( 10 MG TOTAL) BY MOUTH DAILY 30 tablet 1  . aspirin 81 MG tablet Take 81 mg by mouth daily.      . B Complex Vitamins (VITAMIN B COMPLEX PO) Take 1 tablet by mouth daily.      . carvedilol (COREG) 25 MG tablet Take 2 tablets (50 mg total) by mouth 2 (two) times daily with a meal. 360 tablet 0  . Cholecalciferol (VITAMIN D-3 PO) Take 1 tablet by mouth daily.    Marland Kitchen CINNAMON PO Take 1 tablet by mouth daily.      . colchicine 0.6 MG tablet Take 2 tablets  now, repeat 1 tablet 1 hour later. 3 tablet 0  . Cyanocobalamin (VITAMIN B-12 PO) Take 1 tablet by mouth daily.      . enalapril (VASOTEC) 20 MG tablet TAKE 1 TABLET BY MOUTH TWICE DAILY 60 tablet 0  . furosemide (LASIX) 40 MG tablet Take 1 tablet (40 mg total) by mouth daily. 90 tablet 0  . Garlic TABS Take 1 tablet by mouth daily.      . indomethacin (INDOCIN) 25 MG capsule Take 1 capsule (25 mg total) 3 (three) times daily as needed by mouth. 20 capsule 0  . ondansetron (ZOFRAN ODT) 4 MG disintegrating tablet Take 1 tablet (4 mg total) by mouth every 8 (eight) hours as needed for nausea or vomiting. 10 tablet 0  . oxyCODONE-acetaminophen (PERCOCET/ROXICET) 5-325 MG tablet Take 1-2 tablets by mouth every 6 (six) hours as needed for severe pain. 10 tablet 0  . predniSONE (DELTASONE) 20 MG tablet 3 Tabs PO Days 1-3, then 2 tabs PO Days 4-6, then 1 tab PO Day 7-9, then Half Tab PO Day 10-12 20 tablet 0  . traMADol (ULTRAM) 50 MG tablet Take 1 tablet (50 mg total) by mouth every 6 (six) hours as needed for severe pain. 10 tablet 0  . zolpidem (AMBIEN) 10 MG tablet Take 1 tablet (10 mg  total) by mouth at bedtime as needed for sleep. 30 tablet 3   No current facility-administered medications for this visit.     Past Medical History:  Diagnosis Date  . CAD (coronary artery disease)    NONOBSTRUCTIVE  a. cath 8/10: prox to mid LAD 20%  . Chronic systolic heart failure (HCC)   . Essential hypertension, benign   . Gout   . Hypertrophic nonobstructive cardiomyopathy (HCC)    septal hypertrophy without LVOT  . NICM (nonischemic cardiomyopathy) (HCC)    a. echo 8/10: EF20-25%, mod LAE, severe asymmetric septal hypertrophy (? nonobs. HCM); grade 1 diast dyfxn.  EF 55% echo June 6    Past Surgical History:  Procedure Laterality Date  . KNEE ARTHROSCOPY     right    ROS:   ***  PHYSICAL EXAM There were no vitals taken for this visit.  GENERAL:  Well appearing NECK:  No jugular venous  distention, waveform within normal limits, carotid upstroke brisk and symmetric, no bruits, no thyromegaly LUNGS:  Clear to auscultation bilaterally CHEST:  Unremarkable HEART:  PMI not displaced or sustained,S1 and S2 within normal limits, no S3, no S4, no clicks, no rubs, *** murmurs ABD:  Flat, positive bowel sounds normal in frequency in pitch, no bruits, no rebound, no guarding, no midline pulsatile mass, no hepatomegaly, no splenomegaly EXT:  2 plus pulses throughout, no edema, no cyanosis no clubbing   ***GENERAL:  Well appearing HEENT:  Pupils equal round and reactive, fundi not visualized, oral mucosa unremarkable NECK:  No jugular venous distention, waveform within normal limits, carotid upstroke brisk and symmetric, no bruits, no thyromegaly LYMPHATICS:  No cervical, inguinal adenopathy LUNGS:  Clear to auscultation bilaterally BACK:  No CVA tenderness CHEST:  Unremarkable HEART:  PMI not displaced or sustained,S1 and S2 within normal limits, no S3, no S4, no clicks, no rubs, no murmurs ABD:  Flat, positive bowel sounds normal in frequency in pitch, no bruits, no rebound, no guarding, no midline pulsatile mass, no hepatomegaly, no splenomegaly EXT:  2 plus pulses throughout, no edema, no cyanosis no clubbing   ASSESSMENT AND PLAN  NICM:  He does have some septal hypertrophy.  ***  However, this was 25 mm. He has no other high-risk findings. His EF was better on his previous echo. At this point I'll follow with an echo when I see him next year.  HTN:  His blood pressure is *** at the upper limits of acceptable. He will stay on the meds as listed.   CHEST PAIN:  He had a calcium score of zero and normal coronaries.  In 2018.  ***   No change in therapy is indicated.  No further imaging is indicated.

## 2018-07-12 ENCOUNTER — Ambulatory Visit: Payer: BLUE CROSS/BLUE SHIELD | Admitting: Cardiology

## 2018-07-14 ENCOUNTER — Encounter: Payer: Self-pay | Admitting: Genetic Counselor

## 2018-07-17 ENCOUNTER — Inpatient Hospital Stay: Payer: Self-pay | Attending: Genetic Counselor | Admitting: Genetic Counselor

## 2018-07-17 ENCOUNTER — Inpatient Hospital Stay: Payer: Self-pay

## 2018-07-17 DIAGNOSIS — Z8042 Family history of malignant neoplasm of prostate: Secondary | ICD-10-CM

## 2018-07-17 DIAGNOSIS — Z803 Family history of malignant neoplasm of breast: Secondary | ICD-10-CM

## 2018-07-17 DIAGNOSIS — Z315 Encounter for genetic counseling: Secondary | ICD-10-CM

## 2018-07-17 DIAGNOSIS — Z1501 Genetic susceptibility to malignant neoplasm of breast: Secondary | ICD-10-CM

## 2018-07-17 NOTE — Progress Notes (Signed)
REFERRING PROVIDER: Merrilee Seashore, MD [address] [place], [place] [address]  PRIMARY PROVIDER:  Merrilee Seashore, MD  PRIMARY REASON FOR VISIT:  1. Family history of prostate cancer   2. Family history of breast cancer      HISTORY OF PRESENT ILLNESS:   Craig Prince, a 62 y.o. male, was seen for a [facility] cancer genetics consultation at the request of Dr. Ashby Dawes due to a family history of cancer.  Craig Prince presents to clinic today to discuss the possibility of a hereditary predisposition to cancer, genetic testing, and to further clarify his future cancer risks, as well as potential cancer risks for family members.   Craig Prince is a 62 y.o. male with no personal history of cancer.  A sister was diagnosed with breast cancer in May and underwent genetic testing.  She was found to have a PALB2 mutation, and cascade genetic testing of her family was recommended.  Craig Prince is here to learn more about, and undergo, genetic testing to determine whether he is also a carrier of this mutation.  CANCER HISTORY:   No history exists.     RISK FACTORS:  Prostate Cancer Screening: Yes Dermatology Screening: No Colonoscopy: no; not examined. Any excessive radiation exposure in the past:  no  Past Medical History:  Diagnosis Date  . CAD (coronary artery disease)    NONOBSTRUCTIVE  a. cath 8/10: prox to mid LAD 20%  . Chronic systolic heart failure ([facility])   . Essential hypertension, benign   . Family history of breast cancer   . Family history of prostate cancer   . Gout   . Hypertrophic nonobstructive cardiomyopathy ([facility] [place])    septal hypertrophy without LVOT  . NICM (nonischemic cardiomyopathy) ([facility])    a. echo 8/10: EF20-25%, mod LAE, severe asymmetric septal hypertrophy (? nonobs. HCM); grade 1 diast dyfxn.  EF 55% echo June 6    Past Surgical History:  Procedure Laterality Date  . KNEE ARTHROSCOPY     right    Social History   Socioeconomic History   . Marital status: Legally Separated    Spouse name: Not on file  . Number of children: Not on file  . Years of education: Not on file  . Highest education level: Not on file  Occupational History  . Not on file  Social Needs  . Financial resource strain: Not on file  . Food insecurity:    Worry: Not on file    Inability: Not on file  . Transportation needs:    Medical: Not on file    Non-medical: Not on file  Tobacco Use  . Smoking status: Never Smoker  . Smokeless tobacco: Never Used  Substance and Sexual Activity  . Alcohol use: No    Alcohol/week: 0.0 standard drinks  . Drug use: No  . Sexual activity: Not on file  Lifestyle  . Physical activity:    Days per week: Not on file    Minutes per session: Not on file  . Stress: Not on file  Relationships  . Social connections:    Talks on phone: Not on file    Gets together: Not on file    Attends religious service: Not on file    Active member of club or organization: Not on file    Attends meetings of clubs or organizations: Not on file    Relationship status: Not on file  Other Topics Concern  . Not on file  Social History Narrative  .  Not on file     FAMILY HISTORY:  We obtained a detailed, 4-generation family history.  Significant diagnoses are listed below: Family History  Problem Relation Age of Onset  . Kidney disease Brother   . Cancer Brother        prostate  . Kidney disease Father   . Diabetes Mother   . Heart attack Mother 35  . Heart disease Mother   . Cancer Sister        breast  . CAD Brother 47       60% blockage  . Breast cancer Sister 27  . Breast cancer Sister 62       PALB2 pos  . Healthy Son   . Healthy Daughter     The patient has four sons and three daughters, and eleven grandchildren who are all cancer free.  He has 13 siblings.  One brother was diagnosed with prostate cancer, and three sisters were diagnosed with breast cancer.  A known PALB2 mutation was identified in one of  his sisters.  Both parents are deceased.   The patient's mother died at 16 from heart disease.  She had 6-7 siblings, none had cancer.  Her parents died from non-cancer related issues.  The patient's father died of renal disease and bone cancer at 62.  He had three sisters and two brothers.  One sister had an unknown form of cancer.  The paternal grandparents are both deceased from non-cancer related issues.  Craig Prince is aware of several family members having  genetic testing for hereditary cancer risks. Patient's maternal ancestors are of African American descent, and paternal ancestors are of African American descent. There is no reported Ashkenazi Jewish ancestry. There is no known consanguinity.  GENETIC COUNSELING ASSESSMENT: Craig Prince is a 62 y.o. male with a family history of breast and prostate cancer and a known family mutation in [place] which is suggestive of a hereditary cancer syndrome and predisposition to cancer. We, therefore, discussed and recommended the following at today's visit.   DISCUSSION: We discussed that his sister was found to have a PALB2 mutation which was causative of her breast cancer.  PALB2 is a moderate risk gene for breast cancer in women, but it will also increase the risk for prostate cancer and male breast cancer, and is suggestive of an increased risk for pancreatic cancer.  PALB2 is inherited in an autosomal dominant fashion and therefore Mr. Arp has a 50% chance of testing positive.  If he tests positive we would recommend that is children also get tested.  IF he is negative, then his children would not be at risk for the PALB2 mutation in his family.    We reviewed the characteristics, features and inheritance patterns of hereditary cancer syndromes. We also discussed genetic testing, including the appropriate family members to test, the process of testing, insurance coverage and turn-around-time for results. We discussed the implications of a negative,  positive and/or variant of uncertain significant result. We recommended Craig Prince pursue genetic testing for the PALB2 gene.   [facility], the testing laboratory Craig Prince sister had testing through, offers complementary testing for the gene mutation for all family members if performed within 38 days.  Craig Prince test falls within this time frame.  He should not be billed for the genetic test.  PLAN: After considering the risks, benefits, and limitations, Craig Prince  provided informed consent to pursue genetic testing and the blood sample was sent to [facility] for analysis  of the PALB2 familial mutation. Results should be available within approximately 2-3 weeks' time, at which point they will be disclosed by telephone to Craig Prince, as will any additional recommendations warranted by these results. Craig Prince will receive a summary of his genetic counseling visit and a copy of his results once available. This information will also be available in Epic. We encouraged Craig Prince to remain in contact with cancer genetics annually so that we can continuously update the family history and inform him of any changes in cancer genetics and testing that may be of benefit for his family. Craig Prince questions were answered to his satisfaction today. Our contact information was provided should additional questions or concerns arise.  Lastly, we encouraged Craig Prince to remain in contact with cancer genetics annually so that we can continuously update the family history and inform him of any changes in cancer genetics and testing that may be of benefit for this family.   Mr.  Prince questions were answered to his satisfaction today. Our contact information was provided should additional questions or concerns arise. Thank you for the referral and allowing [place] to share in the care of your patient.   Dalisa Forrer P. Florene Glen, [place], [facility] Certified Genetic Counselor [email or web address] phone: [phone]  The patient  was seen for a total of 30 minutes in face-to-face genetic counseling.  This patient was discussed with Drs. Magrinat, Lindi Adie and/or Burr Medico who agrees with the above.    _______________________________________________________________________ For Office Staff:  Number of people involved in session: 1 Was an Intern/ student involved with case: no

## 2018-07-25 ENCOUNTER — Other Ambulatory Visit: Payer: Self-pay | Admitting: Cardiology

## 2018-07-27 ENCOUNTER — Telehealth: Payer: Self-pay | Admitting: Genetic Counselor

## 2018-07-27 ENCOUNTER — Encounter: Payer: Self-pay | Admitting: Genetic Counselor

## 2018-07-27 DIAGNOSIS — Z1379 Encounter for other screening for genetic and chromosomal anomalies: Secondary | ICD-10-CM | POA: Insufficient documentation

## 2018-07-27 NOTE — Telephone Encounter (Signed)
Revealed that he tested positive for the PALB2 mutation that was found in his sister as well as the CDH1 VUS that was also identified.  Explained that this gene increases the risk for male breast cancer mildly and also for prostate cancer.  Explained that most men do not get cancer from this gene, but they can pass it on to their children.  Explained that his children need to think about undergoing genetic testing so that they can learn about their cancer risk.  The 90 day free testing from his sister has expired, but if we start testing his children and one tests positive, we could possibly start that process again.    The patient voiced his understanding.

## 2018-07-28 ENCOUNTER — Encounter: Payer: Self-pay | Admitting: Genetic Counselor

## 2018-08-05 NOTE — Progress Notes (Signed)
HPI The patient presents for followup of his hypertension and cardiomyopathy. He has a nonischemic cardiomyopathy with an original ejection fraction of 25% to 50% on followup in 2012 and 14.  He does have some septal hypertrophy. I sent him for an exercise treadmill test. It was reported that he dropped his blood pressure. He was able to go for about 9 minutes and 30 seconds without symptoms however. I was going to get a dobutamine echocardiogram to look for high risk findings and inducible gradient. However, he canceled this appointment.  He came into the hospital with chest pain and ruled out. He subsequently had a CT coronary angiogram with normal coronaries and zero calcium score.      Since I last saw him he has done well. The patient denies any new symptoms such as chest discomfort, neck or arm discomfort. There has been no new shortness of breath, PND or orthopnea. There have been no reported palpitations, presyncope or syncope.  He does not exercise routinely but he feels well.    No Known Allergies  Current Outpatient Medications  Medication Sig Dispense Refill  . acetaminophen (TYLENOL) 325 MG tablet Take 2 tablets (650 mg total) by mouth every 4 (four) hours as needed for headache or mild pain.    Marland Kitchen amLODipine (NORVASC) 10 MG tablet Take 1 tablet (10 mg total) by mouth daily. 90 tablet 3  . aspirin 81 MG tablet Take 81 mg by mouth daily.      . B Complex Vitamins (VITAMIN B COMPLEX PO) Take 1 tablet by mouth daily.      . carvedilol (COREG) 25 MG tablet Take 2 tablets (50 mg total) by mouth 2 (two) times daily with a meal. 360 tablet 0  . Cholecalciferol (VITAMIN D-3 PO) Take 1 tablet by mouth daily.    Marland Kitchen CINNAMON PO Take 1 tablet by mouth daily.      . colchicine 0.6 MG tablet Take 2 tablets now, repeat 1 tablet 1 hour later. 3 tablet 0  . Cyanocobalamin (VITAMIN B-12 PO) Take 1 tablet by mouth daily.      . enalapril (VASOTEC) 20 MG tablet Take 1 tablet (20 mg total) by mouth 2  (two) times daily. 180 tablet 3  . furosemide (LASIX) 40 MG tablet Take 1 tablet (40 mg total) by mouth daily. 90 tablet 3  . Garlic TABS Take 1 tablet by mouth daily.      . ondansetron (ZOFRAN ODT) 4 MG disintegrating tablet Take 1 tablet (4 mg total) by mouth every 8 (eight) hours as needed for nausea or vomiting. 10 tablet 0  . predniSONE (DELTASONE) 20 MG tablet 3 Tabs PO Days 1-3, then 2 tabs PO Days 4-6, then 1 tab PO Day 7-9, then Half Tab PO Day 10-12 20 tablet 0  . zolpidem (AMBIEN) 10 MG tablet Take 1 tablet (10 mg total) by mouth at bedtime as needed for sleep. 20 tablet 0   No current facility-administered medications for this visit.     Past Medical History:  Diagnosis Date  . CAD (coronary artery disease)    NONOBSTRUCTIVE  a. cath 8/10: prox to mid LAD 20%  . Chronic systolic heart failure ([facility])   . Essential hypertension, benign   . Family history of breast cancer   . Family history of prostate cancer   . Gout   . Hypertrophic nonobstructive cardiomyopathy ([facility])    septal hypertrophy without LVOT  . NICM (nonischemic cardiomyopathy) ([facility])    a.  echo 8/10: EF20-25%, mod LAE, severe asymmetric septal hypertrophy (? nonobs. HCM); grade 1 diast dyfxn.  EF 55% echo June 6    Past Surgical History:  Procedure Laterality Date  . KNEE ARTHROSCOPY     right    ROS:   Positive for insomnia.  As stated in the HPI and negative for all other systems.  PHYSICAL EXAM BP 114/74   Pulse 66   Ht 6' (1.829 m)   Wt 233 lb (105.7 kg)   BMI 31.60 kg/m   GENERAL:  Well appearing NECK:  No jugular venous distention, waveform within normal limits, carotid upstroke brisk and symmetric, no bruits, no thyromegaly LUNGS:  Clear to auscultation bilaterally CHEST:  Unremarkable HEART:  PMI not displaced or sustained,S1 and S2 within normal limits, no S3, no S4, no clicks, no rubs, no murmurs ABD:  Flat, positive bowel sounds normal in frequency in pitch, no bruits, no rebound, no  guarding, no midline pulsatile mass, no hepatomegaly, no splenomegaly EXT:  2 plus pulses throughout, no edema, no cyanosis no clubbing  EKG: Sinus rhythm, rate 66, left ventriculography by voltage criteria, interventricular conduction delay, no change from previous.    ASSESSMENT AND PLAN  NICM:  He does have some septal hypertrophy.   I am going to order an MRI to look at this and the aorta.  He otherwise as no symptoms.     HTN:  His blood pressure is at target.  No change in therapy.   CHEST PAIN:  He had a calcium score of zero and normal coronaries.  In 2018.  No change in therapy.   AO ENLARGEMENT:  I will follow with the MRI.

## 2018-08-07 ENCOUNTER — Encounter: Payer: Self-pay | Admitting: Cardiology

## 2018-08-07 ENCOUNTER — Ambulatory Visit (INDEPENDENT_AMBULATORY_CARE_PROVIDER_SITE_OTHER): Payer: BLUE CROSS/BLUE SHIELD | Admitting: Cardiology

## 2018-08-07 VITALS — BP 114/74 | HR 66 | Ht 72.0 in | Wt 233.0 lb

## 2018-08-07 DIAGNOSIS — I421 Obstructive hypertrophic cardiomyopathy: Secondary | ICD-10-CM | POA: Diagnosis not present

## 2018-08-07 DIAGNOSIS — I7789 Other specified disorders of arteries and arterioles: Secondary | ICD-10-CM

## 2018-08-07 MED ORDER — ENALAPRIL MALEATE 20 MG PO TABS: 20.0000 mg | ORAL_TABLET | Freq: Two times a day (BID) | ORAL | 3 refills | Status: DC

## 2018-08-07 MED ORDER — AMLODIPINE BESYLATE 10 MG PO TABS: 10.0000 mg | ORAL_TABLET | Freq: Every day | ORAL | 3 refills | Status: DC

## 2018-08-07 MED ORDER — ZOLPIDEM TARTRATE 10 MG PO TABS: 10.0000 mg | ORAL_TABLET | Freq: Every evening | ORAL | 0 refills | Status: DC | PRN

## 2018-08-07 MED ORDER — FUROSEMIDE 40 MG PO TABS: 40.0000 mg | ORAL_TABLET | Freq: Every day | ORAL | 3 refills | Status: DC

## 2018-08-07 MED ORDER — CARVEDILOL 25 MG PO TABS: 50.0000 mg | ORAL_TABLET | Freq: Two times a day (BID) | ORAL | 0 refills | Status: DC

## 2018-08-07 NOTE — Patient Instructions (Signed)
Medication Instructions:  Continue current medications  If you need a refill on your cardiac medications before your next appointment, please call your pharmacy.  Labwork: BMP HERE IN OUR OFFICE AT [facility]  Take the provided lab slips with you to the lab for your blood draw.   You will NOT need to fast   Testing/Procedures: Your physician has requested that you have a MRI   Follow-Up: Your physician wants you to follow-up in: After MRI.      Thank you for choosing [facility]!!

## 2018-08-11 ENCOUNTER — Encounter: Payer: Self-pay | Admitting: Cardiology

## 2018-08-25 ENCOUNTER — Ambulatory Visit (HOSPITAL_COMMUNITY)
Admission: RE | Admit: 2018-08-25 | Discharge: 2018-08-25 | Disposition: A | Discharge status: Home / Self Care | Payer: BLUE CROSS/BLUE SHIELD | Source: Ambulatory Visit | Attending: Cardiology | Admitting: Cardiology

## 2018-08-25 DIAGNOSIS — I77810 Thoracic aortic ectasia: Secondary | ICD-10-CM | POA: Diagnosis not present

## 2018-08-25 DIAGNOSIS — I7789 Other specified disorders of arteries and arterioles: Secondary | ICD-10-CM | POA: Insufficient documentation

## 2018-08-25 DIAGNOSIS — I421 Obstructive hypertrophic cardiomyopathy: Secondary | ICD-10-CM

## 2018-08-25 DIAGNOSIS — I517 Cardiomegaly: Secondary | ICD-10-CM | POA: Insufficient documentation

## 2018-08-25 LAB — CREATININE, SERUM
Creatinine, Ser: 1.3 mg/dL — ABNORMAL HIGH (ref 0.61–1.24)
GFR calc Af Amer: >60 mL/min (ref >60)
GFR calc non Af Amer: 57 mL/min — ABNORMAL LOW (ref >60)

## 2018-08-25 MED ORDER — GADOBUTROL 1 MMOL/ML IV SOLN
15.0000 mL | Freq: Once | INTRAVENOUS | Status: AC | PRN
  Administered 2018-08-25: 15 mL via INTRAVENOUS

## 2018-08-28 ENCOUNTER — Telehealth: Payer: Self-pay | Admitting: *Deleted

## 2018-08-28 DIAGNOSIS — I422 Other hypertrophic cardiomyopathy: Secondary | ICD-10-CM

## 2018-08-28 NOTE — Telephone Encounter (Signed)
Echo and 24 hour holter ordered and send to schedule to be schudule

## 2018-08-28 NOTE — Telephone Encounter (Signed)
-----   Message from Rollene Rotunda, MD sent at 08/27/2018  6:48 PM EDT ----- He needs an echo to look at gradient.  Also I would like to look for arrhythmia with 24 Holter.  Follow up with me after this.  I will discuss referral for genetic testing.  Call Mr. Altamira with the results and send results to Georgianne Fick, MD

## 2018-09-04 ENCOUNTER — Ambulatory Visit: Payer: BLUE CROSS/BLUE SHIELD | Admitting: Podiatry

## 2018-09-12 ENCOUNTER — Ambulatory Visit (INDEPENDENT_AMBULATORY_CARE_PROVIDER_SITE_OTHER): Payer: BLUE CROSS/BLUE SHIELD

## 2018-09-12 ENCOUNTER — Ambulatory Visit (HOSPITAL_COMMUNITY): Payer: BLUE CROSS/BLUE SHIELD | Attending: Cardiovascular Disease

## 2018-09-12 ENCOUNTER — Other Ambulatory Visit: Payer: Self-pay | Admitting: Cardiology

## 2018-09-12 ENCOUNTER — Other Ambulatory Visit: Payer: Self-pay

## 2018-09-12 DIAGNOSIS — I454 Nonspecific intraventricular block: Secondary | ICD-10-CM

## 2018-09-12 DIAGNOSIS — I422 Other hypertrophic cardiomyopathy: Secondary | ICD-10-CM

## 2018-09-18 ENCOUNTER — Ambulatory Visit: Payer: BLUE CROSS/BLUE SHIELD | Admitting: Podiatry

## 2018-09-18 ENCOUNTER — Encounter: Payer: Self-pay | Admitting: Podiatry

## 2018-09-18 DIAGNOSIS — B351 Tinea unguium: Secondary | ICD-10-CM

## 2018-09-18 DIAGNOSIS — B353 Tinea pedis: Secondary | ICD-10-CM

## 2018-09-18 NOTE — Progress Notes (Signed)
Subjective:    Patient ID: Craig Prince, male    DOB: 1956/11/10, 62 y.o.   MRN: [id number]  HPI 62 year old male presents the office today for concerns of toenail fungus as well has some mild skin fungus.  This been ongoing for several years.  Tried over-the-counter treatment he has never had any professional treatment.  He states that he does wear socks all the time which seem to worsen the fungus.  Occasionally he will itch.  Denies any open sores.  No other concerns.   Review of Systems  All other systems reviewed and are negative.  Past Medical History:  Diagnosis Date  . CAD (coronary artery disease)    NONOBSTRUCTIVE  a. cath 8/10: prox to mid LAD 20%  . Chronic systolic heart failure ([facility])   . Essential hypertension, benign   . Family history of breast cancer   . Family history of prostate cancer   . Gout   . Hypertrophic nonobstructive cardiomyopathy ([facility])    septal hypertrophy without LVOT  . NICM (nonischemic cardiomyopathy) ([facility])    a. echo 8/10: EF20-25%, mod LAE, severe asymmetric septal hypertrophy (? nonobs. HCM); grade 1 diast dyfxn.  EF 55% echo June 6    Past Surgical History:  Procedure Laterality Date  . KNEE ARTHROSCOPY     right     Current Outpatient Medications:  .  NON FORMULARY, [facility]  Naftifine HCL Cream/Gel -  3% Apply 1-2 grams to affected area 2 times daily Qty. 120 gm 3 refills, Disp: , Rfl:  .  NON FORMULARY, [facility]  Onychomycosis Nail Lacquer -  Fluconazole 2%, Terbinafine 1% DMSO/undecylenic acid 25% Apply to affected nail once daily Qty. 120 gm 3 refills, Disp: , Rfl:  .  acetaminophen (TYLENOL) 325 MG tablet, Take 2 tablets (650 mg total) by mouth every 4 (four) hours as needed for headache or mild pain., Disp: , Rfl:  .  amLODipine (NORVASC) 10 MG tablet, Take 1 tablet (10 mg total) by mouth daily., Disp: 90 tablet, Rfl: 3 .  aspirin 81 MG tablet, Take 81 mg by mouth daily.  , Disp: , Rfl:  .  B Complex Vitamins  (VITAMIN B COMPLEX PO), Take 1 tablet by mouth daily.  , Disp: , Rfl:  .  carvedilol (COREG) 25 MG tablet, Take 2 tablets (50 mg total) by mouth 2 (two) times daily with a meal., Disp: 360 tablet, Rfl: 0 .  Cholecalciferol (VITAMIN D-3 PO), Take 1 tablet by mouth daily., Disp: , Rfl:  .  CINNAMON PO, Take 1 tablet by mouth daily.  , Disp: , Rfl:  .  colchicine 0.6 MG tablet, Take 2 tablets now, repeat 1 tablet 1 hour later., Disp: 3 tablet, Rfl: 0 .  Cyanocobalamin (VITAMIN B-12 PO), Take 1 tablet by mouth daily.  , Disp: , Rfl:  .  enalapril (VASOTEC) 20 MG tablet, Take 1 tablet (20 mg total) by mouth 2 (two) times daily., Disp: 180 tablet, Rfl: 3 .  furosemide (LASIX) 40 MG tablet, Take 1 tablet (40 mg total) by mouth daily., Disp: 90 tablet, Rfl: 3 .  Garlic TABS, Take 1 tablet by mouth daily.  , Disp: , Rfl:  .  ondansetron (ZOFRAN ODT) 4 MG disintegrating tablet, Take 1 tablet (4 mg total) by mouth every 8 (eight) hours as needed for nausea or vomiting., Disp: 10 tablet, Rfl: 0 .  predniSONE (DELTASONE) 20 MG tablet, 3 Tabs PO Days 1-3, then 2 tabs PO Days  4-6, then 1 tab PO Day 7-9, then Half Tab PO Day 10-12, Disp: 20 tablet, Rfl: 0 .  zolpidem (AMBIEN) 10 MG tablet, Take 1 tablet (10 mg total) by mouth at bedtime as needed for sleep., Disp: 20 tablet, Rfl: 0  No Known Allergies      Objective:   Physical Exam General: AAO x3, NAD  Dermatological: Nails are hypertrophic, dystrophic with yellow to brown discoloration.  There is no pain in the nails there is no surrounding redness or drainage or any clinical signs of infection noted today.  No significant athlete's foot is present today.  There is no open lesions.  No blisters.  Vascular: Dorsalis Pedis artery and Posterior Tibial artery pedal pulses are 2/4 bilateral with immedate capillary fill time.  There is no pain with calf compression, swelling, warmth, erythema.   Neruologic: Grossly intact via light touch bilateral.  Protective  threshold with Semmes Wienstein monofilament intact to all pedal sites bilateral.   Musculoskeletal: No gross boney pedal deformities bilateral. No pain, crepitus, or limitation noted with foot and ankle range of motion bilateral. Muscular strength 5/5 in all groups tested bilateral.  Gait: Unassisted, Nonantalgic.      Assessment & Plan:  62 year old male with onychomycosis -Treatment options discussed including all alternatives, risks, and complications -Etiology of symptoms were discussed -After discussion regarding treatment options he wants to proceed with topical treatment.  I ordered topical antifungal medicine through [employer].  There is very minimal to no tinea pedis today but also prescribed an athlete's foot cream as well.  This was also done through [facility] and I ordered naftine.   Vivi Barrack DPM

## 2018-09-20 DIAGNOSIS — B351 Tinea unguium: Secondary | ICD-10-CM | POA: Insufficient documentation

## 2018-09-26 ENCOUNTER — Telehealth: Payer: Self-pay | Admitting: Podiatry

## 2018-09-26 NOTE — Telephone Encounter (Signed)
I informed pt of [facility] [phone] to call for coverage and delivery information.

## 2018-09-26 NOTE — Telephone Encounter (Signed)
Please let me know if he has any issues getting it.

## 2018-09-26 NOTE — Telephone Encounter (Signed)
Pt was suppose to receive some medication for foot fungus at his pharmacy and the pharmacy stated that they did not receive it. Patient was calling to follow up

## 2018-10-02 ENCOUNTER — Ambulatory Visit: Payer: BLUE CROSS/BLUE SHIELD | Admitting: Cardiology

## 2018-10-02 NOTE — Progress Notes (Signed)
HPI The patient presents for followup of his hypertension and cardiomyopathy. He has a nonischemic cardiomyopathy with an original ejection fraction of 25% to 50% on followup in 2012 and 14.  He does have some septal hypertrophy. I sent him for an exercise treadmill test. It was reported that he dropped his blood pressure. He was able to go for about 9 minutes and 30 seconds without symptoms however. I was going to get a dobutamine echocardiogram to look for high risk findings and inducible gradient. However, he canceled this appointment.  He came into the hospital with chest pain and ruled out. He subsequently had a CT coronary angiogram with normal coronaries and zero calcium score.      Since I last saw him he had a cardiac MRI that demonstrated extensive mid wall gadolinium enhancement with septal wall thickening all supporting the diagnosis of hypertrophic cardiomyopathy.  An echocardiogram suggested that his EF was about 50%.  There was some septal anterior motion of the mitral valve.  However, there was no significant gradient.  He wore a Holter and there was no evidence of arrhythmias.  He returns for follow-up.  He says he is been feeling well.  He denies any palpitations, presyncope or syncope.  He has had no chest pressure, neck or arm discomfort.  He is had no weight gain or edema.   He has been active but not exercising as much as I would suggest.   No Known Allergies  Current Outpatient Medications  Medication Sig Dispense Refill  . acetaminophen (TYLENOL) 325 MG tablet Take 2 tablets (650 mg total) by mouth every 4 (four) hours as needed for headache or mild pain.    Marland Kitchen amLODipine (NORVASC) 10 MG tablet Take 1 tablet (10 mg total) by mouth daily. 90 tablet 3  . aspirin 81 MG tablet Take 81 mg by mouth daily.      . B Complex Vitamins (VITAMIN B COMPLEX PO) Take 1 tablet by mouth daily.      . carvedilol (COREG) 25 MG tablet Take 2 tablets (50 mg total) by mouth 2 (two) times daily  with a meal. 360 tablet 0  . Cholecalciferol (VITAMIN D-3 PO) Take 1 tablet by mouth daily.    Marland Kitchen CINNAMON PO Take 1 tablet by mouth daily.      . colchicine 0.6 MG tablet Take 2 tablets now, repeat 1 tablet 1 hour later. 3 tablet 0  . Cyanocobalamin (VITAMIN B-12 PO) Take 1 tablet by mouth daily.      . enalapril (VASOTEC) 20 MG tablet Take 1 tablet (20 mg total) by mouth 2 (two) times daily. 180 tablet 3  . furosemide (LASIX) 40 MG tablet Take 1 tablet (40 mg total) by mouth daily. 90 tablet 3  . Garlic TABS Take 1 tablet by mouth daily.      Marland Kitchen NON FORMULARY [facility]  Naftifine HCL Cream/Gel -  3% Apply 1-2 grams to affected area 2 times daily Qty. 120 gm 3 refills    . NON FORMULARY [facility]  Onychomycosis Nail Lacquer -  Fluconazole 2%, Terbinafine 1% DMSO/undecylenic acid 25% Apply to affected nail once daily Qty. 120 gm 3 refills    . ondansetron (ZOFRAN ODT) 4 MG disintegrating tablet Take 1 tablet (4 mg total) by mouth every 8 (eight) hours as needed for nausea or vomiting. 10 tablet 0  . predniSONE (DELTASONE) 20 MG tablet 3 Tabs PO Days 1-3, then 2 tabs PO Days 4-6, then  1 tab PO Day 7-9, then Half Tab PO Day 10-12 20 tablet 0  . zolpidem (AMBIEN) 10 MG tablet Take 1 tablet (10 mg total) by mouth at bedtime as needed for sleep. 20 tablet 0   No current facility-administered medications for this visit.     Past Medical History:  Diagnosis Date  . CAD (coronary artery disease)    NONOBSTRUCTIVE  a. cath 8/10: prox to mid LAD 20%  . Chronic systolic heart failure (HCC)   . Essential hypertension, benign   . Family history of breast cancer   . Family history of prostate cancer   . Gout   . Hypertrophic nonobstructive cardiomyopathy (HCC)    septal hypertrophy without LVOT  . NICM (nonischemic cardiomyopathy) (HCC)    a. echo 8/10: EF20-25%, mod LAE, severe asymmetric septal hypertrophy (? nonobs. HCM); grade 1 diast dyfxn.  EF 55% echo June 6    Past  Surgical History:  Procedure Laterality Date  . KNEE ARTHROSCOPY     right    ROS:   As stated in the HPI and negative for all other systems.  PHYSICAL EXAM BP 138/90 (BP Location: Left Arm, Patient Position: Sitting, Cuff Size: Large)   Pulse 78   Ht 6' (1.829 m)   Wt 239 lb (108.4 kg)   BMI 32.41 kg/m   GENERAL:  Well appearing NECK:  No jugular venous distention, waveform within normal limits, carotid upstroke brisk and symmetric, no bruits, no thyromegaly LUNGS:  Clear to auscultation bilaterally CHEST:  Unremarkable HEART:  PMI not displaced or sustained,S1 and S2 within normal limits, no S3, no S4, no clicks, no rubs, no murmurs ABD:  Flat, positive bowel sounds normal in frequency in pitch, no bruits, no rebound, no guarding, no midline pulsatile mass, no hepatomegaly, no splenomegaly EXT:  2 plus pulses throughout, no edema, no cyanosis no clubbing   EKG: NA   ASSESSMENT AND PLAN   HCM:  He does have septal hypertrophy.  EF was 50%.    He clearly has HCM.  However, he has no high risk features.  I talked to him he had great length and went through the physiology of this.  He has 14 and his family brothers and sisters and his 7 children.  He is been no sudden death.  However, because of his confirmed diagnosis I have suggested that he have genetic counseling while he would agree to look into his pending the cost.  In the meantime I will follow-up again next year with another echocardiogram.  He has no high risk features or findings.    HTN:  His blood pressure is controlled.  He will continue meds as listed.    CHEST PAIN:  He had a calcium score of zero and normal coronaries.  No further work up   AO ENLARGEMENT:  The aorta measured 4.1 cm.  I will follow up with CTs or MRIs in the future.     I will follow with the MRI.    Echo, CT, MRI reviewed images for this appt.

## 2018-10-05 ENCOUNTER — Telehealth: Payer: Self-pay | Admitting: *Deleted

## 2018-10-05 ENCOUNTER — Ambulatory Visit: Payer: BLUE CROSS/BLUE SHIELD | Admitting: Cardiology

## 2018-10-05 ENCOUNTER — Ambulatory Visit (INDEPENDENT_AMBULATORY_CARE_PROVIDER_SITE_OTHER): Payer: BLUE CROSS/BLUE SHIELD | Admitting: Cardiology

## 2018-10-05 ENCOUNTER — Encounter: Payer: Self-pay | Admitting: Cardiology

## 2018-10-05 VITALS — BP 138/90 | HR 78 | Ht 72.0 in | Wt 239.0 lb

## 2018-10-05 DIAGNOSIS — I7789 Other specified disorders of arteries and arterioles: Secondary | ICD-10-CM

## 2018-10-05 DIAGNOSIS — I10 Essential (primary) hypertension: Secondary | ICD-10-CM

## 2018-10-05 DIAGNOSIS — I422 Other hypertrophic cardiomyopathy: Secondary | ICD-10-CM | POA: Diagnosis not present

## 2018-10-05 NOTE — Patient Instructions (Signed)
Medication Instructions:  Continue current medications  If you need a refill on your cardiac medications before your next appointment, please call your pharmacy.  Labwork: None Ordered   If you have labs (blood work) drawn today and your tests are completely normal, you will receive your results only by: Marland Kitchen MyChart Message (if you have MyChart) OR . A paper copy in the mail If you have any lab test that is abnormal or we need to change your treatment, we will call you to review the results.  Testing/Procedures: None Ordered  Follow-Up: You have been referred to [facility]  You will need a follow up appointment in 1 Year.  Please call our office 2 months in [phone]) to schedule the (1 Year) appointment.  You may see  DR Antoine Poche, or one of the following Advanced Practice Providers on your designated Care Team:    . Joni Reining, DNP, ANP . Rhonda Barrett, PA-C  . Corine Shelter, [place]  . Azalee Course, PA-C . Micah Flesher, PA-C  At [facility], you and your health needs are our priority.  As part of our continuing mission to provide you with exceptional heart care, we have created designated Provider Care Teams.  These Care Teams include your primary Cardiologist (physician) and Advanced Practice Providers (APPs -  Physician Assistants and Nurse Practitioners) who all work together to provide you with the care you need, when you need it.   Thank you for choosing [facility] at [facility]!!

## 2018-10-05 NOTE — Telephone Encounter (Signed)
Left message regarding appointment with Dr. Jomarie Longs [place]----11/09/18 at 10:00am at the [facility].  Will mail appointment calendar to patient.

## 2018-11-09 ENCOUNTER — Encounter: Payer: BLUE CROSS/BLUE SHIELD | Admitting: Genetic Counselor

## 2018-11-21 ENCOUNTER — Other Ambulatory Visit: Payer: Self-pay | Admitting: Cardiology

## 2019-08-29 ENCOUNTER — Other Ambulatory Visit: Payer: Self-pay

## 2019-08-29 MED ORDER — ENALAPRIL MALEATE 20 MG PO TABS: 20.0000 mg | ORAL_TABLET | Freq: Two times a day (BID) | ORAL | 1 refills | Status: DC

## 2019-11-01 ENCOUNTER — Telehealth: Payer: Self-pay | Admitting: Cardiology

## 2019-11-01 NOTE — Telephone Encounter (Signed)
Spoke with patient regarding 12 month follow up with Dr. Percival Spanish.  He states due to his insurance--he has to see a provider at [facility].

## 2019-12-06 ENCOUNTER — Emergency Department (HOSPITAL_COMMUNITY): Payer: BC Managed Care – PPO

## 2019-12-06 ENCOUNTER — Emergency Department (HOSPITAL_COMMUNITY)
Admission: EM | Admit: 2019-12-06 | Discharge: 2019-12-07 | Disposition: A | Discharge status: Home / Self Care | Payer: BC Managed Care – PPO | Attending: Emergency Medicine | Admitting: Emergency Medicine

## 2019-12-06 ENCOUNTER — Other Ambulatory Visit: Payer: Self-pay

## 2019-12-06 ENCOUNTER — Encounter (HOSPITAL_COMMUNITY): Payer: Self-pay | Admitting: Emergency Medicine

## 2019-12-06 DIAGNOSIS — Z79899 Other long term (current) drug therapy: Secondary | ICD-10-CM | POA: Insufficient documentation

## 2019-12-06 DIAGNOSIS — M25461 Effusion, right knee: Secondary | ICD-10-CM | POA: Insufficient documentation

## 2019-12-06 DIAGNOSIS — I110 Hypertensive heart disease with heart failure: Secondary | ICD-10-CM | POA: Insufficient documentation

## 2019-12-06 DIAGNOSIS — Z8739 Personal history of other diseases of the musculoskeletal system and connective tissue: Secondary | ICD-10-CM | POA: Diagnosis not present

## 2019-12-06 DIAGNOSIS — I2510 Atherosclerotic heart disease of native coronary artery without angina pectoris: Secondary | ICD-10-CM | POA: Diagnosis not present

## 2019-12-06 DIAGNOSIS — Z7982 Long term (current) use of aspirin: Secondary | ICD-10-CM | POA: Diagnosis not present

## 2019-12-06 DIAGNOSIS — M25561 Pain in right knee: Secondary | ICD-10-CM | POA: Diagnosis present

## 2019-12-06 DIAGNOSIS — I5022 Chronic systolic (congestive) heart failure: Secondary | ICD-10-CM | POA: Diagnosis not present

## 2019-12-06 HISTORY — DX: Unspecified osteoarthritis, unspecified site: M19.90

## 2019-12-06 NOTE — ED Triage Notes (Signed)
Patient reports persistent arthritic pain at right knee with swelling for several days unrelieved by Cortizone injection and prescription Meloxicam .

## 2019-12-07 MED ORDER — HYDROMORPHONE HCL 1 MG/ML IJ SOLN
2.0000 mg | Freq: Once | INTRAMUSCULAR | Status: AC
  Administered 2019-12-07: 2 mg via INTRAMUSCULAR
  Filled 2019-12-07: qty 2

## 2019-12-07 MED ORDER — PREDNISONE 20 MG PO TABS
60.0000 mg | ORAL_TABLET | Freq: Once | ORAL | Status: AC
  Administered 2019-12-07: 60 mg via ORAL
  Filled 2019-12-07: qty 3

## 2019-12-07 MED ORDER — HYDROCODONE-ACETAMINOPHEN 5-325 MG PO TABS: 1.0000 | ORAL_TABLET | Freq: Four times a day (QID) | ORAL | 0 refills | Status: DC | PRN

## 2019-12-07 MED ORDER — PREDNISONE 10 MG PO TABS: 20.0000 mg | ORAL_TABLET | Freq: Two times a day (BID) | ORAL | 0 refills | Status: DC

## 2019-12-07 MED ORDER — ONDANSETRON 8 MG PO TBDP: ORAL_TABLET | ORAL | 0 refills | Status: DC

## 2019-12-07 NOTE — ED Provider Notes (Signed)
[place] EMERGENCY DEPARTMENT Provider Note   CSN: [id number] Arrival date & time: 12/06/19  2243     History Chief Complaint  Patient presents with  . Knee Pain    Arthritis    Craig Prince is a 64 y.o. male.  Patient is a 64 year old male with past medical history of nonobstructive coronary artery disease, hypertension, gout, and osteoarthritis of the right knee.  He presents today with complaints of right knee pain.  He was seen at urgent care 2 days ago and received a Kenalog injection, however this does not seem to be helping.  He denies any new injury or trauma.  He denies any fevers or chills.  The history is provided by the patient.  Knee Pain Location:  Knee Time since incident:  1 week Knee location:  R knee Pain details:    Quality:  Throbbing   Radiates to:  Does not radiate   Severity:  Moderate   Onset quality:  Gradual   Timing:  Constant   Progression:  Worsening      Past Medical History:  Diagnosis Date  . Arthritis   . CAD (coronary artery disease)    NONOBSTRUCTIVE  a. cath 8/10: prox to mid LAD 20%  . Chronic systolic heart failure ([place])   . Essential hypertension, benign   . Family history of breast cancer   . Family history of prostate cancer   . Gout   . Hypertrophic nonobstructive cardiomyopathy ([place])    septal hypertrophy without LVOT  . NICM (nonischemic cardiomyopathy) ([place])    a. echo 8/10: EF20-25%, mod LAE, severe asymmetric septal hypertrophy (? nonobs. HCM); grade 1 diast dyfxn.  EF 55% echo June 6    Patient Active Problem List   Diagnosis Date Noted  . Aortic root enlargement ([place]) 10/05/2018  . Onychomycosis 09/20/2018  . Genetic testing 07/27/2018  . Family history of prostate cancer   . Family history of breast cancer   . Troponin level elevated 12/29/2016  . Chest pain with high risk for cardiac etiology 12/28/2016  . Chest pain 12/28/2016  . Left inguinal hernia 01/08/2014  . Insomnia 05/19/2012   . Hypertrophic nonobstructive cardiomyopathy ([place]) 05/04/2011  . Chronic systolic heart failure ([place])   . Non Obstructive CAD by cath 06/2009   . OBESITY, UNSPECIFIED 10/14/2010  . History of NICM- resolved at last echo 08/20/2009  . Essential hypertension 01/26/2007  . GASTROINTESTINAL HEMORRHAGE 01/26/2007    Past Surgical History:  Procedure Laterality Date  . KNEE ARTHROSCOPY     right       Family History  Problem Relation Age of Onset  . Kidney disease Brother   . Cancer Brother        prostate  . Kidney disease Father   . Diabetes Mother   . Heart attack Mother 87  . Heart disease Mother   . Cancer Sister        breast  . CAD Brother 63       60% blockage  . Breast cancer Sister 29  . Breast cancer Sister 37       PALB2 pos  . Healthy Son   . Healthy Daughter     Social History   Tobacco Use  . Smoking status: Never Smoker  . Smokeless tobacco: Never Used  Substance Use Topics  . Alcohol use: No    Alcohol/week: 0.0 standard drinks  . Drug use: No    Home Medications Prior to Admission  medications   Medication Sig Start Date End Date Taking? Authorizing Provider  acetaminophen (TYLENOL) 325 MG tablet Take 2 tablets (650 mg total) by mouth every 4 (four) hours as needed for headache or mild pain. 12/29/16   Erlene Quan, PA-C  amLODipine (NORVASC) 10 MG tablet Take 1 tablet (10 mg total) by mouth daily. 08/07/18   Minus Breeding, MD  aspirin 81 MG tablet Take 81 mg by mouth daily.      [provider]  B Complex Vitamins (VITAMIN B COMPLEX PO) Take 1 tablet by mouth daily.      [provider]  carvedilol (COREG) 25 MG tablet TAKE 2 TABLETS(50 MG) BY MOUTH TWICE DAILY WITH A MEAL 11/23/18   Minus Breeding, MD  Cholecalciferol (VITAMIN D-3 PO) Take 1 tablet by mouth daily.    [provider]  CINNAMON PO Take 1 tablet by mouth daily.      [provider]  colchicine 0.6 MG tablet Take 2 tablets now, repeat 1 tablet 1  hour later. 01/07/16   Melony Overly, MD  Cyanocobalamin (VITAMIN B-12 PO) Take 1 tablet by mouth daily.      [provider]  enalapril (VASOTEC) 20 MG tablet Take 1 tablet (20 mg total) by mouth 2 (two) times daily. 08/29/19   Minus Breeding, MD  furosemide (LASIX) 40 MG tablet Take 1 tablet (40 mg total) by mouth daily. 08/07/18   Minus Breeding, MD  Garlic TABS Take 1 tablet by mouth daily.      [provider]  NON [place]  Naftifine HCL Cream/Gel -  3% Apply 1-2 grams to affected area 2 times daily Qty. 120 gm 3 refills    [provider]  NON FORMULARY [facility]  Onychomycosis Nail Lacquer -  Fluconazole 2%, Terbinafine 1% DMSO/undecylenic acid 25% Apply to affected nail once daily Qty. 120 gm 3 refills    [provider]  ondansetron (ZOFRAN ODT) 4 MG disintegrating tablet Take 1 tablet (4 mg total) by mouth every 8 (eight) hours as needed for nausea or vomiting. 01/04/18   Carlisle Cater, PA-C  predniSONE (DELTASONE) 20 MG tablet 3 Tabs PO Days 1-3, then 2 tabs PO Days 4-6, then 1 tab PO Day 7-9, then Half Tab PO Day 10-12 01/04/18   Carlisle Cater, PA-C  zolpidem (AMBIEN) 10 MG tablet Take 1 tablet (10 mg total) by mouth at bedtime as needed for sleep. 08/07/18 06/12/22  Minus Breeding, MD    Allergies    Patient has no known allergies.  Review of Systems   Review of Systems  All other systems reviewed and are negative.   Physical Exam Updated Vital Signs BP (!) 145/61   Pulse 67   Temp 98 F (36.7 C) (Oral)   Resp 17   SpO2 98%   Physical Exam Vitals and nursing note reviewed.  Constitutional:      General: He is not in acute distress.    Appearance: Normal appearance. He is not ill-appearing.  HENT:     Head: Normocephalic and atraumatic.  Pulmonary:     Effort: Pulmonary effort is normal.  Musculoskeletal:     Comments: The right knee has a moderate sized effusion palpable.  He has pain with range of  motion which limits exam.  There is no warmth or surrounding erythema.  Skin:    General: Skin is warm and dry.  Neurological:     Mental Status: He is alert and oriented  to person, place, and time.     ED Results / Procedures / Treatments   Labs (all labs ordered are listed, but only abnormal results are displayed) Labs Reviewed - No data to display  EKG None  Radiology DG Knee Complete 4 Views Right  Result Date: 12/06/2019 CLINICAL DATA:  Pain and swelling EXAM: RIGHT KNEE - COMPLETE 4+ VIEW COMPARISON:  None. FINDINGS: No fracture or malalignment. Advanced patellofemoral, medial and lateral joint space degenerative change. Moderate knee effusion IMPRESSION: Tricompartment arthritis with moderate knee effusion. Electronically Signed   By: Donavan Foil M.D.   On: 12/06/2019 23:36    Procedures Procedures (including critical care time)  Medications Ordered in ED Medications  HYDROmorphone (DILAUDID) injection 2 mg (has no administration in time range)  predniSONE (DELTASONE) tablet 60 mg (has no administration in time range)    ED Course  I have reviewed the triage vital signs and the nursing notes.  Pertinent labs & imaging results that were available during my care of the patient were reviewed by me and considered in my medical decision making (see chart for details).    MDM Rules/Calculators/A&P  Patient with ongoing knee pain despite Kenalog injection at urgent care.  He continues with a moderate sized knee effusion.  I highly suspect that this is osteoarthritis with effusion.  Less likely would be gout, followed by a septic joint.  He has had no fevers and the knee does not appear warm.  I highly doubt a septic joint.  He will be treated with prednisone, pain medication, and follow-up with orthopedics as needed.  Final Clinical Impression(s) / ED Diagnoses Final diagnoses:  None    Rx / DC Orders ED Discharge Orders    None       Veryl Speak, MD 12/07/19  [phone]

## 2019-12-07 NOTE — Discharge Instructions (Addendum)
Begin taking prednisone as prescribed.  Take hydrocodone as prescribed as needed for pain, and Zofran as prescribed as needed for nausea.  Follow-up with your orthopedic surgeon if symptoms or not improving in the next week.

## 2020-01-04 ENCOUNTER — Other Ambulatory Visit: Payer: Self-pay | Admitting: Cardiology

## 2020-04-08 ENCOUNTER — Other Ambulatory Visit: Payer: Self-pay

## 2020-04-08 MED ORDER — CARVEDILOL 25 MG PO TABS: 50.0000 mg | ORAL_TABLET | Freq: Two times a day (BID) | ORAL | 0 refills | Status: DC

## 2020-07-19 ENCOUNTER — Emergency Department (HOSPITAL_BASED_OUTPATIENT_CLINIC_OR_DEPARTMENT_OTHER): Payer: Medicare Other

## 2020-07-19 ENCOUNTER — Inpatient Hospital Stay (HOSPITAL_BASED_OUTPATIENT_CLINIC_OR_DEPARTMENT_OTHER)
Admission: EM | Admit: 2020-07-19 | Discharge: 2020-07-21 | DRG: 177 | Disposition: A | Discharge status: Home / Self Care | Payer: Medicare Other | Attending: Family Medicine | Admitting: Family Medicine

## 2020-07-19 ENCOUNTER — Other Ambulatory Visit: Payer: Self-pay

## 2020-07-19 ENCOUNTER — Encounter (HOSPITAL_BASED_OUTPATIENT_CLINIC_OR_DEPARTMENT_OTHER): Payer: Self-pay | Admitting: Emergency Medicine

## 2020-07-19 DIAGNOSIS — I82441 Acute embolism and thrombosis of right tibial vein: Secondary | ICD-10-CM | POA: Diagnosis present

## 2020-07-19 DIAGNOSIS — Z7984 Long term (current) use of oral hypoglycemic drugs: Secondary | ICD-10-CM

## 2020-07-19 DIAGNOSIS — I10 Essential (primary) hypertension: Secondary | ICD-10-CM

## 2020-07-19 DIAGNOSIS — I5022 Chronic systolic (congestive) heart failure: Secondary | ICD-10-CM | POA: Diagnosis present

## 2020-07-19 DIAGNOSIS — Z841 Family history of disorders of kidney and ureter: Secondary | ICD-10-CM

## 2020-07-19 DIAGNOSIS — U071 COVID-19: Secondary | ICD-10-CM | POA: Diagnosis not present

## 2020-07-19 DIAGNOSIS — I422 Other hypertrophic cardiomyopathy: Secondary | ICD-10-CM | POA: Diagnosis present

## 2020-07-19 DIAGNOSIS — M109 Gout, unspecified: Secondary | ICD-10-CM | POA: Diagnosis present

## 2020-07-19 DIAGNOSIS — Z79899 Other long term (current) drug therapy: Secondary | ICD-10-CM

## 2020-07-19 DIAGNOSIS — M1990 Unspecified osteoarthritis, unspecified site: Secondary | ICD-10-CM | POA: Diagnosis present

## 2020-07-19 DIAGNOSIS — J1282 Pneumonia due to coronavirus disease 2019: Secondary | ICD-10-CM | POA: Diagnosis present

## 2020-07-19 DIAGNOSIS — N179 Acute kidney failure, unspecified: Secondary | ICD-10-CM | POA: Diagnosis present

## 2020-07-19 DIAGNOSIS — Z833 Family history of diabetes mellitus: Secondary | ICD-10-CM

## 2020-07-19 DIAGNOSIS — Z803 Family history of malignant neoplasm of breast: Secondary | ICD-10-CM

## 2020-07-19 DIAGNOSIS — I428 Other cardiomyopathies: Secondary | ICD-10-CM | POA: Diagnosis present

## 2020-07-19 DIAGNOSIS — I2510 Atherosclerotic heart disease of native coronary artery without angina pectoris: Secondary | ICD-10-CM | POA: Diagnosis present

## 2020-07-19 DIAGNOSIS — E119 Type 2 diabetes mellitus without complications: Secondary | ICD-10-CM | POA: Diagnosis present

## 2020-07-19 DIAGNOSIS — I712 Thoracic aortic aneurysm, without rupture: Secondary | ICD-10-CM | POA: Diagnosis present

## 2020-07-19 DIAGNOSIS — Z7952 Long term (current) use of systemic steroids: Secondary | ICD-10-CM

## 2020-07-19 DIAGNOSIS — I110 Hypertensive heart disease with heart failure: Secondary | ICD-10-CM | POA: Diagnosis present

## 2020-07-19 DIAGNOSIS — Z8042 Family history of malignant neoplasm of prostate: Secondary | ICD-10-CM

## 2020-07-19 DIAGNOSIS — Z8249 Family history of ischemic heart disease and other diseases of the circulatory system: Secondary | ICD-10-CM

## 2020-07-19 LAB — RESP PANEL BY RT PCR (RSV, FLU A&B, COVID)
Influenza A by PCR: NEGATIVE
Influenza B by PCR: NEGATIVE
Respiratory Syncytial Virus by PCR: NEGATIVE
SARS Coronavirus 2 by RT PCR: POSITIVE — AB

## 2020-07-19 LAB — BASIC METABOLIC PANEL
Anion gap: 15 (ref 5–15)
BUN: 19 mg/dL (ref 8–23)
CO2: 26 mmol/L (ref 22–32)
Calcium: 8.9 mg/dL (ref 8.9–10.3)
Chloride: 97 mmol/L — ABNORMAL LOW (ref 98–111)
Creatinine, Ser: 1.74 mg/dL — ABNORMAL HIGH (ref 0.61–1.24)
GFR calc Af Amer: 47 mL/min — ABNORMAL LOW (ref >60)
GFR calc non Af Amer: 40 mL/min — ABNORMAL LOW (ref >60)
Glucose, Bld: 117 mg/dL — ABNORMAL HIGH (ref 70–99)
Potassium: 3.7 mmol/L (ref 3.5–5.1)
Sodium: 138 mmol/L (ref 135–145)

## 2020-07-19 LAB — CBC
HCT: 41.8 % (ref 39.0–52.0)
Hemoglobin: 12.9 g/dL — ABNORMAL LOW (ref 13.0–17.0)
MCH: 21.6 pg — ABNORMAL LOW (ref 26.0–34.0)
MCHC: 30.9 g/dL (ref 30.0–36.0)
MCV: 69.9 fL — ABNORMAL LOW (ref 80.0–100.0)
Platelets: 225 10*3/uL (ref 150–400)
RBC: 5.98 MIL/uL — ABNORMAL HIGH (ref 4.22–5.81)
RDW: 16.6 % — ABNORMAL HIGH (ref 11.5–15.5)
WBC: 7.4 10*3/uL (ref 4.0–10.5)
nRBC: 0 % (ref 0.0–0.2)

## 2020-07-19 LAB — TROPONIN I (HIGH SENSITIVITY): Troponin I (High Sensitivity): 38 ng/L — ABNORMAL HIGH (ref <18)

## 2020-07-19 LAB — D-DIMER, QUANTITATIVE: D-Dimer, Quant: 10.09 ug/mL-FEU — ABNORMAL HIGH (ref 0.00–0.50)

## 2020-07-19 LAB — BRAIN NATRIURETIC PEPTIDE: B Natriuretic Peptide: 137.4 pg/mL — ABNORMAL HIGH (ref 0.0–100.0)

## 2020-07-19 MED ORDER — LACTATED RINGERS IV BOLUS: 500.0000 mL | Freq: Once | INTRAVENOUS | Status: DC

## 2020-07-19 MED ORDER — LACTATED RINGERS IV BOLUS
500.0000 mL | Freq: Once | INTRAVENOUS | Status: AC
  Administered 2020-07-19: 500 mL via INTRAVENOUS

## 2020-07-19 MED ORDER — IOHEXOL 350 MG/ML SOLN
100.0000 mL | Freq: Once | INTRAVENOUS | Status: AC | PRN
  Administered 2020-07-19: 100 mL via INTRAVENOUS

## 2020-07-19 MED ORDER — ASPIRIN 81 MG PO CHEW
324.0000 mg | CHEWABLE_TABLET | Freq: Once | ORAL | Status: AC
  Administered 2020-07-19: 324 mg via ORAL
  Filled 2020-07-19: qty 4

## 2020-07-19 NOTE — ED Notes (Signed)
ED Provider Myrtis Ser, MD at bedside.

## 2020-07-19 NOTE — ED Notes (Signed)
ED Provider at bedside. 

## 2020-07-19 NOTE — ED Provider Notes (Signed)
[facility]ENC[facility] DEPARTMENT Provider Note   CSN: [id number] Arrival date & time: 07/19/20  1623     History Chief Complaint  Patient presents with  . Cough    PUI  . Hypertension    Craig Prince is a 64 y.o. male.   Shortness of Breath Severity:  Moderate Onset quality:  Gradual Duration:  1 day Timing:  Constant Progression:  Waxing and waning Chronicity:  New Context comment:  While sleeping Relieved by:  Nothing Exacerbated by: laying down to sleep. Ineffective treatments:  None tried Associated symptoms: cough   Associated symptoms: no chest pain, no fever, no headaches, no rash and no vomiting   Risk factors comment:  Covid      Past Medical History:  Diagnosis Date  . Arthritis   . CAD (coronary artery disease)    NONOBSTRUCTIVE  a. cath 8/10: prox to mid LAD 20%  . Chronic systolic heart failure ([place])   . Essential hypertension, benign   . Family history of breast cancer   . Family history of prostate cancer   . Gout   . Hypertrophic nonobstructive cardiomyopathy ([place])    septal hypertrophy without LVOT  . NICM (nonischemic cardiomyopathy) ([place])    a. echo 8/10: EF20-25%, mod LAE, severe asymmetric septal hypertrophy (? nonobs. HCM); grade 1 diast dyfxn.  EF 55% echo June 6    Patient Active Problem List   Diagnosis Date Noted  . Aortic root enlargement ([place]) 10/05/2018  . Onychomycosis 09/20/2018  . Genetic testing 07/27/2018  . Family history of prostate cancer   . Family history of breast cancer   . Troponin level elevated 12/29/2016  . Chest pain with high risk for cardiac etiology 12/28/2016  . Chest pain 12/28/2016  . Left inguinal hernia 01/08/2014  . Insomnia 05/19/2012  . Hypertrophic nonobstructive cardiomyopathy ([place]) 05/04/2011  . Chronic systolic heart failure ([place])   . Non Obstructive CAD by cath 06/2009   . OBESITY, UNSPECIFIED 10/14/2010  . History of NICM- resolved at last echo 08/20/2009  . Essential  hypertension 01/26/2007  . GASTROINTESTINAL HEMORRHAGE 01/26/2007    Past Surgical History:  Procedure Laterality Date  . KNEE ARTHROSCOPY     right       Family History  Problem Relation Age of Onset  . Kidney disease Brother   . Cancer Brother        prostate  . Kidney disease Father   . Diabetes Mother   . Heart attack Mother 23  . Heart disease Mother   . Cancer Sister        breast  . CAD Brother 24       60% blockage  . Breast cancer Sister 64  . Breast cancer Sister 69       PALB2 pos  . Healthy Son   . Healthy Daughter     Social History   Tobacco Use  . Smoking status: Never Smoker  . Smokeless tobacco: Never Used  Substance Use Topics  . Alcohol use: No    Alcohol/week: 0.0 standard drinks  . Drug use: No    Home Medications Prior to Admission medications   Medication Sig Start Date End Date Taking? Authorizing Provider  acetaminophen (TYLENOL) 325 MG tablet Take 2 tablets (650 mg total) by mouth every 4 (four) hours as needed for headache or mild pain. 12/29/16   Erlene Quan, PA-C  amLODipine (NORVASC) 10 MG tablet Take 1 tablet (10 mg total) by mouth daily.  08/07/18   Minus Breeding, MD  aspirin 81 MG tablet Take 81 mg by mouth daily.      [provider]  B Complex Vitamins (VITAMIN B COMPLEX PO) Take 1 tablet by mouth daily.      [provider]  carvedilol (COREG) 25 MG tablet Take 2 tablets (50 mg total) by mouth 2 (two) times daily with a meal. Please schedule annual appt with Dr. Percival Spanish for refills. [phone]. 3rd attempt. 04/08/20   Minus Breeding, MD  Cholecalciferol (VITAMIN D-3 PO) Take 1 tablet by mouth daily.    [provider]  CINNAMON PO Take 1 tablet by mouth daily.      [provider]  colchicine 0.6 MG tablet Take 2 tablets now, repeat 1 tablet 1 hour later. 01/07/16   Melony Overly, MD  Cyanocobalamin (VITAMIN B-12 PO) Take 1 tablet by mouth daily.      [provider]  enalapril  (VASOTEC) 20 MG tablet Take 1 tablet (20 mg total) by mouth 2 (two) times daily. 08/29/19   Minus Breeding, MD  furosemide (LASIX) 40 MG tablet Take 1 tablet (40 mg total) by mouth daily. 08/07/18   Minus Breeding, MD  Garlic TABS Take 1 tablet by mouth daily.      [provider]  HYDROcodone-acetaminophen (NORCO) 5-325 MG tablet Take 1-2 tablets by mouth every 6 (six) hours as needed. 12/07/19   Veryl Speak, MD  NON FORMULARY [facility]  Naftifine HCL Cream/Gel -  3% Apply 1-2 grams to affected area 2 times daily Qty. 120 gm 3 refills    [provider]  NON FORMULARY [facility]  Onychomycosis Nail Lacquer -  Fluconazole 2%, Terbinafine 1% DMSO/undecylenic acid 25% Apply to affected nail once daily Qty. 120 gm 3 refills    [provider]  ondansetron (ZOFRAN ODT) 8 MG disintegrating tablet 34m ODT q4 hours prn nausea 12/07/19   DVeryl Speak MD  predniSONE (DELTASONE) 10 MG tablet Take 2 tablets (20 mg total) by mouth 2 (two) times daily with a meal. 12/07/19   DVeryl Speak MD  zolpidem (AMBIEN) 10 MG tablet Take 1 tablet (10 mg total) by mouth at bedtime as needed for sleep. 08/07/18 06/12/22  HMinus Breeding MD    Allergies    Patient has no known allergies.  Review of Systems   Review of Systems  Constitutional: Negative for chills and fever.  HENT: Negative for congestion and rhinorrhea.   Respiratory: Positive for cough and shortness of breath.   Cardiovascular: Negative for chest pain and palpitations.  Gastrointestinal: Negative for diarrhea, nausea and vomiting.  Genitourinary: Negative for difficulty urinating and dysuria.  Musculoskeletal: Negative for arthralgias and back pain.  Skin: Negative for color change and rash.  Neurological: Negative for light-headedness and headaches.    Physical Exam Updated Vital Signs BP 99/69 (BP Location: Right Arm)   Pulse 73   Temp 98.9 F (37.2 C) (Oral)   Resp 18   Wt 108.4 kg   SpO2  94%   BMI 32.41 kg/m   Physical Exam Vitals and nursing note reviewed.  Constitutional:      General: He is not in acute distress.    Appearance: Normal appearance.  HENT:     Head: Normocephalic and atraumatic.     Nose: No rhinorrhea.  Eyes:     General:        Right eye: No discharge.        Left eye: No discharge.  Conjunctiva/sclera: Conjunctivae normal.  Cardiovascular:     Rate and Rhythm: Normal rate and regular rhythm.  Pulmonary:     Effort: Pulmonary effort is normal.     Breath sounds: No stridor. No rales.  Abdominal:     General: Abdomen is flat. There is no distension.     Palpations: Abdomen is soft.  Musculoskeletal:        General: No deformity or signs of injury.     Right lower leg: No edema.     Left lower leg: No edema.  Skin:    General: Skin is warm and dry.  Neurological:     General: No focal deficit present.     Mental Status: He is alert. Mental status is at baseline.     Motor: No weakness.  Psychiatric:        Mood and Affect: Mood normal.        Behavior: Behavior normal.        Thought Content: Thought content normal.     ED Results / Procedures / Treatments   Labs (all labs ordered are listed, but only abnormal results are displayed) Labs Reviewed  RESP PANEL BY RT PCR (RSV, FLU A&B, COVID) - Abnormal; Notable for the following components:      Result Value   SARS Coronavirus 2 by RT PCR POSITIVE (*)    All other components within normal limits  BASIC METABOLIC PANEL - Abnormal; Notable for the following components:   Chloride 97 (*)    Glucose, Bld 117 (*)    Creatinine, Ser 1.74 (*)    GFR calc non Af Amer 40 (*)    GFR calc Af Amer 47 (*)    All other components within normal limits  CBC - Abnormal; Notable for the following components:   RBC 5.98 (*)    Hemoglobin 12.9 (*)    MCV 69.9 (*)    MCH 21.6 (*)    RDW 16.6 (*)    All other components within normal limits  BRAIN NATRIURETIC PEPTIDE - Abnormal; Notable for  the following components:   B Natriuretic Peptide 137.4 (*)    All other components within normal limits  D-DIMER, QUANTITATIVE (NOT AT [facility]) - Abnormal; Notable for the following components:   D-Dimer, Quant 10.09 (*)    All other components within normal limits  TROPONIN I (HIGH SENSITIVITY) - Abnormal; Notable for the following components:   Troponin I (High Sensitivity) 38 (*)    All other components within normal limits  TROPONIN I (HIGH SENSITIVITY)    EKG None  Radiology DG Chest Port 1 View  Result Date: 07/19/2020 CLINICAL DATA:  Fever, cough, congestion, chills EXAM: PORTABLE CHEST 1 VIEW COMPARISON:  12/28/2016 FINDINGS: Single frontal view of the chest demonstrates an enlarged cardiac silhouette. There is diffuse interstitial prominence with patchy bilateral ground-glass airspace disease. No effusion or pneumothorax. IMPRESSION: 1. Interstitial and ground-glass opacities, most consistent with COVID-19 pneumonia. Interstitial edema could give a similar pattern. Electronically Signed   By: Randa Ngo M.D.   On: 07/19/2020 18:22    Procedures Procedures (including critical care time)  Medications Ordered in ED Medications  iohexol (OMNIPAQUE) 350 MG/ML injection 100 mL (has no administration in time range)  lactated ringers bolus 500 mL (has no administration in time range)  aspirin chewable tablet 324 mg (324 mg Oral Given 07/19/20 2221)  lactated ringers bolus 500 mL (500 mLs Intravenous New Bag/Given 07/19/20 2223)    ED Course  I  have reviewed the triage vital signs and the nursing notes.  Pertinent labs & imaging results that were available during my care of the patient were reviewed by me and considered in my medical decision making (see chart for details).    MDM Rules/Calculators/A&P                          Well-appearing 64 year old male comes in with chief complaint of losing his breath while trying to fall asleep, decreased oral intake, history of  congestive heart failure secondary to fluid overload with hypertension, not hypertensive.  No clinical signs of fluid overload.  Will get chest x-ray imaging, will get EKG cardiac biomarkers.  Is dealing with COVID-19, this may just be symptom related that he is very congested and has a dry cough.  Chest x-ray shows no acute pulmonary pathology other than multifocal inflammatory changes consistent with Covid,  I evaluated the laboratory studies and there are concerning elevation in his troponin, as well as his creatinine.  He commented on poor nutrition and hydration last few days gentle hydration is given.  No significant clinical signs of fluid overload however mildly elevated BNP.  This may be a chronic state, I have nothing old to compare to.  In the setting of Covid with elevated troponin and elevated creatinine he will likely need admission for further observation and serial lab testing.  He remains comfortable breathing on room air, and hemodynamically stable.  The patient will be admitted to the hospitalist.  For the remainder this patient's care please see inpatient team notes.  I will intervene as needed while the patient remains in the emergency department.   Final Clinical Impression(s) / ED Diagnoses Final diagnoses:  HTN (hypertension)    Rx / DC Orders ED Discharge Orders    None       Breck Coons, MD 07/19/20 2304

## 2020-07-19 NOTE — ED Triage Notes (Signed)
Patient states that he has been "sick" for the last 15 days with " COVID symptoms" for the last 15 days. He states that he had body aches, cough, chills aches. Denies a fever. He reports that he thinks that maybe some of the meds may have caused his BP to elevate. The patient reports that when he dozes off he feels like he stops breathing, and has very little sleep. The patient reports that he has had this in the past

## 2020-07-19 NOTE — ED Notes (Signed)
Taken to CT.

## 2020-07-20 ENCOUNTER — Inpatient Hospital Stay (HOSPITAL_COMMUNITY): Payer: Medicare Other

## 2020-07-20 DIAGNOSIS — I2510 Atherosclerotic heart disease of native coronary artery without angina pectoris: Secondary | ICD-10-CM | POA: Diagnosis present

## 2020-07-20 DIAGNOSIS — I82402 Acute embolism and thrombosis of unspecified deep veins of left lower extremity: Secondary | ICD-10-CM

## 2020-07-20 DIAGNOSIS — J1282 Pneumonia due to coronavirus disease 2019: Secondary | ICD-10-CM

## 2020-07-20 DIAGNOSIS — I110 Hypertensive heart disease with heart failure: Secondary | ICD-10-CM | POA: Diagnosis present

## 2020-07-20 DIAGNOSIS — I5043 Acute on chronic combined systolic (congestive) and diastolic (congestive) heart failure: Secondary | ICD-10-CM | POA: Diagnosis not present

## 2020-07-20 DIAGNOSIS — I82441 Acute embolism and thrombosis of right tibial vein: Secondary | ICD-10-CM | POA: Diagnosis present

## 2020-07-20 DIAGNOSIS — I712 Thoracic aortic aneurysm, without rupture: Secondary | ICD-10-CM | POA: Diagnosis present

## 2020-07-20 DIAGNOSIS — Z833 Family history of diabetes mellitus: Secondary | ICD-10-CM | POA: Diagnosis not present

## 2020-07-20 DIAGNOSIS — Z8249 Family history of ischemic heart disease and other diseases of the circulatory system: Secondary | ICD-10-CM | POA: Diagnosis not present

## 2020-07-20 DIAGNOSIS — Z841 Family history of disorders of kidney and ureter: Secondary | ICD-10-CM | POA: Diagnosis not present

## 2020-07-20 DIAGNOSIS — I428 Other cardiomyopathies: Secondary | ICD-10-CM | POA: Diagnosis present

## 2020-07-20 DIAGNOSIS — M1990 Unspecified osteoarthritis, unspecified site: Secondary | ICD-10-CM | POA: Diagnosis present

## 2020-07-20 DIAGNOSIS — U071 COVID-19: Secondary | ICD-10-CM | POA: Diagnosis present

## 2020-07-20 DIAGNOSIS — R7989 Other specified abnormal findings of blood chemistry: Secondary | ICD-10-CM

## 2020-07-20 DIAGNOSIS — Z7952 Long term (current) use of systemic steroids: Secondary | ICD-10-CM | POA: Diagnosis not present

## 2020-07-20 DIAGNOSIS — N179 Acute kidney failure, unspecified: Secondary | ICD-10-CM

## 2020-07-20 DIAGNOSIS — Z803 Family history of malignant neoplasm of breast: Secondary | ICD-10-CM | POA: Diagnosis not present

## 2020-07-20 DIAGNOSIS — E119 Type 2 diabetes mellitus without complications: Secondary | ICD-10-CM | POA: Diagnosis present

## 2020-07-20 DIAGNOSIS — R778 Other specified abnormalities of plasma proteins: Secondary | ICD-10-CM | POA: Diagnosis not present

## 2020-07-20 DIAGNOSIS — I5022 Chronic systolic (congestive) heart failure: Secondary | ICD-10-CM | POA: Diagnosis present

## 2020-07-20 DIAGNOSIS — Z7984 Long term (current) use of oral hypoglycemic drugs: Secondary | ICD-10-CM | POA: Diagnosis not present

## 2020-07-20 DIAGNOSIS — Z79899 Other long term (current) drug therapy: Secondary | ICD-10-CM | POA: Diagnosis not present

## 2020-07-20 DIAGNOSIS — Z8042 Family history of malignant neoplasm of prostate: Secondary | ICD-10-CM | POA: Diagnosis not present

## 2020-07-20 DIAGNOSIS — I422 Other hypertrophic cardiomyopathy: Secondary | ICD-10-CM | POA: Diagnosis present

## 2020-07-20 DIAGNOSIS — I10 Essential (primary) hypertension: Secondary | ICD-10-CM | POA: Diagnosis present

## 2020-07-20 DIAGNOSIS — M109 Gout, unspecified: Secondary | ICD-10-CM | POA: Diagnosis present

## 2020-07-20 LAB — PROCALCITONIN: Procalcitonin: <0.1 ng/mL

## 2020-07-20 LAB — CBC WITH DIFFERENTIAL/PLATELET
Abs Immature Granulocytes: 0.07 10*3/uL (ref 0.00–0.07)
Basophils Absolute: 0 10*3/uL (ref 0.0–0.1)
Basophils Relative: 0 %
Eosinophils Absolute: 0.1 10*3/uL (ref 0.0–0.5)
Eosinophils Relative: 2 %
HCT: 39.9 % (ref 39.0–52.0)
Hemoglobin: 12.3 g/dL — ABNORMAL LOW (ref 13.0–17.0)
Immature Granulocytes: 1 %
Lymphocytes Relative: 11 %
Lymphs Abs: 0.6 10*3/uL — ABNORMAL LOW (ref 0.7–4.0)
MCH: 21.5 pg — ABNORMAL LOW (ref 26.0–34.0)
MCHC: 30.8 g/dL (ref 30.0–36.0)
MCV: 69.8 fL — ABNORMAL LOW (ref 80.0–100.0)
Monocytes Absolute: 0.5 10*3/uL (ref 0.1–1.0)
Monocytes Relative: 9 %
Neutro Abs: 4.5 10*3/uL (ref 1.7–7.7)
Neutrophils Relative %: 77 %
Platelets: UNDETERMINED 10*3/uL (ref 150–400)
RBC: 5.72 MIL/uL (ref 4.22–5.81)
RDW: 16.8 % — ABNORMAL HIGH (ref 11.5–15.5)
WBC: 5.9 10*3/uL (ref 4.0–10.5)
nRBC: 0 % (ref 0.0–0.2)

## 2020-07-20 LAB — MRSA PCR SCREENING: MRSA by PCR: NEGATIVE

## 2020-07-20 LAB — ABO/RH: ABO/RH(D): B POS

## 2020-07-20 LAB — FERRITIN: Ferritin: 207 ng/mL (ref 24–336)

## 2020-07-20 LAB — COMPREHENSIVE METABOLIC PANEL
ALT: 44 U/L (ref 0–44)
AST: 48 U/L — ABNORMAL HIGH (ref 15–41)
Albumin: 2.9 g/dL — ABNORMAL LOW (ref 3.5–5.0)
Alkaline Phosphatase: 37 U/L — ABNORMAL LOW (ref 38–126)
Anion gap: 13 (ref 5–15)
BUN: 18 mg/dL (ref 8–23)
CO2: 24 mmol/L (ref 22–32)
Calcium: 9 mg/dL (ref 8.9–10.3)
Chloride: 102 mmol/L (ref 98–111)
Creatinine, Ser: 1 mg/dL (ref 0.61–1.24)
GFR calc Af Amer: >60 mL/min (ref >60)
GFR calc non Af Amer: >60 mL/min (ref >60)
Glucose, Bld: 117 mg/dL — ABNORMAL HIGH (ref 70–99)
Potassium: 4.2 mmol/L (ref 3.5–5.1)
Sodium: 139 mmol/L (ref 135–145)
Total Bilirubin: 0.8 mg/dL (ref 0.3–1.2)
Total Protein: 6.7 g/dL (ref 6.5–8.1)

## 2020-07-20 LAB — TROPONIN I (HIGH SENSITIVITY): Troponin I (High Sensitivity): 36 ng/L — ABNORMAL HIGH (ref <18)

## 2020-07-20 LAB — MAGNESIUM: Magnesium: 1.8 mg/dL (ref 1.7–2.4)

## 2020-07-20 LAB — PHOSPHORUS: Phosphorus: 3.5 mg/dL (ref 2.5–4.6)

## 2020-07-20 LAB — LACTATE DEHYDROGENASE: LDH: 273 U/L — ABNORMAL HIGH (ref 98–192)

## 2020-07-20 LAB — C-REACTIVE PROTEIN: CRP: 8.6 mg/dL — ABNORMAL HIGH (ref <1.0)

## 2020-07-20 LAB — HIV ANTIBODY (ROUTINE TESTING W REFLEX): HIV Screen 4th Generation wRfx: NONREACTIVE

## 2020-07-20 MED ORDER — HYDROCOD POLST-CPM POLST ER 10-8 MG/5ML PO SUER: 5.0000 mL | Freq: Two times a day (BID) | ORAL | Status: DC | PRN

## 2020-07-20 MED ORDER — ENOXAPARIN SODIUM 100 MG/ML ~~LOC~~ SOLN
1.0000 mg/kg | Freq: Two times a day (BID) | SUBCUTANEOUS | Status: DC
  Administered 2020-07-20 – 2020-07-21 (×2): 100 mg via SUBCUTANEOUS
  Filled 2020-07-20 (×2): qty 1

## 2020-07-20 MED ORDER — ACETAMINOPHEN 325 MG PO TABS
650.0000 mg | ORAL_TABLET | Freq: Four times a day (QID) | ORAL | Status: DC | PRN
  Administered 2020-07-20 – 2020-07-21 (×3): 650 mg via ORAL
  Filled 2020-07-20 (×3): qty 2

## 2020-07-20 MED ORDER — GUAIFENESIN-DM 100-10 MG/5ML PO SYRP: 10.0000 mL | ORAL_SOLUTION | ORAL | Status: DC | PRN

## 2020-07-20 MED ORDER — ASCORBIC ACID 500 MG PO TABS
500.0000 mg | ORAL_TABLET | Freq: Every day | ORAL | Status: DC
  Administered 2020-07-20 – 2020-07-21 (×2): 500 mg via ORAL
  Filled 2020-07-20 (×2): qty 1

## 2020-07-20 MED ORDER — CHLORHEXIDINE GLUCONATE CLOTH 2 % EX PADS
6.0000 | MEDICATED_PAD | Freq: Every day | CUTANEOUS | Status: DC
  Administered 2020-07-20 – 2020-07-21 (×2): 6 via TOPICAL

## 2020-07-20 MED ORDER — AMLODIPINE BESYLATE 10 MG PO TABS
10.0000 mg | ORAL_TABLET | Freq: Every day | ORAL | Status: DC
  Administered 2020-07-20 – 2020-07-21 (×2): 10 mg via ORAL
  Filled 2020-07-20 (×2): qty 1

## 2020-07-20 MED ORDER — DEXAMETHASONE SODIUM PHOSPHATE 10 MG/ML IJ SOLN
6.0000 mg | INTRAMUSCULAR | Status: DC
  Administered 2020-07-20 – 2020-07-21 (×2): 6 mg via INTRAVENOUS
  Filled 2020-07-20 (×2): qty 1

## 2020-07-20 MED ORDER — ENSURE ENLIVE PO LIQD
237.0000 mL | Freq: Two times a day (BID) | ORAL | Status: DC
  Administered 2020-07-21: 237 mL via ORAL

## 2020-07-20 MED ORDER — ENOXAPARIN SODIUM 100 MG/ML ~~LOC~~ SOLN
1.0000 mg/kg | Freq: Once | SUBCUTANEOUS | Status: AC
  Administered 2020-07-20: 100 mg via SUBCUTANEOUS
  Filled 2020-07-20: qty 1

## 2020-07-20 MED ORDER — ZINC SULFATE 220 (50 ZN) MG PO CAPS
220.0000 mg | ORAL_CAPSULE | Freq: Every day | ORAL | Status: DC
  Administered 2020-07-20 – 2020-07-21 (×2): 220 mg via ORAL
  Filled 2020-07-20 (×2): qty 1

## 2020-07-20 MED ORDER — ENALAPRIL MALEATE 10 MG PO TABS
20.0000 mg | ORAL_TABLET | Freq: Two times a day (BID) | ORAL | Status: DC
  Administered 2020-07-20 – 2020-07-21 (×2): 20 mg via ORAL
  Filled 2020-07-20 (×2): qty 2

## 2020-07-20 MED ORDER — SODIUM CHLORIDE 0.9 % IV SOLN
100.0000 mg | Freq: Every day | INTRAVENOUS | Status: DC
  Administered 2020-07-21: 100 mg via INTRAVENOUS
  Filled 2020-07-20 (×2): qty 20

## 2020-07-20 MED ORDER — ONDANSETRON HCL 4 MG/2ML IJ SOLN: 4.0000 mg | Freq: Four times a day (QID) | INTRAMUSCULAR | Status: DC | PRN

## 2020-07-20 MED ORDER — ONDANSETRON HCL 4 MG PO TABS: 4.0000 mg | ORAL_TABLET | Freq: Four times a day (QID) | ORAL | Status: DC | PRN

## 2020-07-20 MED ORDER — ZOLPIDEM TARTRATE 5 MG PO TABS: 5.0000 mg | ORAL_TABLET | Freq: Every evening | ORAL | Status: DC | PRN

## 2020-07-20 MED ORDER — FUROSEMIDE 40 MG PO TABS
40.0000 mg | ORAL_TABLET | Freq: Every day | ORAL | Status: DC
  Administered 2020-07-20 – 2020-07-21 (×2): 40 mg via ORAL
  Filled 2020-07-20 (×2): qty 1

## 2020-07-20 MED ORDER — CARVEDILOL 12.5 MG PO TABS
50.0000 mg | ORAL_TABLET | Freq: Two times a day (BID) | ORAL | Status: DC
  Administered 2020-07-20 – 2020-07-21 (×2): 50 mg via ORAL
  Filled 2020-07-20 (×2): qty 4

## 2020-07-20 MED ORDER — PANTOPRAZOLE SODIUM 40 MG PO TBEC
40.0000 mg | DELAYED_RELEASE_TABLET | Freq: Every day | ORAL | Status: DC
  Administered 2020-07-20 – 2020-07-21 (×2): 40 mg via ORAL
  Filled 2020-07-20 (×2): qty 1

## 2020-07-20 MED ORDER — ORAL CARE MOUTH RINSE
15.0000 mL | Freq: Two times a day (BID) | OROMUCOSAL | Status: DC
  Administered 2020-07-20 – 2020-07-21 (×3): 15 mL via OROMUCOSAL

## 2020-07-20 MED ORDER — SODIUM CHLORIDE 0.9 % IV SOLN
200.0000 mg | Freq: Once | INTRAVENOUS | Status: AC
  Administered 2020-07-20: 200 mg via INTRAVENOUS
  Filled 2020-07-20: qty 200

## 2020-07-20 MED ORDER — IPRATROPIUM-ALBUTEROL 20-100 MCG/ACT IN AERS
1.0000 | INHALATION_SPRAY | Freq: Four times a day (QID) | RESPIRATORY_TRACT | Status: DC
  Administered 2020-07-20 – 2020-07-21 (×4): 1 via RESPIRATORY_TRACT
  Filled 2020-07-20: qty 4

## 2020-07-20 NOTE — H&P (Signed)
History and Physical    Craig Prince:[id number] DOB: 1956-07-23 DOA: 07/19/2020  PCP: Merrilee Seashore, MD Patient coming from: Home  Chief Complaint: Shortness of breath  HPI: Craig Prince is a 64 y.o. male with history of 64 year old male with history of combined CHF/NIDDM, aortic root enlargement, nonobstructive CAD, HTN and gout presenting to [facility] ED with acute shortness of breath.  Patient has had Covid symptoms including loss of taste and smell, myalgia, poor appetite, productive cough with whitish phlegm, fatigue, chills and intermittent loose stool for about 3 weeks.  He started feeling short of breath 2 days ago that has gradually gotten worse.  Breathing is worse with exertion.  He denies chest pain.  Denies nausea or vomiting.  Denies UTI symptoms.  He admits to mild headache.  Denies focal neuro symptoms.  He denies known sick contacts or COVID-19 exposure.  He is not vaccinated against COVID-19. He reports good compliance with his medications including fluid pills.  He denies orthopnea, PND or edema.  Patient denies smoking cigarettes, drinking alcohol recreational drug use.  In ED, COVID-19 PCR positive.  Hemodynamically stable.  Saturating in 90s on RA. Cr 1.74 (unknown baseline).  BUN 19.  Hgb 12.6.  BNP 137.  Troponin 38> 36.  EKG NSR with nonspecific IVCD/LAD.  D-dimer 10.09.  Initially started on IV heparin.  However, CTA chest negative for PE but bilateral groundglass opacities.  Anticoagulation discontinued.  Received LR boluses and full dose aspirin.  Admission accepted by overnight admitter, and he was transferred to [facility].   ROS All review of system negative except for pertinent positives and negatives as history of present illness above.  PMH Past Medical History:  Diagnosis Date  . Arthritis   . CAD (coronary artery disease)    NONOBSTRUCTIVE  a. cath 8/10: prox to mid LAD 20%  . Chronic systolic heart failure ([place])   . Essential hypertension,  benign   . Family history of breast cancer   . Family history of prostate cancer   . Gout   . Hypertrophic nonobstructive cardiomyopathy ([employer])    septal hypertrophy without LVOT  . NICM (nonischemic cardiomyopathy) ([place])    a. echo 8/10: EF20-25%, mod LAE, severe asymmetric septal hypertrophy (? nonobs. HCM); grade 1 diast dyfxn.  EF 55% echo June 6   [facility] Past Surgical History:  Procedure Laterality Date  . KNEE ARTHROSCOPY     right   Fam HX Family History  Problem Relation Age of Onset  . Kidney disease Brother   . Cancer Brother        prostate  . Kidney disease Father   . Diabetes Mother   . Heart attack Mother 26  . Heart disease Mother   . Cancer Sister        breast  . CAD Brother 16       60% blockage  . Breast cancer Sister 45  . Breast cancer Sister 4       PALB2 pos  . Healthy Son   . Healthy Daughter     Social Hx  reports that he has never smoked. He has never used smokeless tobacco. He reports that he does not drink alcohol and does not use drugs.  Allergy No Known Allergies Home Meds Prior to Admission medications   Medication Sig Start Date End Date Taking? Authorizing Provider  acetaminophen (TYLENOL) 325 MG tablet Take 2 tablets (650 mg total) by mouth every 4 (four) hours as needed for headache  or mild pain. 12/29/16  Yes Kilroy, Luke K, PA-C  amLODipine (NORVASC) 10 MG tablet Take 1 tablet (10 mg total) by mouth daily. 08/07/18  Yes Minus Breeding, MD  carvedilol (COREG) 25 MG tablet Take 2 tablets (50 mg total) by mouth 2 (two) times daily with a meal. Please schedule annual appt with Dr. Percival Spanish for refills. [phone]. 3rd attempt. 04/08/20  Yes Minus Breeding, MD  Cholecalciferol (VITAMIN D-3 PO) Take 1 tablet by mouth daily.   Yes [provider]  CINNAMON PO Take 1 tablet by mouth daily.     Yes [provider]  enalapril (VASOTEC) 20 MG tablet Take 1 tablet (20 mg total) by mouth 2 (two) times daily. 08/29/19  Yes  Minus Breeding, MD  furosemide (LASIX) 40 MG tablet Take 1 tablet (40 mg total) by mouth daily. 08/07/18  Yes Minus Breeding, MD  Garlic TABS Take 1 tablet by mouth daily.     Yes [provider]  zolpidem (AMBIEN) 10 MG tablet Take 1 tablet (10 mg total) by mouth at bedtime as needed for sleep. 08/07/18 06/12/22 Yes Minus Breeding, MD  colchicine 0.6 MG tablet Take 2 tablets now, repeat 1 tablet 1 hour later. Patient not taking: Reported on 07/20/2020 01/07/16   Melony Overly, MD  HYDROcodone-acetaminophen [facility]) 5-325 MG tablet Take 1-2 tablets by mouth every 6 (six) hours as needed. Patient not taking: Reported on 07/20/2020 12/07/19   Veryl Speak, MD    Physical Exam: Vitals:   07/20/20 1200 07/20/20 1300 07/20/20 1400 07/20/20 1505  BP:    (!) 144/88  Pulse: 81 72 (!) 103 89  Resp: 17 16 18 16   Temp:      TempSrc:      SpO2: 100% 95% 96% 95%  Weight:      Height:        GENERAL: No acute distress.  Appears well.  HEENT: MMM.  Vision and hearing grossly intact.  NECK: Supple.  No apparent JVD.  RESP: On RA.  No IWOB. Good air movement bilaterally. CVS:  RRR. Heart sounds normal.  ABD/GI/GU: Bowel sounds present. Soft. Non tender.  MSK/EXT:  Moves extremities. No apparent deformity or edema.  SKIN: no apparent skin lesion or wound NEURO: Awake, alert and oriented appropriately.  No gross deficit.  PSYCH: Calm. Normal affect.    Personally Reviewed Radiological Exams CT Angio Chest PE W and/or Wo Contrast  Result Date: 07/19/2020 CLINICAL DATA:  Elevated D-dimer, chest pain and shortness of breath, COVID-19 positive EXAM: CT ANGIOGRAPHY CHEST WITH CONTRAST TECHNIQUE: Multidetector CT imaging of the chest was performed using the standard protocol during bolus administration of intravenous contrast. Multiplanar CT image reconstructions and MIPs were obtained to evaluate the vascular anatomy. CONTRAST:  164m OMNIPAQUE IOHEXOL 350 MG/ML SOLN COMPARISON:  07/19/2020,  11/27/2019 FINDINGS: Cardiovascular: This is a technically adequate evaluation of the pulmonary vasculature. No filling defects or pulmonary emboli. Left ventricular hypertrophy is noted. There is mild aneurysmal dilatation of the descending thoracic aorta measuring 4.1 cm. No dissection. Mild atherosclerosis of the aortic arch. Mediastinum/Nodes: No enlarged mediastinal, hilar, or axillary lymph nodes. Thyroid gland, trachea, and esophagus demonstrate no significant findings. Lungs/Pleura: There is multifocal bilateral ground-glass airspace disease primarily in a subpleural distribution, consistent with multifocal pneumonia and COVID-19. No effusion or pneumothorax. The central airways are patent. Upper Abdomen: No acute abnormality. Musculoskeletal: No acute or destructive bony lesions. Reconstructed images demonstrate no additional findings. Review of the MIP images confirms the above findings.  IMPRESSION: 1. No evidence of pulmonary embolus. 2. Multifocal bilateral ground-glass airspace disease consistent with multifocal pneumonia and COVID-19. 3. Mild aneurysmal dilatation of the descending thoracic aorta measuring up to 4.1 cm. No dissection. Recommend semi-annual imaging followup by CTA or MRA and referral to cardiothoracic surgery if not already obtained. This recommendation follows 2010 ACCF/AHA/AATS/ACR/ASA/SCA/SCAI/SIR/STS/SVM Guidelines for the Diagnosis and Management of Patients With Thoracic Aortic Disease. Circulation. 2010; 121: P295-J88. Aortic aneurysm NOS (ICD10-I71.9) 4. Aortic Atherosclerosis (ICD10-I70.0). Electronically Signed   By: Randa Ngo M.D.   On: 07/19/2020 23:29   DG Chest Port 1 View  Result Date: 07/19/2020 CLINICAL DATA:  Fever, cough, congestion, chills EXAM: PORTABLE CHEST 1 VIEW COMPARISON:  12/28/2016 FINDINGS: Single frontal view of the chest demonstrates an enlarged cardiac silhouette. There is diffuse interstitial prominence with patchy bilateral ground-glass  airspace disease. No effusion or pneumothorax. IMPRESSION: 1. Interstitial and ground-glass opacities, most consistent with COVID-19 pneumonia. Interstitial edema could give a similar pattern. Electronically Signed   By: Randa Ngo M.D.   On: 07/19/2020 18:22   VAS [place] LOWER EXTREMITY VENOUS (DVT)  Result Date: 07/20/2020  Lower Venous DVTStudy Indications: COVID-19 infection. Elevated d-dimer.  Comparison Study: No prior study Performing Technologist: Maudry Mayhew MHA, RDMS, RVT, RDCS  Examination Guidelines: A complete evaluation includes B-mode imaging, spectral Doppler, color Doppler, and power Doppler as needed of all accessible portions of each vessel. Bilateral testing is considered an integral part of a complete examination. Limited examinations for reoccurring indications may be performed as noted. The reflux portion of the exam is performed with the patient in reverse Trendelenburg.  +---------+---------------+---------+-----------+----------+--------------+ RIGHT    CompressibilityPhasicitySpontaneityPropertiesThrombus Aging +---------+---------------+---------+-----------+----------+--------------+ CFV      Full           Yes      Yes                                 +---------+---------------+---------+-----------+----------+--------------+ SFJ      Full                                                        +---------+---------------+---------+-----------+----------+--------------+ FV Prox  Full                                                        +---------+---------------+---------+-----------+----------+--------------+ FV Mid   Full                                                        +---------+---------------+---------+-----------+----------+--------------+ FV DistalFull                                                        +---------+---------------+---------+-----------+----------+--------------+ PFV      Full                                                         +---------+---------------+---------+-----------+----------+--------------+  POP      Full           Yes      Yes                                 +---------+---------------+---------+-----------+----------+--------------+ PTV      None                    No                   Acute          +---------+---------------+---------+-----------+----------+--------------+ PERO     Full                                                        +---------+---------------+---------+-----------+----------+--------------+   +---------+---------------+---------+-----------+----------+--------------+ LEFT     CompressibilityPhasicitySpontaneityPropertiesThrombus Aging +---------+---------------+---------+-----------+----------+--------------+ CFV      Full           Yes      Yes                                 +---------+---------------+---------+-----------+----------+--------------+ SFJ      Full                                                        +---------+---------------+---------+-----------+----------+--------------+ FV Prox  Full                                                        +---------+---------------+---------+-----------+----------+--------------+ FV Mid   Full                                                        +---------+---------------+---------+-----------+----------+--------------+ FV DistalFull                                                        +---------+---------------+---------+-----------+----------+--------------+ PFV      Full                                                        +---------+---------------+---------+-----------+----------+--------------+ POP      Full           Yes      Yes                                 +---------+---------------+---------+-----------+----------+--------------+  PTV      Full                                                         +---------+---------------+---------+-----------+----------+--------------+ PERO     Full                                                        +---------+---------------+---------+-----------+----------+--------------+     Summary: RIGHT: - Findings consistent with acute deep vein thrombosis involving a single right posterior tibial vein. - No cystic structure found in the popliteal fossa.  LEFT: - There is no evidence of deep vein thrombosis in the lower extremity.  - No cystic structure found in the popliteal fossa.  *See table(s) above for measurements and observations.    Preliminary      Personally Reviewed Labs: CBC: Recent Labs  Lab 07/19/20 2040 07/20/20 1151  WBC 7.4 5.9  NEUTROABS  --  4.5  HGB 12.9* 12.3*  HCT 41.8 39.9  MCV 69.9* 69.8*  PLT 225 PLATELET CLUMPS NOTED ON SMEAR, UNABLE TO ESTIMATE   Basic Metabolic Panel: Recent Labs  Lab 07/19/20 2040 07/20/20 1151  NA 138 139  K 3.7 4.2  CL 97* 102  CO2 26 24  GLUCOSE 117* 117*  BUN 19 18  CREATININE 1.74* 1.00  CALCIUM 8.9 9.0  MG  --  1.8  PHOS  --  3.5   GFR: Estimated Creatinine Clearance: 90.4 mL/min (by C-G formula based on SCr of 1 mg/dL). Liver Function Tests: Recent Labs  Lab 07/20/20 1151  AST 48*  ALT 44  ALKPHOS 37*  BILITOT 0.8  PROT 6.7  ALBUMIN 2.9*   No results for input(s): LIPASE, AMYLASE in the last 168 hours. No results for input(s): AMMONIA in the last 168 hours. Coagulation Profile: No results for input(s): INR, PROTIME in the last 168 hours. Cardiac Enzymes: No results for input(s): CKTOTAL, CKMB, CKMBINDEX, TROPONINI in the last 168 hours. BNP (last 3 results) No results for input(s): PROBNP in the last 8760 hours. HbA1C: No results for input(s): HGBA1C in the last 72 hours. CBG: No results for input(s): GLUCAP in the last 168 hours. Lipid Profile: No results for input(s): CHOL, HDL, LDLCALC, TRIG, CHOLHDL, LDLDIRECT in the last 72 hours. Thyroid Function  Tests: No results for input(s): TSH, T4TOTAL, FREET4, T3FREE, THYROIDAB in the last 72 hours. Anemia Panel: Recent Labs    07/20/20 1151  FERRITIN 207   Urine analysis:    Component Value Date/Time   COLORURINE YELLOW 07/26/2009 [place] 07/26/2009 2221   LABSPEC 1.025 07/26/2009 2221   PHURINE 5.5 07/26/2009 2221   GLUCOSEU NEGATIVE 07/26/2009 2221   HGBUR SMALL (A) 07/26/2009 2221   BILIRUBINUR NEGATIVE 07/26/2009 2221   KETONESUR NEGATIVE 07/26/2009 2221   PROTEINUR >300 (A) 07/26/2009 2221   UROBILINOGEN 0.2 07/26/2009 2221   NITRITE NEGATIVE 07/26/2009 2221   LEUKOCYTESUR NEGATIVE 07/26/2009 2221    Sepsis Labs:  None  Personally Reviewed EKG:  12-lead EKG with normal sinus rhythm and nonspecific IVCD/LAD.  Assessment/Plan Active Problems:   Pneumonia due to COVID-19 virus Symptomatic for about 3 weeks but  has worsening dyspnea over the last 2-3 days.  CTA chest negative for PE but with bilateral infiltrates.  Inflammatory markers elevated.  Recent Labs    07/19/20 2040 07/20/20 1151  DDIMER 10.09*  --   FERRITIN  --  207  LDH  --  273*  CRP  --  8.6*  -Check procalcitonin. -Start Decadron and remdesivir given acute dyspnea.   -Verbally consented to baricitinib if needed.  No contraindication. -On full dose Lovenox for lower extremity DVT -Trend inflammatory markers -Supportive care with inhalers, mucolytic's, antitussive, IS -OOB/PT/OT  Right lower extremity DVT-likely provoked by COVID-19 -Full dose Lovenox  Chronic combined CHF/NICM with recovered EF: Equal in 2019 with EF of 50 to 55%.  BNP 137.  Appears euvolemic on exam.  He is on Lasix at home. -Continue home Lasix -Monitor fluid status and renal functions  AKI: Cr 1.74 (unknown baseline).  Could be ATN due to COVID-19.  AKI improved. -Continue monitoring  Elevated troponin/history of nonobstructive CAD: Likely demand ischemia and partly delayed clearance due to renal failure.   EKG without acute ischemic finding. -Continue home Coreg  Descending thoracic aortic aneurysm: Measures 4.1 cm on CTA chest. -Needs semiannual evaluation either by CTA or MRA.  Essential hypertension: Normotensive for most part. -Continue on Coreg and amlodipine   DVT prophylaxis: On full dose Lovenox for DVT  Code Status: Full code Family Communication: Patient declined updating his son. Disposition Plan: Admitted to medical telemetry Consults called: None Admission status: Inpatient   Mercy Riding MD [facility]  If 7PM-7AM, please contact night-coverage [email or web address]  07/20/2020, 3:29 PM

## 2020-07-20 NOTE — ED Notes (Signed)
Pt up to bedside commode, NAD

## 2020-07-20 NOTE — ED Notes (Signed)
Placed patient on 2L [place] due to decrease oxygen saturations. Patients' oxygen saturations increased to 94%. Will continue to monitor and make changes as necessary. Patient tolerated well.

## 2020-07-20 NOTE — Progress Notes (Signed)
Bilateral lower extremity venous duplex completed. Refer to "CV Proc" under chart review to view preliminary results.  07/20/2020 1:56 PM Eula Fried., MHA, RVT, RDCS, RDMS

## 2020-07-20 NOTE — Progress Notes (Signed)
ANTICOAGULATION CONSULT NOTE - Initial Consult  Pharmacy Consult for SQ Lovenox Indication: DVT  No Known Allergies  Patient Measurements: Height: 6' (182.9 cm) Weight: 100.7 kg (222 lb 0.1 oz) IBW/kg (Calculated) : 77.6  Vital Signs: BP: 129/61 (08/22 1100) Pulse Rate: 75 (08/22 1100)  Labs: Recent Labs    07/19/20 2040 07/20/20 0041 07/20/20 1151  HGB 12.9*  --  12.3*  HCT 41.8  --  39.9  PLT 225  --  PLATELET CLUMPS NOTED ON SMEAR, UNABLE TO ESTIMATE  CREATININE 1.74*  --  1.00  TROPONINIHS 38* 36*  --     Estimated Creatinine Clearance: 90.4 mL/min (by C-G formula based on SCr of 1 mg/dL).   Medical History: Past Medical History:  Diagnosis Date  . Arthritis   . CAD (coronary artery disease)    NONOBSTRUCTIVE  a. cath 8/10: prox to mid LAD 20%  . Chronic systolic heart failure (HCC)   . Essential hypertension, benign   . Family history of breast cancer   . Family history of prostate cancer   . Gout   . Hypertrophic nonobstructive cardiomyopathy (H[facility])    septal hypertrophy without LVOT  . NICM (nonischemic cardiomyopathy) ([facility])    a. echo 8/10: EF20-25%, mod LAE, severe asymmetric septal hypertrophy (? nonobs. HCM); grade 1 diast dyfxn.  EF 55% echo June 6    Medications:  Scheduled:  . amLODipine  10 mg Oral Daily  . vitamin C  500 mg Oral Daily  . carvedilol  50 mg Oral BID WC  . Chlorhexidine Gluconate Cloth  6 each Topical Daily  . dexamethasone (DECADRON) injection  6 mg Intravenous Q24H  . enalapril  20 mg Oral BID  . feeding supplement (ENSURE ENLIVE)  237 mL Oral BID BM  . furosemide  40 mg Oral Daily  . Ipratropium-Albuterol  1 puff Inhalation Q6H  . mouth rinse  15 mL Mouth Rinse BID  . pantoprazole  40 mg Oral Daily  . zinc sulfate  220 mg Oral Daily   Infusions:    Assessment: 64 yo male presented with cough found to be COVID+ and has been at home until now. Dopplers were done and patient also found to have new DVT to start Lovenox  per pharmacy dosing. Baseline labs drawn.   Goal of Therapy:  Heparin level 0.6-1.2 units/ml Monitor platelets by anticoagulation protocol: Yes   Plan:  Lovenox 1mg /kg SQ q12 Await plan for anticoagulation for discharge when time comes  07/20/2020,2:10 PM

## 2020-07-21 LAB — CBC WITH DIFFERENTIAL/PLATELET
Abs Immature Granulocytes: 0.07 10*3/uL (ref 0.00–0.07)
Basophils Absolute: 0 10*3/uL (ref 0.0–0.1)
Basophils Relative: 0 %
Eosinophils Absolute: 0 10*3/uL (ref 0.0–0.5)
Eosinophils Relative: 0 %
HCT: 38.9 % — ABNORMAL LOW (ref 39.0–52.0)
Hemoglobin: 11.9 g/dL — ABNORMAL LOW (ref 13.0–17.0)
Immature Granulocytes: 1 %
Lymphocytes Relative: 9 %
Lymphs Abs: 0.7 10*3/uL (ref 0.7–4.0)
MCH: 22 pg — ABNORMAL LOW (ref 26.0–34.0)
MCHC: 30.6 g/dL (ref 30.0–36.0)
MCV: 72 fL — ABNORMAL LOW (ref 80.0–100.0)
Monocytes Absolute: 0.4 10*3/uL (ref 0.1–1.0)
Monocytes Relative: 5 %
Neutro Abs: 6.3 10*3/uL (ref 1.7–7.7)
Neutrophils Relative %: 85 %
Platelets: 242 10*3/uL (ref 150–400)
RBC: 5.4 MIL/uL (ref 4.22–5.81)
RDW: 17.1 % — ABNORMAL HIGH (ref 11.5–15.5)
WBC: 7.4 10*3/uL (ref 4.0–10.5)
nRBC: 0 % (ref 0.0–0.2)

## 2020-07-21 LAB — MAGNESIUM: Magnesium: 2.1 mg/dL (ref 1.7–2.4)

## 2020-07-21 LAB — COMPREHENSIVE METABOLIC PANEL
ALT: 44 U/L (ref 0–44)
AST: 40 U/L (ref 15–41)
Albumin: 2.9 g/dL — ABNORMAL LOW (ref 3.5–5.0)
Alkaline Phosphatase: 38 U/L (ref 38–126)
Anion gap: 11 (ref 5–15)
BUN: 20 mg/dL (ref 8–23)
CO2: 21 mmol/L — ABNORMAL LOW (ref 22–32)
Calcium: 8.8 mg/dL — ABNORMAL LOW (ref 8.9–10.3)
Chloride: 102 mmol/L (ref 98–111)
Creatinine, Ser: 0.96 mg/dL (ref 0.61–1.24)
GFR calc Af Amer: >60 mL/min (ref >60)
GFR calc non Af Amer: >60 mL/min (ref >60)
Glucose, Bld: 149 mg/dL — ABNORMAL HIGH (ref 70–99)
Potassium: 4.2 mmol/L (ref 3.5–5.1)
Sodium: 134 mmol/L — ABNORMAL LOW (ref 135–145)
Total Bilirubin: 0.4 mg/dL (ref 0.3–1.2)
Total Protein: 6.9 g/dL (ref 6.5–8.1)

## 2020-07-21 LAB — C-REACTIVE PROTEIN: CRP: 5.9 mg/dL — ABNORMAL HIGH (ref <1.0)

## 2020-07-21 LAB — PROCALCITONIN: Procalcitonin: <0.1 ng/mL

## 2020-07-21 LAB — FERRITIN: Ferritin: 228 ng/mL (ref 24–336)

## 2020-07-21 LAB — LACTATE DEHYDROGENASE: LDH: 228 U/L — ABNORMAL HIGH (ref 98–192)

## 2020-07-21 LAB — D-DIMER, QUANTITATIVE: D-Dimer, Quant: 10.76 ug/mL-FEU — ABNORMAL HIGH (ref 0.00–0.50)

## 2020-07-21 LAB — PHOSPHORUS: Phosphorus: 3.6 mg/dL (ref 2.5–4.6)

## 2020-07-21 MED ORDER — DEXAMETHASONE 6 MG PO TABS: 6.0000 mg | ORAL_TABLET | Freq: Every day | ORAL | 0 refills | Status: AC

## 2020-07-21 MED ORDER — APIXABAN (ELIQUIS) VTE STARTER PACK (10MG AND 5MG): ORAL_TABLET | ORAL | 0 refills | Status: DC

## 2020-07-21 MED ORDER — PROSOURCE PLUS PO LIQD
30.0000 mL | Freq: Two times a day (BID) | ORAL | Status: DC
  Administered 2020-07-21: 30 mL via ORAL
  Filled 2020-07-21: qty 30

## 2020-07-21 MED ORDER — ADULT MULTIVITAMIN W/MINERALS CH
1.0000 | ORAL_TABLET | Freq: Every day | ORAL | Status: DC
  Administered 2020-07-21: 1 via ORAL
  Filled 2020-07-21: qty 1

## 2020-07-21 NOTE — TOC Initial Note (Signed)
Transition of Care [facility]) - Initial/Assessment Note    Patient Details  Name: Craig Prince MRN: [id number] Date of Birth: [birth year]  Transition of Care [facility]) CM/SW Contact:    Golda Acre, RN Phone Number: 07/21/2020, 8:12 AM  Clinical Narrative:                 Unvaccinated male on 2lvia n.c., iv decadron, remdesivir through [id number] , DDIMER AND CRP ELEVATED./lives alone. Has pcp. Should be able to return to home.   Expected Discharge Plan: Home/Self Care Barriers to Discharge: Continued Medical Work up   Patient Goals and CMS Choice Patient states their goals for this hospitalization and ongoing recovery are:: to go home CMS Medicare.gov Compare Post Acute Care list provided to:: Patient    Expected Discharge Plan and Services Expected Discharge Plan: Home/Self Care   Discharge Planning Services: CM Consult   Living arrangements for the past 2 months: Apartment                                      Prior Living Arrangements/Services Living arrangements for the past 2 months: Apartment Lives with:: Self Patient language and need for interpreter reviewed:: Yes Do you feel safe going back to the place where you live?: Yes      Need for Family Participation in Patient Care: Yes (Comment) Care giver support system in place?: Yes (comment)   Criminal Activity/Legal Involvement Pertinent to Current Situation/Hospitalization: No - Comment as needed  Activities of Daily Living Home Assistive Devices/Equipment: Cane (specify quad or straight), Blood pressure cuff ADL Screening (condition at time of admission) Patient's cognitive ability adequate to safely complete daily activities?: No Is the patient deaf or have difficulty hearing?: No Does the patient have difficulty seeing, even when wearing glasses/contacts?: No Does the patient have difficulty concentrating, remembering, or making decisions?: No Patient able to express need for assistance with ADLs?:  Yes Does the patient have difficulty dressing or bathing?: No Independently performs ADLs?: Yes (appropriate for developmental age) Does the patient have difficulty walking or climbing stairs?: Yes Weakness of Legs: Both Weakness of Arms/Hands: None  Permission Sought/Granted                  Emotional Assessment Appearance:: Appears stated age Attitude/Demeanor/Rapport: Apprehensive Affect (typically observed): Calm Orientation: : Oriented to Place, Oriented to Self, Oriented to  Time, Oriented to Situation Alcohol / Substance Use: Not Applicable Psych Involvement: No (comment)  Admission diagnosis:  HTN (hypertension) [I10] Pneumonia due to COVID-19 virus [U07.1, J12.82] Patient Active Problem List   Diagnosis Date Noted  . Pneumonia due to COVID-19 virus 07/19/2020  . Aortic root enlargement (HCC) 10/05/2018  . Onychomycosis 09/20/2018  . Genetic testing 07/27/2018  . Family history of prostate cancer   . Family history of breast cancer   . Troponin level elevated 12/29/2016  . Chest pain with high risk for cardiac etiology 12/28/2016  . Chest pain 12/28/2016  . Left inguinal hernia 01/08/2014  . Insomnia 05/19/2012  . Hypertrophic nonobstructive cardiomyopathy (HCC) 05/04/2011  . Chronic systolic heart failure (HCC)   . Non Obstructive CAD by cath 06/2009   . OBESITY, UNSPECIFIED 10/14/2010  . History of NICM- resolved at last echo 08/20/2009  . Essential hypertension 01/26/2007  . GASTROINTESTINAL HEMORRHAGE 01/26/2007   PCP:  Georgianne Fick, MD Pharmacy:   [id number] - [place], [place] - [address] AT [facility] [address] [place] [address] Phone: [phone] Fax: [phone]  [id number] [place], [place] - [address] AT [facility] OF [address] [place] [address] Phone: [phone] Fax: [phone]     Social Determinants of Health  (SDOH) Interventions    Readmission Risk Interventions No flowsheet data found.

## 2020-07-21 NOTE — Progress Notes (Signed)
Initial Nutrition Assessment  DOCUMENTATION CODES:   Not applicable  INTERVENTION:  - continue Ensure Enlive BID, each supplement provides 350 kcal and 20 grams of protein. - will order 30 ml Prosource Plus BID, each supplement provides 100 kcal and 15 grams protein. - will order 1 tablet multivitamin with minerals. - will complete NFPE in the future if it is determined to be of benefit related to care.   NUTRITION DIAGNOSIS:   Increased nutrient needs related to acute illness, catabolic illness (COVID-19 infection) as evidenced by estimated needs.  GOAL:   Patient will meet greater than or equal to 90% of their needs  MONITOR:   PO intake, Supplement acceptance, Labs, Weight trends  REASON FOR ASSESSMENT:   Malnutrition Screening Tool  ASSESSMENT:   64 y.o. male with medical history of CHF/NIDDM, aortic root enlargement, non-obstructive CAD, HTN, and gout. He presented to the ED with acute shortness of breath. He was experiencing COVID symptoms including: loss of taste and smell, myalgia, poor appetite, productive cough with whitish phlegm, fatigue, chills, and intermittent loose stool for ~3 weeks. He denied N/V PTA. Patient is not COVID-19 vaccinated. In the ED, he was found to be COVID-19 positive.  Patient ate 90% of lunch and 0% of dinner yesterday (720 kcal, 38 grams protein). He is ordered Ensure Enlive BID and was unable to receive it yesterday but was provided a bottle by RN earlier this AM.   Weight yesterday was 222 lb and PTA the most recently documented weight was on 10/05/18 when he weighed 239 lb. Patient currently ordered lasix, takes lasix at home, and noted to have BLE edema. Will monitor weight trends closely.   Per notes: - COVID-19, symptomatic x3 weeks - CT angio showed bilateral infiltrates  - RLE DVT - AKI - descending thoracic aortic aneurysm   Labs reviewed; Na: 134 mmol/l.  Medications reviewed; 500 mg ascorbic acid/day, 40 mg oral lasix/day, 40  mg oral protonix/day, 200 mg IV remdesivir x1 dose 8/22, 100 mg IV remdesivir x1 dose/day (8/23-8/26), 220 mg zinc sulfate/day.     NUTRITION - FOCUSED PHYSICAL EXAM:  not completed at this time.   Diet Order:   Diet Order            Diet Heart Room service appropriate? Yes; Fluid consistency: Thin  Diet effective now                 EDUCATION NEEDS:   No education needs have been identified at this time  Skin:  Skin Assessment: Reviewed RN Assessment  Last BM:  8/22  Height:   Ht Readings from Last 1 Encounters:  07/20/20 6' (1.829 m)    Weight:   Wt Readings from Last 1 Encounters:  07/20/20 100.7 kg    Estimated Nutritional Needs:  Kcal:  2683-4196 kcal Protein:  95-110 grams Fluid:  >/= 1.8 L/day     Trenton Gammon, MS, RD, LDN, CNSC Inpatient Clinical Dietitian RD pager # available in [facility]  After hours/weekend pager # available in [facility]

## 2020-07-21 NOTE — Progress Notes (Signed)
Patient scheduled for outpatient Remdesivir infusions at 9am on Tuesday 8/24, Wednesday 8/25, and Thursday 8/26 at [facility]. Please inform the patient to park at [address], [place], as staff will be escorting the patient through the east entrance of the hospital.    There is a wave flag banner located near the entrance on [address] Turn into this entrance and immediately turn left and park in 1 of the 5 designated Covid Infusion Parking spots. There is a phone number on the sign, please call and let the staff know what spot you are in and we will come out and get you. For questions call [phone].  Thanks.  

## 2020-07-21 NOTE — Progress Notes (Signed)
Taken to vehicle using appropriate covid precautions.  Stable at time of dc.

## 2020-07-21 NOTE — Plan of Care (Signed)
D/c home

## 2020-07-21 NOTE — Discharge Summary (Signed)
Physician Discharge Summary  Brenyn Petrey Bayer ELF:[id number] DOB: Jun 17, 1956 DOA: 07/19/2020  PCP: Georgianne Fick, MD  Admit date: 07/19/2020 Discharge date: 07/21/2020  Admitted From: Home Disposition: Home   Recommendations for Outpatient Follow-up:  1. Follow up with PCP in 1-2 weeks. Continue eliquis for acute right peroneal vein DVT.  2. Continue remdesivir outpatient infusions and decadron after discharge.   Home Health: None Equipment/Devices: None Discharge Condition: Stable CODE STATUS: Full Diet recommendation: Heart healthy, carb-modified  Brief/Interim Summary: MAANAV KASSABIAN is a 64 y.o. male with history of 64 year old male with history of combined CHF/NIDDM, aortic root enlargement, nonobstructive CAD, HTN and gout presenting to [facility] ED with acute shortness of breath.  Patient has had Covid symptoms including loss of taste and smell, myalgia, poor appetite, productive cough with whitish phlegm, fatigue, chills and intermittent loose stool for about 3 weeks.  He started feeling short of breath 2 days ago that has gradually gotten worse.  Breathing is worse with exertion.  He denies chest pain.  Denies nausea or vomiting.  Denies UTI symptoms.  He admits to mild headache.  Denies focal neuro symptoms.  He denies known sick contacts or COVID-19 exposure.  He is not vaccinated against COVID-19. He reports good compliance with his medications including fluid pills.  He denies orthopnea, PND or edema.  Patient denies smoking cigarettes, drinking alcohol recreational drug use.  In ED, COVID-19 PCR positive.  Hemodynamically stable.  Saturating in 90s on RA. Cr 1.74 (unknown baseline).  BUN 19.  Hgb 12.6.  BNP 137.  Troponin 38> 36.  EKG NSR with nonspecific IVCD/LAD.  D-dimer 10.09.  Initially started on IV heparin.  However, CTA chest negative for PE but bilateral groundglass opacities. Lower extremity U/S did show right peroneal vein DVT for which anticoagulation was  started.  He ambulated without dyspnea or hypoxia on 8/23 and has appointments to continue remdesivir infusions as an outpatient. Discharged in stable condition.  Discharge Diagnoses:  Active Problems:   Pneumonia due to COVID-19 virus   Discharge Instructions Discharge Instructions    Diet - low sodium heart healthy   Complete by: As directed    Discharge instructions   Complete by: As directed    You are being discharged from the hospital after treatment for covid-19 infection. You are felt to be stable enough to no longer require inpatient monitoring, testing, and treatment, though you will need to follow the recommendations below: - Continue taking decadron (steroid) for 8 more days starting tomorrow morning.  - Continue taking eliquis (blood thinner) for the blood clot in your right leg. You will need to follow up with your PCP for refills and seek medical attention right away as we discussed if you notice uncontrolled bleeding.  - Return to [facility] for the next 3 days for infusions of remdesivir (directions provided), first appointment is tomorrow at [facility]. - Per CDC guidelines, you will need to remain in isolation for 21 days from your positive covid test. - Follow up with your doctor in the next week via telehealth or seek medical attention right away if your symptoms get WORSE.  - You are still encouraged to get a covid vaccination between 21 days (after isolation period ends) and 90 days (before immunity is thought to wear off).  Directions for you at home:  Wear a facemask You should wear a facemask that covers your nose and mouth when you are in the same room with other people and when you visit a  healthcare provider. People who live with or visit you should also wear a facemask while they are in the same room with you.  Separate yourself from other people in your home As much as possible, you should stay in a different room from other people in your home. Also, you  should use a separate bathroom, if available.  Avoid sharing household items You should not share dishes, drinking glasses, cups, eating utensils, towels, bedding, or other items with other people in your home. After using these items, you should wash them thoroughly with soap and water.  Cover your coughs and sneezes Cover your mouth and nose with a tissue when you cough or sneeze, or you can cough or sneeze into your sleeve. Throw used tissues in a lined trash can, and immediately wash your hands with soap and water for at least 20 seconds or use an alcohol-based hand rub.  Wash your [employer] your hands often and thoroughly with soap and water for at least 20 seconds. You can use an alcohol-based hand sanitizer if soap and water are not available and if your hands are not visibly dirty. Avoid touching your eyes, nose, and mouth with unwashed hands.  Directions for those who live with, or provide care at home for you:  Limit the number of people who have contact with the patient If possible, have only one caregiver for the patient. Other household members should stay in another home or place of residence. If this is not possible, they should stay in another room, or be separated from the patient as much as possible. Use a separate bathroom, if available. Restrict visitors who do not have an essential need to be in the home.  Ensure good ventilation Make sure that shared spaces in the home have good air flow, such as from an air conditioner or an opened window, weather permitting.  Wash your hands often Wash your hands often and thoroughly with soap and water for at least 20 seconds. You can use an alcohol based hand sanitizer if soap and water are not available and if your hands are not visibly dirty. Avoid touching your eyes, nose, and mouth with unwashed hands. Use disposable paper towels to dry your hands. If not available, use dedicated cloth towels and replace them when they  become wet.  Wear a facemask and gloves Wear a disposable facemask at all times in the room and gloves when you touch or have contact with the patient's blood, body fluids, and/or secretions or excretions, such as sweat, saliva, sputum, nasal mucus, vomit, urine, or feces.  Ensure the mask fits over your nose and mouth tightly, and do not touch it during use. Throw out disposable facemasks and gloves after using them. Do not reuse. Wash your hands immediately after removing your facemask and gloves. If your personal clothing becomes contaminated, carefully remove clothing and launder. Wash your hands after handling contaminated clothing. Place all used disposable facemasks, gloves, and other waste in a lined container before disposing them with other household waste. Remove gloves and wash your hands immediately after handling these items.  Do not share dishes, glasses, or other household items with the patient Avoid sharing household items. You should not share dishes, drinking glasses, cups, eating utensils, towels, bedding, or other items with a patient who is confirmed to have, or being evaluated for, COVID-19 infection. After the person uses these items, you should wash them thoroughly with soap and water.  Wash laundry thoroughly Immediately remove and wash  clothes or bedding that have blood, body fluids, and/or secretions or excretions, such as sweat, saliva, sputum, nasal mucus, vomit, urine, or feces, on them. Wear gloves when handling laundry from the patient. Read and follow directions on labels of laundry or clothing items and detergent. In general, wash and dry with the warmest temperatures recommended on the label.  Clean all areas the individual has used often Clean all touchable surfaces, such as counters, tabletops, doorknobs, bathroom fixtures, toilets, phones, keyboards, tablets, and bedside tables, every day. Also, clean any surfaces that may have blood, body fluids, and/or  secretions or excretions on them. Wear gloves when cleaning surfaces the patient has come in contact with. Use a diluted bleach solution (e.g., dilute bleach with 1 part bleach and 10 parts water) or a household disinfectant with a label that says EPA-registered for coronaviruses. To make a bleach solution at home, add 1 tablespoon of bleach to 1 quart (4 cups) of water. For a larger supply, add  cup of bleach to 1 gallon (16 cups) of water. Read labels of cleaning products and follow recommendations provided on product labels. Labels contain instructions for safe and effective use of the cleaning product including precautions you should take when applying the product, such as wearing gloves or eye protection and making sure you have good ventilation during use of the product. Remove gloves and wash hands immediately after cleaning.  Monitor yourself for signs and symptoms of illness Caregivers and household members are considered close contacts, should monitor their health, and will be asked to limit movement outside of the home to the extent possible. Follow the monitoring steps for close contacts listed on the symptom monitoring form.  If you have additional questions, contact your local health department or call the epidemiologist on call at [phone] (available 24/7). This guidance is subject to change. For the most up-to-date guidance from [facility], please refer to their website: [email or web address]   Increase activity slowly   Complete by: As directed    MyChart COVID-19 home monitoring program   Complete by: Jul 21, 2020    Is the patient willing to use the MyChart Mobile App for home monitoring?: Yes     Allergies as of 07/21/2020   No Known Allergies     Medication List    STOP taking these medications   colchicine 0.6 MG tablet   HYDROcodone-acetaminophen 5-325 MG tablet Commonly known as: Norco     TAKE these  medications   acetaminophen 325 MG tablet Commonly known as: TYLENOL Take 2 tablets (650 mg total) by mouth every 4 (four) hours as needed for headache or mild pain.   amLODipine 10 MG tablet Commonly known as: NORVASC Take 1 tablet (10 mg total) by mouth daily.   Apixaban Starter Pack (10mg  and 5mg ) Commonly known as: ELIQUIS STARTER PACK Take as directed on package: start with two-5mg  tablets twice daily for 7 days. On day 8, switch to one-5mg  tablet twice daily.   carvedilol 25 MG tablet Commonly known as: COREG Take 2 tablets (50 mg total) by mouth 2 (two) times daily with a meal. Please schedule annual appt with Dr. for refills. [phone]. 3rd attempt.   CINNAMON PO Take 1 tablet by mouth daily.   dexamethasone 6 MG tablet Commonly known as: Decadron Take 1 tablet (6 mg total) by mouth daily for 8 days. Start taking on: July 22, 2020   enalapril 20 MG tablet Commonly known as: VASOTEC Take 1 tablet (20 mg total) by mouth  2 (two) times daily.   furosemide 40 MG tablet Commonly known as: LASIX Take 1 tablet (40 mg total) by mouth daily.   Garlic Tabs Take 1 tablet by mouth daily.   VITAMIN D-3 PO Take 1 tablet by mouth daily.   zolpidem 10 MG tablet Commonly known as: AMBIEN Take 1 tablet (10 mg total) by mouth at bedtime as needed for sleep.       Follow-up Information    Georgianne Fick, MD. Schedule an appointment as soon as possible for a visit.   Specialty: Internal Medicine Contact information: [address] [place] [address] [phone]        [facility]. Go on 07/22/2020.   Contact information: [address] [place] [address] [phone]             No Known Allergies  Consultations:  None  Procedures/Studies: CT Angio Chest PE W and/or Wo Contrast  Result Date: 07/19/2020 CLINICAL DATA:  Elevated D-dimer, chest pain and shortness of breath,  COVID-19 positive EXAM: CT ANGIOGRAPHY CHEST WITH CONTRAST TECHNIQUE: Multidetector CT imaging of the chest was performed using the standard protocol during bolus administration of intravenous contrast. Multiplanar CT image reconstructions and MIPs were obtained to evaluate the vascular anatomy. CONTRAST:  OMNIPAQUE IOHEXOL 350 MG/ML SOLN COMPARISON:  07/19/2020, 11/27/2019 FINDINGS: Cardiovascular: This is a technically adequate evaluation of the pulmonary vasculature. No filling defects or pulmonary emboli. Left ventricular hypertrophy is noted. There is mild aneurysmal dilatation of the descending thoracic aorta measuring 4.1 cm. No dissection. Mild atherosclerosis of the aortic arch. Mediastinum/Nodes: No enlarged mediastinal, hilar, or axillary lymph nodes. Thyroid gland, trachea, and esophagus demonstrate no significant findings. Lungs/Pleura: There is multifocal bilateral ground-glass airspace disease primarily in a subpleural distribution, consistent with multifocal pneumonia and COVID-19. No effusion or pneumothorax. The central airways are patent. Upper Abdomen: No acute abnormality. Musculoskeletal: No acute or destructive bony lesions. Reconstructed images demonstrate no additional findings. Review of the MIP images confirms the above findings. IMPRESSION: 1. No evidence of pulmonary embolus. 2. Multifocal bilateral ground-glass airspace disease consistent with multifocal pneumonia and COVID-19. 3. Mild aneurysmal dilatation of the descending thoracic aorta measuring up to 4.1 cm. No dissection. Recommend semi-annual imaging followup by CTA or MRA and referral to cardiothoracic surgery if not already obtained. This recommendation follows 2010 ACCF/AHA/AATS/ACR/ASA/SCA/SCAI/SIR/STS/SVM Guidelines for the Diagnosis and Management of Patients With Thoracic Aortic Disease. Circulation. 2010; 121: O130-Q65. Aortic aneurysm NOS (ICD10-I71.9) 4. Aortic Atherosclerosis (ICD10-I70.0). Electronically Signed    By: Sharlet Salina M.D.   On: 07/19/2020 23:29   DG Chest Port 1 View  Result Date: 07/19/2020 CLINICAL DATA:  Fever, cough, congestion, chills EXAM: PORTABLE CHEST 1 VIEW COMPARISON:  12/28/2016 FINDINGS: Single frontal view of the chest demonstrates an enlarged cardiac silhouette. There is diffuse interstitial prominence with patchy bilateral ground-glass airspace disease. No effusion or pneumothorax. IMPRESSION: 1. Interstitial and ground-glass opacities, most consistent with COVID-19 pneumonia. Interstitial edema could give a similar pattern. Electronically Signed   By: Sharlet Salina M.D.   On: 07/19/2020 18:22   VAS [place] LOWER EXTREMITY VENOUS (DVT)  Result Date: 07/20/2020  Lower Venous DVTStudy Indications: COVID-19 infection. Elevated d-dimer.  Comparison Study: No prior study Performing Technologist: Gertie Fey MHA, RDMS, RVT, RDCS  Examination Guidelines: A complete evaluation includes B-mode imaging, spectral Doppler, color Doppler, and power Doppler as needed of all accessible portions of each vessel. Bilateral testing is considered an integral part of a complete  examination. Limited examinations for reoccurring indications may be performed as noted. The reflux portion of the exam is performed with the patient in reverse Trendelenburg.  +---------+---------------+---------+-----------+----------+--------------+ RIGHT    CompressibilityPhasicitySpontaneityPropertiesThrombus Aging +---------+---------------+---------+-----------+----------+--------------+ CFV      Full           Yes      Yes                                 +---------+---------------+---------+-----------+----------+--------------+ SFJ      Full                                                        +---------+---------------+---------+-----------+----------+--------------+ FV Prox  Full                                                         +---------+---------------+---------+-----------+----------+--------------+ FV Mid   Full                                                        +---------+---------------+---------+-----------+----------+--------------+ FV DistalFull                                                        +---------+---------------+---------+-----------+----------+--------------+ PFV      Full                                                        +---------+---------------+---------+-----------+----------+--------------+ POP      Full           Yes      Yes                                 +---------+---------------+---------+-----------+----------+--------------+ PTV      None                    No                   Acute          +---------+---------------+---------+-----------+----------+--------------+ PERO     Full                                                        +---------+---------------+---------+-----------+----------+--------------+   +---------+---------------+---------+-----------+----------+--------------+ LEFT     CompressibilityPhasicitySpontaneityPropertiesThrombus Aging +---------+---------------+---------+-----------+----------+--------------+ CFV      Full  Yes      Yes                                 +---------+---------------+---------+-----------+----------+--------------+ SFJ      Full                                                        +---------+---------------+---------+-----------+----------+--------------+ FV Prox  Full                                                        +---------+---------------+---------+-----------+----------+--------------+ FV Mid   Full                                                        +---------+---------------+---------+-----------+----------+--------------+ FV DistalFull                                                         +---------+---------------+---------+-----------+----------+--------------+ PFV      Full                                                        +---------+---------------+---------+-----------+----------+--------------+ POP      Full           Yes      Yes                                 +---------+---------------+---------+-----------+----------+--------------+ PTV      Full                                                        +---------+---------------+---------+-----------+----------+--------------+ PERO     Full                                                        +---------+---------------+---------+-----------+----------+--------------+     Summary: RIGHT: - Findings consistent with acute deep vein thrombosis involving a single right posterior tibial vein. - No cystic structure found in the popliteal fossa.  LEFT: - There is no evidence of deep vein thrombosis in the lower extremity.  - No cystic structure found in the popliteal fossa.  *See table(s) above for measurements and observations. Electronically signed by Waverly Ferrari MD on 07/20/2020 at  3:40:03 PM.    Final       Subjective: Feels well, eager to discharge. Walked in the room at length with RN and PT with very limited dyspnea, overall significantly improved, and no hypoxemia. No leg swelling or pain, chest pain.  Discharge Exam: Vitals:   07/21/20 1000 07/21/20 1152  BP:    Pulse: 82 80  Resp: (!) 28 20  Temp:    SpO2: 95% 96%   General: Pt is alert, awake, not in acute distress Cardiovascular: RRR, S1/S2 +, no rubs, no gallops Respiratory: CTA bilaterally, no wheezing, no rhonchi Abdominal: Soft, NT, ND, bowel sounds + Extremities: No edema, no cyanosis  Labs: BNP (last 3 results) Recent Labs    07/19/20 2040  BNP 137.4*   Basic Metabolic Panel: Recent Labs  Lab 07/19/20 2040 07/20/20 1151 07/21/20 0152  NA 138 139 134*  K 3.7 4.2 4.2  CL 97* 102 102  CO2 26 24 21*   GLUCOSE 117* 117* 149*  BUN 19 18 20   CREATININE 1.74* 1.00 0.96  CALCIUM 8.9 9.0 8.8*  MG  --  1.8 2.1  PHOS  --  3.5 3.6   Liver Function Tests: Recent Labs  Lab 07/20/20 1151 07/21/20 0152  AST 48* 40  ALT 44 44  ALKPHOS 37* 38  BILITOT 0.8 0.4  PROT 6.7 6.9  ALBUMIN 2.9* 2.9*   No results for input(s): LIPASE, AMYLASE in the last 168 hours. No results for input(s): AMMONIA in the last 168 hours. CBC: Recent Labs  Lab 07/19/20 2040 07/20/20 1151 07/21/20 0152  WBC 7.4 5.9 7.4  NEUTROABS  --  4.5 6.3  HGB 12.9* 12.3* 11.9*  HCT 41.8 39.9 38.9*  MCV 69.9* 69.8* 72.0*  PLT 225 PLATELET CLUMPS NOTED ON SMEAR, UNABLE TO ESTIMATE 242   Cardiac Enzymes: No results for input(s): CKTOTAL, CKMB, CKMBINDEX, TROPONINI in the last 168 hours. BNP: Invalid input(s): POCBNP CBG: No results for input(s): GLUCAP in the last 168 hours. D-Dimer Recent Labs    07/19/20 2040 07/21/20 0152  DDIMER 10.09* 10.76*   Hgb A1c No results for input(s): HGBA1C in the last 72 hours. Lipid Profile No results for input(s): CHOL, HDL, LDLCALC, TRIG, CHOLHDL, LDLDIRECT in the last 72 hours. Thyroid function studies No results for input(s): TSH, T4TOTAL, T3FREE, THYROIDAB in the last 72 hours.  Invalid input(s): FREET3 Anemia work up Recent Labs    07/20/20 1151 07/21/20 0152  FERRITIN 207 228   Urinalysis    Component Value Date/Time   COLORURINE YELLOW 07/26/2009 2221   APPEARANCEUR CLEAR 07/26/2009 2221   LABSPEC 1.025 07/26/2009 2221   PHURINE 5.5 07/26/2009 2221   GLUCOSEU NEGATIVE 07/26/2009 2221   HGBUR SMALL (A) 07/26/2009 2221   BILIRUBINUR NEGATIVE 07/26/2009 2221   KETONESUR NEGATIVE 07/26/2009 2221   PROTEINUR >300 (A) 07/26/2009 2221   UROBILINOGEN 0.2 07/26/2009 2221   NITRITE NEGATIVE 07/26/2009 2221   LEUKOCYTESUR NEGATIVE 07/26/2009 2221    Microbiology Recent Results (from the past 240 hour(s))  Resp Panel by RT PCR (RSV, Flu A&B, Covid) -  Nasopharyngeal Swab     Status: Abnormal   Collection Time: 07/19/20  4:48 PM   Specimen: Nasopharyngeal Swab  Result Value Ref Range Status   SARS Coronavirus 2 by RT PCR POSITIVE (A) NEGATIVE Final    Comment: RESULT CALLED TO, READ BACK BY AND VERIFIED WITH: Kathryne Eriksson RN [phone] PHILLIPS C (NOTE) SARS-CoV-2 target nucleic acids are DETECTED.  SARS-CoV-2 RNA is  generally detectable in upper respiratory specimens  during the acute phase of infection. Positive results are indicative of the presence of the identified virus, but do not rule out bacterial infection or co-infection with other pathogens not detected by the test. Clinical correlation with patient history and other diagnostic information is necessary to determine patient infection status. The expected result is Negative.  Fact Sheet for Patients:  https://www.moore.com/  Fact Sheet for Healthcare Providers: https://www.young.biz/  This test is not yet approved or cleared by the [place] FDA and  has been authorized for detection and/or diagnosis of SARS-CoV-2 by FDA under an Emergency Use Authorization (EUA).  This EUA will remain in effect (meaning this test c an be used) for the duration of  the COVID-19 declaration under Section 564(b)(1) of the Act, 21 U.S.C. section 360bbb-3(b)(1), unless the authorization is terminated or revoked sooner.      Influenza A by PCR NEGATIVE NEGATIVE Final   Influenza B by PCR NEGATIVE NEGATIVE Final    Comment: (NOTE) The Xpert Xpress SARS-CoV-2/FLU/RSV assay is intended as an aid in  the diagnosis of influenza from Nasopharyngeal swab specimens and  should not be used as a sole basis for treatment. Nasal washings and  aspirates are unacceptable for Xpert Xpress SARS-CoV-2/FLU/RSV  testing.  Fact Sheet for Patients: https://www.moore.com/  Fact Sheet for Healthcare  Providers: https://www.young.biz/  This test is not yet approved or cleared by the [place] FDA and  has been authorized for detection and/or diagnosis of SARS-CoV-2 by  FDA under an Emergency Use Authorization (EUA). This EUA will remain  in effect (meaning this test can be used) for the duration of the  Covid-19 declaration under Section 564(b)(1) of the Act, 21  U.S.C. section 360bbb-3(b)(1), unless the authorization is  terminated or revoked.    Respiratory Syncytial Virus by PCR NEGATIVE NEGATIVE Final    Comment: (NOTE) Fact Sheet for Patients: https://www.moore.com/  Fact Sheet for Healthcare Providers: https://www.young.biz/  This test is not yet approved or cleared by the [place] FDA and  has been authorized for detection and/or diagnosis of SARS-CoV-2 by  FDA under an Emergency Use Authorization (EUA). This EUA will remain  in effect (meaning this test can be used) for the duration of the  COVID-19 declaration under Section 564(b)(1) of the Act, 21 U.S.C.  section 360bbb-3(b)(1), unless the authorization is terminated or  revoked. Performed at [facility], [address] [place], [place] [address]   MRSA PCR Screening     Status: None   Collection Time: 07/20/20 10:27 AM   Specimen: Nasal Mucosa; Nasopharyngeal  Result Value Ref Range Status   MRSA by PCR NEGATIVE NEGATIVE Final    Comment:        The GeneXpert MRSA Assay (FDA approved for NASAL specimens only), is one component of a comprehensive MRSA colonization surveillance program. It is not intended to diagnose MRSA infection nor to guide or monitor treatment for MRSA infections. Performed at [facility], [address], [place], [place] [address]     Time coordinating discharge: Approximately 40 minutes  Tyrone Nine, MD  [facility] 07/21/2020, 12:12 PM

## 2020-07-21 NOTE — Discharge Instructions (Signed)
Patient scheduled for outpatient Remdesivir infusions at 9am on Tuesday 8/24, Wednesday 8/25, and Thursday 8/26 at [facility]. Please inform the patient to park at [address] [place], [place], as staff will be escorting the patient through the east entrance of the hospital.    There is a wave flag banner located near the entrance on [address]. Turn into this entrance and immediately turn left and park in 1 of the 5 designated Covid Infusion Parking spots. There is a phone number on the sign, please call and let the staff know what spot you are in and we will come out and get you. For questions call [phone].  Thanks.

## 2020-07-21 NOTE — Evaluation (Signed)
Physical Therapy Evaluation Patient Details Name: Craig Prince MRN: [id number] DOB: [birth year] Today's Date: 07/21/2020   History of Present Illness  Craig Prince is a 64 y.o. male with history of 64 year old male with history of combined CHF/NIDDM, aortic root enlargement, nonobstructive CAD, HTN and gout presenting to [facility] ED with acute shortness of breath on 07/19/20.Patient has had Covid symptoms including loss of taste and smell, myalgia, poor appetite, productive cough , fatigue, chills and intermittent loose stool for about 3 weeks.In ED, COVID-19 PCR positive  Clinical Impression  The  Patient is mobilizing well, slight balance loss initially standing up. Patient's SPO2 on 2 L 96 then on RA after ambulating x 20' x 2 @ 97%. RN aware on RA. Patient should progress to Dc home. Pt admitted with above diagnosis.   Pt currently with functional limitations due to the deficits listed below (see PT Problem List). Pt will benefit from skilled PT to increase their independence and safety with mobility to allow discharge to the venue listed below.       Follow Up Recommendations No PT follow up    Equipment Recommendations  None recommended by PT    Recommendations for Other Services       Precautions / Restrictions Precautions Precaution Comments: monitor sats      Mobility  Bed Mobility Overal bed mobility: Independent                Transfers Overall transfer level: Needs assistance Equipment used: None Transfers: Sit to/from Stand Sit to Stand: Supervision         General transfer comment: no assistance  Ambulation/Gait Ambulation/Gait assistance: Supervision   Assistive device: None Gait Pattern/deviations: Drifts right/left Gait velocity: decr   General Gait Details: upon standing and starting to ambulate, noted to sway, required steady assistance, patient did steady self  Stairs            Wheelchair Mobility    Modified Rankin (Stroke Patients  Only)       Balance Overall balance assessment: Mild deficits observed, not formally tested                                           Pertinent Vitals/Pain Pain Assessment: No/denies pain    Home Living Family/patient expects to be discharged to:: Private residence Living Arrangements: Children Available Help at Discharge: Family Type of Home: House Home Access: Stairs to enter   Secretary/administrator of Steps: 1 Home Layout: One level Home Equipment: Cane - single point      Prior Function Level of Independence: Independent         Comments: still works some, retired.     Hand Dominance   Dominant Hand: Right    Extremity/Trunk Assessment   Upper Extremity Assessment Upper Extremity Assessment: Overall WFL for tasks assessed    Lower Extremity Assessment Lower Extremity Assessment: Overall WFL for tasks assessed    Cervical / Trunk Assessment Cervical / Trunk Assessment: Normal  Communication   Communication: No difficulties  Cognition Arousal/Alertness: Awake/alert Behavior During Therapy: WFL for tasks assessed/performed Overall Cognitive Status: Within Functional Limits for tasks assessed                                        General Comments  Exercises Other Exercises Other Exercises: encouraged IS and FV   Assessment/Plan    PT Assessment Patient needs continued PT services  PT Problem List Decreased strength;Decreased activity tolerance;Decreased balance;Decreased mobility       PT Treatment Interventions Gait training;DME instruction;Balance training;Functional mobility training;Patient/family education;Therapeutic activities    PT Goals (Current goals can be found in the Care Plan section)  Acute Rehab PT Goals Patient Stated Goal: go home PT Goal Formulation: With patient Time For Goal Achievement: 08/04/20 Potential to Achieve Goals: Good    Frequency Min 3X/week   Barriers to  discharge        Co-evaluation               AM-PAC PT "6 Clicks" Mobility  Outcome Measure Help needed turning from your back to your side while in a flat bed without using bedrails?: None Help needed moving from lying on your back to sitting on the side of a flat bed without using bedrails?: None Help needed moving to and from a bed to a chair (including a wheelchair)?: None Help needed standing up from a chair using your arms (e.g., wheelchair or bedside chair)?: None Help needed to walk in hospital room?: A Little Help needed climbing 3-5 steps with a railing? : A Little 6 Click Score: 22    End of Session   Activity Tolerance: Patient tolerated treatment well Patient left: in chair;with bed alarm set Nurse Communication: Mobility status PT Visit Diagnosis: Difficulty in walking, not elsewhere classified (R26.2)    Time: 1517-6160 PT Time Calculation (min) (ACUTE ONLY): 23 min   Charges:   PT Evaluation $PT Eval Low Complexity: 1 Low PT Treatments $Gait Training: 8-22 mins        Blanchard Kelch PT Acute Rehabilitation Services Pager [phone] Office [phone]   Rada Hay 07/21/2020, 9:25 AM

## 2020-07-22 ENCOUNTER — Ambulatory Visit (HOSPITAL_COMMUNITY)
Admit: 2020-07-22 | Discharge: 2020-07-22 | Disposition: A | Discharge status: Home / Self Care | Payer: BLUE CROSS/BLUE SHIELD | Attending: Pulmonary Disease | Admitting: Pulmonary Disease

## 2020-07-22 DIAGNOSIS — U071 COVID-19: Secondary | ICD-10-CM | POA: Diagnosis present

## 2020-07-22 MED ORDER — EPINEPHRINE 0.3 MG/0.3ML IJ SOAJ: 0.3000 mg | Freq: Once | INTRAMUSCULAR | Status: DC | PRN

## 2020-07-22 MED ORDER — ALBUTEROL SULFATE HFA 108 (90 BASE) MCG/ACT IN AERS: 2.0000 | INHALATION_SPRAY | Freq: Once | RESPIRATORY_TRACT | Status: DC | PRN

## 2020-07-22 MED ORDER — SODIUM CHLORIDE 0.9 % IV SOLN
100.0000 mg | Freq: Once | INTRAVENOUS | Status: AC
  Administered 2020-07-22: 100 mg via INTRAVENOUS
  Filled 2020-07-22: qty 20

## 2020-07-22 MED ORDER — SODIUM CHLORIDE 0.9 % IV SOLN: INTRAVENOUS | Status: DC | PRN

## 2020-07-22 MED ORDER — DIPHENHYDRAMINE HCL 50 MG/ML IJ SOLN: 50.0000 mg | Freq: Once | INTRAMUSCULAR | Status: DC | PRN

## 2020-07-22 MED ORDER — METHYLPREDNISOLONE SODIUM SUCC 125 MG IJ SOLR: 125.0000 mg | Freq: Once | INTRAMUSCULAR | Status: DC | PRN

## 2020-07-22 MED ORDER — FAMOTIDINE IN NACL 20-0.9 MG/50ML-% IV SOLN: 20.0000 mg | Freq: Once | INTRAVENOUS | Status: DC | PRN

## 2020-07-22 NOTE — Progress Notes (Signed)
  Diagnosis: COVID-19  Physician: Dr Shan Levans  Procedure: Covid Infusion Clinic Med: remdesivir infusion - Provided patient with remdesivir fact sheet for patients, parents and caregivers prior to infusion.  Complications: No immediate complications noted.  Discharge: Discharged home   Craig Prince 07/22/2020

## 2020-07-22 NOTE — Discharge Instructions (Signed)

## 2020-07-23 ENCOUNTER — Ambulatory Visit (HOSPITAL_COMMUNITY)
Admit: 2020-07-23 | Discharge: 2020-07-23 | Disposition: A | Discharge status: Home / Self Care | Payer: BLUE CROSS/BLUE SHIELD | Attending: Pulmonary Disease | Admitting: Pulmonary Disease

## 2020-07-23 DIAGNOSIS — U071 COVID-19: Secondary | ICD-10-CM | POA: Insufficient documentation

## 2020-07-23 MED ORDER — EPINEPHRINE 0.3 MG/0.3ML IJ SOAJ: 0.3000 mg | Freq: Once | INTRAMUSCULAR | Status: DC | PRN

## 2020-07-23 MED ORDER — METHYLPREDNISOLONE SODIUM SUCC 125 MG IJ SOLR: 125.0000 mg | Freq: Once | INTRAMUSCULAR | Status: DC | PRN

## 2020-07-23 MED ORDER — FAMOTIDINE IN NACL 20-0.9 MG/50ML-% IV SOLN: 20.0000 mg | Freq: Once | INTRAVENOUS | Status: DC | PRN

## 2020-07-23 MED ORDER — DIPHENHYDRAMINE HCL 50 MG/ML IJ SOLN: 50.0000 mg | Freq: Once | INTRAMUSCULAR | Status: DC | PRN

## 2020-07-23 MED ORDER — ALBUTEROL SULFATE HFA 108 (90 BASE) MCG/ACT IN AERS: 2.0000 | INHALATION_SPRAY | Freq: Once | RESPIRATORY_TRACT | Status: DC | PRN

## 2020-07-23 MED ORDER — SODIUM CHLORIDE 0.9 % IV SOLN
100.0000 mg | Freq: Once | INTRAVENOUS | Status: AC
  Administered 2020-07-23: 100 mg via INTRAVENOUS
  Filled 2020-07-23: qty 20

## 2020-07-23 MED ORDER — SODIUM CHLORIDE 0.9 % IV SOLN: INTRAVENOUS | Status: DC | PRN

## 2020-07-23 NOTE — Discharge Instructions (Signed)
10 Things You Can Do to Manage Your COVID-19 Symptoms at Home If you have possible or confirmed COVID-19: 1. Stay home from work and school. And stay away from other public places. If you must go out, avoid using any kind of public transportation, ridesharing, or taxis. 2. Monitor your symptoms carefully. If your symptoms get worse, call your healthcare provider immediately. 3. Get rest and stay hydrated. 4. If you have a medical appointment, call the healthcare provider ahead of time and tell them that you have or may have COVID-19. 5. For medical emergencies, call 911 and notify the dispatch personnel that you have or may have COVID-19. 6. Cover your cough and sneezes with a tissue or use the inside of your elbow. 7. Wash your hands often with soap and water for at least 20 seconds or clean your hands with an alcohol-based hand sanitizer that contains at least 60% alcohol. 8. As much as possible, stay in a specific room and away from other people in your home. Also, you should use a separate bathroom, if available. If you need to be around other people in or outside of the home, wear a mask. 9. Avoid sharing personal items with other people in your household, like dishes, towels, and bedding. 10. Clean all surfaces that are touched often, like counters, tabletops, and doorknobs. Use household cleaning sprays or wipes according to the label instructions. cdc.gov/coronavirus 05/30/2019 This information is not intended to replace advice given to you by your health care provider. Make sure you discuss any questions you have with your health care provider. Document Revised: 11/01/2019 Document Reviewed: 11/01/2019 Elsevier Patient Education  2020 Elsevier Inc.  

## 2020-07-23 NOTE — Progress Notes (Addendum)
  Diagnosis: COVID-19  Physician:Dr Patrick Wright    Procedure: Covid Infusion Clinic Med: remdesivir infusion - Provided patient with remdesivir fact sheet for patients, parents and caregivers prior to infusion.  Complications: No immediate complications noted.  Discharge: Discharged home   Craig Prince 07/23/2020   

## 2020-07-24 ENCOUNTER — Ambulatory Visit (HOSPITAL_COMMUNITY)
Admit: 2020-07-24 | Discharge: 2020-07-24 | Disposition: A | Discharge status: Home / Self Care | Payer: BLUE CROSS/BLUE SHIELD | Attending: Pulmonary Disease | Admitting: Pulmonary Disease

## 2020-07-24 DIAGNOSIS — J1282 Pneumonia due to coronavirus disease 2019: Secondary | ICD-10-CM | POA: Insufficient documentation

## 2020-07-24 DIAGNOSIS — U071 COVID-19: Secondary | ICD-10-CM | POA: Diagnosis present

## 2020-07-24 MED ORDER — SODIUM CHLORIDE 0.9 % IV SOLN: INTRAVENOUS | Status: DC | PRN

## 2020-07-24 MED ORDER — DIPHENHYDRAMINE HCL 50 MG/ML IJ SOLN: 50.0000 mg | Freq: Once | INTRAMUSCULAR | Status: DC | PRN

## 2020-07-24 MED ORDER — SODIUM CHLORIDE 0.9 % IV SOLN
100.0000 mg | Freq: Once | INTRAVENOUS | Status: AC
  Administered 2020-07-24: 100 mg via INTRAVENOUS
  Filled 2020-07-24: qty 20

## 2020-07-24 MED ORDER — METHYLPREDNISOLONE SODIUM SUCC 125 MG IJ SOLR: 125.0000 mg | Freq: Once | INTRAMUSCULAR | Status: DC | PRN

## 2020-07-24 MED ORDER — FAMOTIDINE IN NACL 20-0.9 MG/50ML-% IV SOLN: 20.0000 mg | Freq: Once | INTRAVENOUS | Status: DC | PRN

## 2020-07-24 MED ORDER — EPINEPHRINE 0.3 MG/0.3ML IJ SOAJ: 0.3000 mg | Freq: Once | INTRAMUSCULAR | Status: DC | PRN

## 2020-07-24 MED ORDER — ALBUTEROL SULFATE HFA 108 (90 BASE) MCG/ACT IN AERS: 2.0000 | INHALATION_SPRAY | Freq: Once | RESPIRATORY_TRACT | Status: DC | PRN

## 2020-07-24 NOTE — Discharge Instructions (Signed)
10 Things You Can Do to Manage Your COVID-19 Symptoms at Home If you have possible or confirmed COVID-19: 1. Stay home from work and school. And stay away from other public places. If you must go out, avoid using any kind of public transportation, ridesharing, or taxis. 2. Monitor your symptoms carefully. If your symptoms get worse, call your healthcare provider immediately. 3. Get rest and stay hydrated. 4. If you have a medical appointment, call the healthcare provider ahead of time and tell them that you have or may have COVID-19. 5. For medical emergencies, call 911 and notify the dispatch personnel that you have or may have COVID-19. 6. Cover your cough and sneezes with a tissue or use the inside of your elbow. 7. Wash your hands often with soap and water for at least 20 seconds or clean your hands with an alcohol-based hand sanitizer that contains at least 60% alcohol. 8. As much as possible, stay in a specific room and away from other people in your home. Also, you should use a separate bathroom, if available. If you need to be around other people in or outside of the home, wear a mask. 9. Avoid sharing personal items with other people in your household, like dishes, towels, and bedding. 10. Clean all surfaces that are touched often, like counters, tabletops, and doorknobs. Use household cleaning sprays or wipes according to the label instructions. cdc.gov/coronavirus 05/30/2019 This information is not intended to replace advice given to you by your health care provider. Make sure you discuss any questions you have with your health care provider. Document Revised: 11/01/2019 Document Reviewed: 11/01/2019 Elsevier Patient Education  2020 Elsevier Inc.  

## 2020-09-04 ENCOUNTER — Encounter (HOSPITAL_BASED_OUTPATIENT_CLINIC_OR_DEPARTMENT_OTHER): Payer: Self-pay | Admitting: Emergency Medicine

## 2020-09-04 ENCOUNTER — Emergency Department (HOSPITAL_BASED_OUTPATIENT_CLINIC_OR_DEPARTMENT_OTHER)
Admission: EM | Admit: 2020-09-04 | Discharge: 2020-09-04 | Disposition: A | Discharge status: Home / Self Care | Payer: BLUE CROSS/BLUE SHIELD | Attending: Emergency Medicine | Admitting: Emergency Medicine

## 2020-09-04 ENCOUNTER — Other Ambulatory Visit: Payer: Self-pay

## 2020-09-04 DIAGNOSIS — Z79899 Other long term (current) drug therapy: Secondary | ICD-10-CM | POA: Insufficient documentation

## 2020-09-04 DIAGNOSIS — Z7901 Long term (current) use of anticoagulants: Secondary | ICD-10-CM | POA: Insufficient documentation

## 2020-09-04 DIAGNOSIS — I10 Essential (primary) hypertension: Secondary | ICD-10-CM

## 2020-09-04 DIAGNOSIS — I2510 Atherosclerotic heart disease of native coronary artery without angina pectoris: Secondary | ICD-10-CM | POA: Diagnosis not present

## 2020-09-04 DIAGNOSIS — M25521 Pain in right elbow: Secondary | ICD-10-CM | POA: Diagnosis not present

## 2020-09-04 DIAGNOSIS — M10021 Idiopathic gout, right elbow: Secondary | ICD-10-CM

## 2020-09-04 DIAGNOSIS — I110 Hypertensive heart disease with heart failure: Secondary | ICD-10-CM | POA: Diagnosis not present

## 2020-09-04 DIAGNOSIS — I5022 Chronic systolic (congestive) heart failure: Secondary | ICD-10-CM | POA: Diagnosis not present

## 2020-09-04 MED ORDER — COLCHICINE 0.6 MG PO TABS: 0.6000 mg | ORAL_TABLET | Freq: Every day | ORAL | 0 refills | Status: DC

## 2020-09-04 MED ORDER — PREDNISONE 20 MG PO TABS: ORAL_TABLET | ORAL | 0 refills | Status: DC

## 2020-09-04 MED ORDER — PREDNISONE 50 MG PO TABS
60.0000 mg | ORAL_TABLET | Freq: Once | ORAL | Status: AC
  Administered 2020-09-04: 60 mg via ORAL
  Filled 2020-09-04: qty 1

## 2020-09-04 MED ORDER — ACETAMINOPHEN 500 MG PO TABS
1000.0000 mg | ORAL_TABLET | Freq: Once | ORAL | Status: AC
  Administered 2020-09-04: 1000 mg via ORAL
  Filled 2020-09-04: qty 2

## 2020-09-04 NOTE — ED Notes (Signed)
Reviewed D/C with pt, reviewed Rx with pt, pt denies questions at this time. 

## 2020-09-04 NOTE — Discharge Instructions (Signed)
It was our pleasure to provide your ER care today - we hope that you feel better.  Take prednisone as prescribed.   Take acetaminophen as need for pain.    Follow up with primary care doctor in 1 week. Also have blood pressure rechecked then, as it is high today.  Return to ER if worse, new symptoms, fevers, increased swelling, spreading redness, intractable pain, or other concern.

## 2020-09-04 NOTE — ED Triage Notes (Signed)
Rt elbow pain x 4 days hx of gout

## 2020-09-04 NOTE — ED Provider Notes (Signed)
[facility] EMERGENCY DEPARTMENT Provider Note   CSN: [id number] Arrival date & time: 09/04/20  1044     History Chief Complaint  Patient presents with  . Arm Pain    Craig Prince is a 64 y.o. male.  Patient with hx gout, c/o right elbow pain for the past 4 days. Symptoms acute onset, moderate, constant, persistent, dull, non radiating. States same symptoms in elbow in past due to gout. Denies trauma to elbow. No repetitive use injury. No shoulder or wrist pain. No arm swelling. States other than localized elbow pain, does not feel sick or ill. No nausea or vomiting. No fever or chills. No recent change in meds or new meds. States prednisone has helped similar symptoms in past.   The history is provided by the patient.  Arm Pain Pertinent negatives include no chest pain, no headaches and no shortness of breath.       Past Medical History:  Diagnosis Date  . Arthritis   . CAD (coronary artery disease)    NONOBSTRUCTIVE  a. cath 8/10: prox to mid LAD 20%  . Chronic systolic heart failure ([place])   . Essential hypertension, benign   . Family history of breast cancer   . Family history of prostate cancer   . Gout   . Gout   . Hypertrophic nonobstructive cardiomyopathy ([place])    septal hypertrophy without LVOT  . NICM (nonischemic cardiomyopathy) ([place])    a. echo 8/10: EF20-25%, mod LAE, severe asymmetric septal hypertrophy (? nonobs. HCM); grade 1 diast dyfxn.  EF 55% echo June 6    Patient Active Problem List   Diagnosis Date Noted  . Pneumonia due to COVID-19 virus 07/19/2020  . Aortic root enlargement ([place]) 10/05/2018  . Onychomycosis 09/20/2018  . Genetic testing 07/27/2018  . Family history of prostate cancer   . Family history of breast cancer   . Troponin level elevated 12/29/2016  . Chest pain with high risk for cardiac etiology 12/28/2016  . Chest pain 12/28/2016  . Left inguinal hernia 01/08/2014  . Insomnia 05/19/2012  . Hypertrophic  nonobstructive cardiomyopathy ([place]) 05/04/2011  . Chronic systolic heart failure ([place])   . Non Obstructive CAD by cath 06/2009   . OBESITY, UNSPECIFIED 10/14/2010  . History of NICM- resolved at last echo 08/20/2009  . Essential hypertension 01/26/2007  . GASTROINTESTINAL HEMORRHAGE 01/26/2007    Past Surgical History:  Procedure Laterality Date  . KNEE ARTHROSCOPY     right       Family History  Problem Relation Age of Onset  . Kidney disease Brother   . Cancer Brother        prostate  . Kidney disease Father   . Diabetes Mother   . Heart attack Mother 32  . Heart disease Mother   . Cancer Sister        breast  . CAD Brother 23       60% blockage  . Breast cancer Sister 34  . Breast cancer Sister 28       PALB2 pos  . Healthy Son   . Healthy Daughter     Social History   Tobacco Use  . Smoking status: Never Smoker  . Smokeless tobacco: Never Used  Substance Use Topics  . Alcohol use: No    Alcohol/week: 0.0 standard drinks  . Drug use: No    Home Medications Prior to Admission medications   Medication Sig Start Date End Date Taking? Authorizing Provider  acetaminophen (TYLENOL)  325 MG tablet Take 2 tablets (650 mg total) by mouth every 4 (four) hours as needed for headache or mild pain. 12/29/16   Erlene Quan, PA-C  amLODipine (NORVASC) 10 MG tablet Take 1 tablet (10 mg total) by mouth daily. 08/07/18   Minus Breeding, MD  APIXABAN Arne [place]) VTE STARTER PACK (10MG AND 5MG) Take as directed on package: start with two-21m tablets twice daily for 7 days. On day 8, switch to one-533mtablet twice daily. 07/21/20   GrPatrecia PourMD  carvedilol (COREG) 25 MG tablet Take 2 tablets (50 mg total) by mouth 2 (two) times daily with a meal. Please schedule annual appt with Dr. HoPercival Spanishor refills. [id number] attempt. 04/08/20   HoMinus BreedingMD  Cholecalciferol (VITAMIN D-3 PO) Take 1 tablet by mouth daily.    [provider]  CINNAMON PO Take 1 tablet  by mouth daily.      [provider]  enalapril (VASOTEC) 20 MG tablet Take 1 tablet (20 mg total) by mouth 2 (two) times daily. 08/29/19   HoMinus BreedingMD  furosemide (LASIX) 40 MG tablet Take 1 tablet (40 mg total) by mouth daily. 08/07/18   HoMinus BreedingMD  Garlic TABS Take 1 tablet by mouth daily.      [provider]  zolpidem (AMBIEN) 10 MG tablet Take 1 tablet (10 mg total) by mouth at bedtime as needed for sleep. 08/07/18 06/12/22  HoMinus BreedingMD    Allergies    Patient has no known allergies.  Review of Systems   Review of Systems  Constitutional: Negative for chills and fever.  Respiratory: Negative for shortness of breath.   Cardiovascular: Negative for chest pain.  Genitourinary: Negative for dysuria.  Musculoskeletal:       Right elbow pain. No other joint or muscle pain.   Skin: Negative for rash and wound.  Neurological: Negative for weakness, numbness and headaches.    Physical Exam Updated Vital Signs BP (!) 148/111   Pulse 85   Temp 98.2 F (36.8 C) (Oral)   Resp 16   Ht 1.829 m (6')   Wt 99.8 kg   SpO2 98%   BMI 29.84 kg/m   Physical Exam Vitals and nursing note reviewed.  Constitutional:      Appearance: Normal appearance. He is well-developed.  HENT:     Head: Atraumatic.     Nose: Nose normal.     Mouth/Throat:     Mouth: Mucous membranes are moist.  Eyes:     General: No scleral icterus.    Conjunctiva/sclera: Conjunctivae normal.  Neck:     Trachea: No tracheal deviation.  Cardiovascular:     Rate and Rhythm: Normal rate.     Pulses: Normal pulses.  Pulmonary:     Effort: Pulmonary effort is normal. No accessory muscle usage or respiratory distress.  Genitourinary:    Comments: No cva tenderness. Musculoskeletal:     Cervical back: Neck supple.     Comments: Mild-mod swelling to right elbow posteriorly, with tenderness diffusely. w slow/passive rom right elbow, mild discomfort. Radial pulse 2+. No arm swelling.  No cellulitis. Skin intact.   Skin:    General: Skin is warm and dry.     Findings: No rash.  Neurological:     Mental Status: He is alert.     Comments: Alert, speech clear. RUE nvi, with intact motor/sesnory function.   Psychiatric:        Mood and Affect: Mood normal.  ED Results / Procedures / Treatments   Labs (all labs ordered are listed, but only abnormal results are displayed) Labs Reviewed - No data to display  EKG None  Radiology No results found.  Procedures Procedures (including critical care time)  Medications Ordered in ED Medications  predniSONE (DELTASONE) tablet 60 mg (60 mg Oral Given 09/04/20 1143)  acetaminophen (TYLENOL) tablet 1,000 mg (1,000 mg Oral Given 09/04/20 1143)    ED Course  I have reviewed the triage vital signs and the nursing notes.  Pertinent labs & imaging results that were available during my care of the patient were reviewed by me and considered in my medical decision making (see chart for details).    MDM Rules/Calculators/A&P                          Pt indicates current symptoms feel same as prior gout, which he has had in elbow in past, and reports good relief w prednisone.   Reviewed nursing notes and prior charts for additional history.   Prednisone po. Acetaminophen po (no meds at home or pta since 6 AM).  Patient currently appear stable for d/c.  Also rec pcp f/u htn.   Return precautions provided.    Final Clinical Impression(s) / ED Diagnoses Final diagnoses:  None    Rx / DC Orders ED Discharge Orders    None       Lajean Saver, MD 09/04/20 1211

## 2021-01-17 ENCOUNTER — Encounter (HOSPITAL_BASED_OUTPATIENT_CLINIC_OR_DEPARTMENT_OTHER): Payer: Self-pay | Admitting: Emergency Medicine

## 2021-01-17 ENCOUNTER — Other Ambulatory Visit: Payer: Self-pay

## 2021-01-17 ENCOUNTER — Emergency Department (HOSPITAL_BASED_OUTPATIENT_CLINIC_OR_DEPARTMENT_OTHER): Payer: 59

## 2021-01-17 ENCOUNTER — Emergency Department (HOSPITAL_BASED_OUTPATIENT_CLINIC_OR_DEPARTMENT_OTHER)
Admission: EM | Admit: 2021-01-17 | Discharge: 2021-01-17 | Disposition: A | Discharge status: Home / Self Care | Payer: 59 | Attending: Emergency Medicine | Admitting: Emergency Medicine

## 2021-01-17 DIAGNOSIS — I2510 Atherosclerotic heart disease of native coronary artery without angina pectoris: Secondary | ICD-10-CM | POA: Insufficient documentation

## 2021-01-17 DIAGNOSIS — I110 Hypertensive heart disease with heart failure: Secondary | ICD-10-CM | POA: Insufficient documentation

## 2021-01-17 DIAGNOSIS — M10072 Idiopathic gout, left ankle and foot: Secondary | ICD-10-CM | POA: Insufficient documentation

## 2021-01-17 DIAGNOSIS — M79672 Pain in left foot: Secondary | ICD-10-CM | POA: Diagnosis present

## 2021-01-17 DIAGNOSIS — I5022 Chronic systolic (congestive) heart failure: Secondary | ICD-10-CM | POA: Insufficient documentation

## 2021-01-17 DIAGNOSIS — Z8616 Personal history of COVID-19: Secondary | ICD-10-CM | POA: Insufficient documentation

## 2021-01-17 MED ORDER — PREDNISONE 20 MG PO TABS: ORAL_TABLET | ORAL | 0 refills | Status: DC

## 2021-01-17 NOTE — ED Provider Notes (Signed)
Emergency Department Provider Note   I have reviewed the triage vital signs and the nursing notes.   HISTORY  Chief Complaint Foot Pain   HPI Craig Prince is a 65 y.o. male with past medical history of gout, CAD, nonischemic cardiomyopathy, hypertension presents to the emergency department for evaluation of left foot pain for the past 6 days.  He states the pain, distribution, locations are similar to his prior flares of gout.  He has taken prednisone in the past and symptoms are improved.  No fevers or chills.  Denies any knee or hip pain.  No rash.  He has been on Eliquis in the past but is no longer taking this for DVT after its resolution. No CO or SOB. No injury.   Past Medical History:  Diagnosis Date  . Arthritis   . CAD (coronary artery disease)    NONOBSTRUCTIVE  a. cath 8/10: prox to mid LAD 20%  . Chronic systolic heart failure ([place])   . Essential hypertension, benign   . Family history of breast cancer   . Family history of prostate cancer   . Gout   . Gout   . Hypertrophic nonobstructive cardiomyopathy ([place])    septal hypertrophy without LVOT  . NICM (nonischemic cardiomyopathy) ([place])    a. echo 8/10: EF20-25%, mod LAE, severe asymmetric septal hypertrophy (? nonobs. HCM); grade 1 diast dyfxn.  EF 55% echo June 6    Patient Active Problem List   Diagnosis Date Noted  . Pneumonia due to COVID-19 virus 07/19/2020  . Aortic root enlargement ([place]) 10/05/2018  . Onychomycosis 09/20/2018  . Genetic testing 07/27/2018  . Family history of prostate cancer   . Family history of breast cancer   . Troponin level elevated 12/29/2016  . Chest pain with high risk for cardiac etiology 12/28/2016  . Chest pain 12/28/2016  . Left inguinal hernia 01/08/2014  . Insomnia 05/19/2012  . Hypertrophic nonobstructive cardiomyopathy ([place]) 05/04/2011  . Chronic systolic heart failure ([place])   . Non Obstructive CAD by cath 06/2009   . OBESITY, UNSPECIFIED 10/14/2010  . History of  NICM- resolved at last echo 08/20/2009  . Essential hypertension 01/26/2007  . GASTROINTESTINAL HEMORRHAGE 01/26/2007    Past Surgical History:  Procedure Laterality Date  . KNEE ARTHROSCOPY     right    Allergies Patient has no known allergies.  Family History  Problem Relation Age of Onset  . Kidney disease Brother   . Cancer Brother        prostate  . Kidney disease Father   . Diabetes Mother   . Heart attack Mother 41  . Heart disease Mother   . Cancer Sister        breast  . CAD Brother 78       60% blockage  . Breast cancer Sister 64  . Breast cancer Sister 89       PALB2 pos  . Healthy Son   . Healthy Daughter     Social History Social History   Tobacco Use  . Smoking status: Never Smoker  . Smokeless tobacco: Never Used  Substance Use Topics  . Alcohol use: No    Alcohol/week: 0.0 standard drinks  . Drug use: No    Review of Systems  Constitutional: No fever/chills  Musculoskeletal: Negative for back pain. Positive left foot pain.  Skin: Negative for rash. Neurological: Negative for headaches, focal weakness or numbness.  10-point ROS otherwise negative.  ____________________________________________   PHYSICAL EXAM:  VITAL SIGNS: ED Triage Vitals  Enc Vitals Group     BP 01/17/21 1258 (!) 133/92     Pulse Rate 01/17/21 1258 75     Resp 01/17/21 1258 18     Temp 01/17/21 1258 98.3 F (36.8 C)     Temp Source 01/17/21 1258 Oral     SpO2 01/17/21 1258 99 %     Weight 01/17/21 1255 235 lb (106.6 kg)     Height 01/17/21 1255 5' 11"  (1.803 m)   Constitutional: Alert and oriented. Well appearing and in no acute distress. Eyes: Conjunctivae are normal.  Head: Atraumatic. Nose: No congestion/rhinnorhea. Mouth/Throat: Mucous membranes are moist.   Neck: No stridor.   Cardiovascular: Normal rate, regular rhythm. Good peripheral circulation. 2+ DP/PT pulses in the left.  Respiratory: Normal respiratory effort. Gastrointestinal: No  distention.  Musculoskeletal: Mild swelling over the lateral midfoot w/o laceration or cellulitis. Normal ROM of the ankle. No swelling/redness and the 1st MTP joint.  Neurologic:  Normal speech and language. Normal sensation in the left foot.  Skin:  Skin is warm, dry and intact. No rash noted. No cellulitis.   ____________________________________________  RADIOLOGY  DG Foot Complete Left  Result Date: 01/17/2021 CLINICAL DATA:  Left foot and ankle pain for 6 days. Patient reports a history of gout. EXAM: LEFT FOOT - COMPLETE 3+ VIEW COMPARISON:  None. FINDINGS: There is no acute bony or joint abnormality. Mild first MTP osteoarthritis is noted. No erosion or bony destructive change. No abnormal soft tissue calcification. Soft tissues about the foot appear mildly swollen. Plantar calcaneal spur noted. IMPRESSION: Mild soft tissue swelling without underlying acute bony or joint abnormality. Mild first MTP osteoarthritis. No evidence of crystal or inflammatory arthropathy. Electronically Signed   By: Inge Rise M.D.   On: 01/17/2021 13:36    ____________________________________________   PROCEDURES  Procedure(s) performed:   Procedures  None  ____________________________________________   INITIAL IMPRESSION / ASSESSMENT AND PLAN / ED COURSE  Pertinent labs & imaging results that were available during my care of the patient were reviewed by me and considered in my medical decision making (see chart for details).   Patient with history of frequent gout flares presents with pain to the left foot.  The location of patient's discomfort and mild swelling is relatively atypical for gout.  In this setting, plan for plain film of the left foot to evaluate for possible occult/stress fracture.  No known injury or precipitating pain symptoms.  Exam is not consistent with septic joint in the ankle. Patient is not longer anticoagulated.   No acute bony abnormality on plain film.  Plan for  treatment of gout with prednisone and have the patient follow-up with his PCP in the coming week.  Discussed symptoms of cellulitis and septic joint as ED return precautions. ____________________________________________  FINAL CLINICAL IMPRESSION(S) / ED DIAGNOSES  Final diagnoses:  Acute idiopathic gout of left foot    Note:  This document was prepared using Dragon voice recognition software and may include unintentional dictation errors.  Nanda Quinton, MD, [facility] Emergency Medicine    Jurnei Latini, [facility], MD 01/18/21 [phone]

## 2021-01-17 NOTE — ED Triage Notes (Signed)
Pt reports he thinks he has a gout flare up to LT foot/ankle x 6d

## 2021-01-17 NOTE — Discharge Instructions (Signed)
IsYou were seen in the emergency department today with foot pain.  Your x-ray did not show fracture or other abnormality.  I am starting you back on a steroid course which I have called into your pharmacy.  Closely with your primary care doctor and return to the emergency department any fever, development of rash, worsening pain, or other sudden/severe symptoms.

## 2021-02-11 ENCOUNTER — Emergency Department (HOSPITAL_BASED_OUTPATIENT_CLINIC_OR_DEPARTMENT_OTHER)
Admission: EM | Admit: 2021-02-11 | Discharge: 2021-02-11 | Disposition: A | Discharge status: Home / Self Care | Payer: 59 | Attending: Emergency Medicine | Admitting: Emergency Medicine

## 2021-02-11 ENCOUNTER — Other Ambulatory Visit: Payer: Self-pay

## 2021-02-11 ENCOUNTER — Encounter (HOSPITAL_BASED_OUTPATIENT_CLINIC_OR_DEPARTMENT_OTHER): Payer: Self-pay | Admitting: Emergency Medicine

## 2021-02-11 DIAGNOSIS — Z8616 Personal history of COVID-19: Secondary | ICD-10-CM | POA: Diagnosis not present

## 2021-02-11 DIAGNOSIS — I110 Hypertensive heart disease with heart failure: Secondary | ICD-10-CM | POA: Insufficient documentation

## 2021-02-11 DIAGNOSIS — I5022 Chronic systolic (congestive) heart failure: Secondary | ICD-10-CM | POA: Diagnosis not present

## 2021-02-11 DIAGNOSIS — M25571 Pain in right ankle and joints of right foot: Secondary | ICD-10-CM | POA: Diagnosis present

## 2021-02-11 DIAGNOSIS — I2510 Atherosclerotic heart disease of native coronary artery without angina pectoris: Secondary | ICD-10-CM | POA: Insufficient documentation

## 2021-02-11 DIAGNOSIS — Z79899 Other long term (current) drug therapy: Secondary | ICD-10-CM | POA: Insufficient documentation

## 2021-02-11 DIAGNOSIS — M79671 Pain in right foot: Secondary | ICD-10-CM

## 2021-02-11 MED ORDER — HYDROCODONE-ACETAMINOPHEN 5-325 MG PO TABS: 1.0000 | ORAL_TABLET | Freq: Four times a day (QID) | ORAL | 0 refills | Status: DC | PRN

## 2021-02-11 MED ORDER — ONDANSETRON 4 MG PO TBDP: 4.0000 mg | ORAL_TABLET | Freq: Three times a day (TID) | ORAL | 0 refills | Status: DC | PRN

## 2021-02-11 MED ORDER — PREDNISONE 10 MG PO TABS: ORAL_TABLET | ORAL | 0 refills | Status: AC

## 2021-02-11 NOTE — ED Triage Notes (Signed)
Right foot pain, Hx gout . No meds

## 2021-02-11 NOTE — ED Provider Notes (Signed)
[facility] EMERGENCY DEPARTMENT Provider Note   CSN: [id number] Arrival date & time: 02/11/21  1817     History Chief Complaint  Patient presents with  . Foot Pain    Right     Craig Prince is a 65 y.o. male.  HPI      Craig Prince is a 65 y.o. male, with a history of gout, nonischemic cardiomyopathy, HTN, heart failure, presenting to the ED with right foot pain for the past 6 days. His pain is throbbing, severe, starting in the right big toe, now in the right foot and ankle. States he has had the same presentation with gout. Denies fever/chills, other leg pain, numbness, weakness, trauma, or any other complaints.     Past Medical History:  Diagnosis Date  . Arthritis   . CAD (coronary artery disease)    NONOBSTRUCTIVE  a. cath 8/10: prox to mid LAD 20%  . Chronic systolic heart failure ([place])   . Essential hypertension, benign   . Family history of breast cancer   . Family history of prostate cancer   . Gout   . Gout   . Hypertrophic nonobstructive cardiomyopathy ([place])    septal hypertrophy without LVOT  . NICM (nonischemic cardiomyopathy) ([place])    a. echo 8/10: EF20-25%, mod LAE, severe asymmetric septal hypertrophy (? nonobs. HCM); grade 1 diast dyfxn.  EF 55% echo June 6    Patient Active Problem List   Diagnosis Date Noted  . Pneumonia due to COVID-19 virus 07/19/2020  . Aortic root enlargement ([place]) 10/05/2018  . Onychomycosis 09/20/2018  . Genetic testing 07/27/2018  . Family history of prostate cancer   . Family history of breast cancer   . Troponin level elevated 12/29/2016  . Chest pain with high risk for cardiac etiology 12/28/2016  . Chest pain 12/28/2016  . Left inguinal hernia 01/08/2014  . Insomnia 05/19/2012  . Hypertrophic nonobstructive cardiomyopathy ([place]) 05/04/2011  . Chronic systolic heart failure ([place])   . Non Obstructive CAD by cath 06/2009   . OBESITY, UNSPECIFIED 10/14/2010  . History of NICM- resolved at last echo  08/20/2009  . Essential hypertension 01/26/2007  . GASTROINTESTINAL HEMORRHAGE 01/26/2007    Past Surgical History:  Procedure Laterality Date  . KNEE ARTHROSCOPY     right       Family History  Problem Relation Age of Onset  . Kidney disease Brother   . Cancer Brother        prostate  . Kidney disease Father   . Diabetes Mother   . Heart attack Mother 89  . Heart disease Mother   . Cancer Sister        breast  . CAD Brother 22       60% blockage  . Breast cancer Sister 34  . Breast cancer Sister 71       PALB2 pos  . Healthy Son   . Healthy Daughter     Social History   Tobacco Use  . Smoking status: Never Smoker  . Smokeless tobacco: Never Used  Substance Use Topics  . Alcohol use: No    Alcohol/week: 0.0 standard drinks  . Drug use: No    Home Medications Prior to Admission medications   Medication Sig Start Date End Date Taking? Authorizing Provider  HYDROcodone-acetaminophen (NORCO/VICODIN) 5-325 MG tablet Take 1 tablet by mouth every 6 (six) hours as needed for severe pain. 02/11/21  Yes Marea Reasner C, PA-C  ondansetron (ZOFRAN ODT) 4 MG  disintegrating tablet Take 1 tablet (4 mg total) by mouth every 8 (eight) hours as needed for nausea or vomiting. 02/11/21  Yes Regginald Pask C, PA-C  predniSONE (DELTASONE) 10 MG tablet Take 4 tablets (40 mg total) by mouth daily for 5 days, THEN 3 tablets (30 mg total) daily for 1 day, THEN 2 tablets (20 mg total) daily for 1 day, THEN 1 tablet (10 mg total) daily for 1 day. 02/11/21 02/19/21 Yes Makaylen Thieme C, PA-C  acetaminophen (TYLENOL) 325 MG tablet Take 2 tablets (650 mg total) by mouth every 4 (four) hours as needed for headache or mild pain. 12/29/16   Erlene Quan, PA-C  amLODipine (NORVASC) 10 MG tablet Take 1 tablet (10 mg total) by mouth daily. 08/07/18   Minus Breeding, MD  APIXABAN Arne Cleveland) VTE STARTER PACK (10MG AND 5MG) Take as directed on package: start with two-19m tablets twice daily for 7 days. On day 8, switch  to one-567mtablet twice daily. 07/21/20   GrPatrecia PourMD  carvedilol (COREG) 25 MG tablet Take 2 tablets (50 mg total) by mouth 2 (two) times daily with a meal. Please schedule annual appt with Dr. HoPercival Spanishor refills. [phone] attempt. 04/08/20   HoMinus BreedingMD  Cholecalciferol (VITAMIN D-3 PO) Take 1 tablet by mouth daily.    [provider]  CINNAMON PO Take 1 tablet by mouth daily.    [provider]  enalapril (VASOTEC) 20 MG tablet Take 1 tablet (20 mg total) by mouth 2 (two) times daily. 08/29/19   HoMinus BreedingMD  furosemide (LASIX) 40 MG tablet Take 1 tablet (40 mg total) by mouth daily. 08/07/18   HoMinus BreedingMD  Garlic TABS Take 1 tablet by mouth daily.    [provider]  rosuvastatin (CRESTOR) 10 MG tablet Take 10 mg by mouth at bedtime. 02/05/21   [provider]  zolpidem (AMBIEN) 10 MG tablet Take 1 tablet (10 mg total) by mouth at bedtime as needed for sleep. 08/07/18 06/12/22  HoMinus BreedingMD  colchicine 0.6 MG tablet Take 1 tablet (0.6 mg total) by mouth daily. 09/04/20 02/11/21  StLajean SaverMD    Allergies    Patient has no known allergies.  Review of Systems   Review of Systems  Constitutional: Negative for chills and fever.  Respiratory: Negative for shortness of breath.   Musculoskeletal: Positive for arthralgias and joint swelling.  Neurological: Negative for weakness and numbness.    Physical Exam Updated Vital Signs BP 129/88 (BP Location: Left Arm)   Pulse 80   Temp 98.5 F (36.9 C) (Oral)   Resp 20   Ht 5' 11.5" (1.816 m)   Wt 108.9 kg   SpO2 98%   BMI 33.01 kg/m   Physical Exam Vitals and nursing note reviewed.  Constitutional:      General: He is not in acute distress.    Appearance: He is well-developed. He is not diaphoretic.  HENT:     Head: Normocephalic and atraumatic.  Eyes:     Conjunctiva/sclera: Conjunctivae normal.  Cardiovascular:     Rate and Rhythm: Normal rate and  regular rhythm.     Pulses:          Dorsalis pedis pulses are 2+ on the right side.       Posterior tibial pulses are 2+ on the right side.  Pulmonary:     Effort: Pulmonary effort is normal.  Musculoskeletal:     Cervical back: Neck supple.  Comments: Tenderness, erythema, swelling to the right first MTP joint.  Some tenderness in the right dorsal foot and in the right ankle. No pain out of proportion.  No area of fluctuance noted. No pain, tenderness, erythema, increased warmth in the right calf, knee, or rest of the right lower extremity.  Skin:    General: Skin is warm and dry.     Capillary Refill: Capillary refill takes less than 2 seconds.     Coloration: Skin is not pale.  Neurological:     Mental Status: He is alert.     Comments: Sensation light touch grossly intact in the right foot and toes. Strength 5/5 in the right ankle and the rest of the right leg.  Psychiatric:        Behavior: Behavior normal.     ED Results / Procedures / Treatments   Labs (all labs ordered are listed, but only abnormal results are displayed) Labs Reviewed - No data to display  EKG None  Radiology No results found.  Procedures Procedures   Medications Ordered in ED Medications - No data to display  ED Course  I have reviewed the triage vital signs and the nursing notes.  Pertinent labs & imaging results that were available during my care of the patient were reviewed by me and considered in my medical decision making (see chart for details).    MDM Rules/Calculators/A&P                          Patient presents with right toe and foot pain.  Patient endorses similar presentation with previous episodes of gout. Low suspicion for septic arthritis upon consideration of the patient's duration of symptoms and lack of vital sign abnormalities. The patient was given instructions for home care as well as return precautions. Patient voices understanding of these instructions, accepts  the plan, and is comfortable with discharge.   Final Clinical Impression(s) / ED Diagnoses Final diagnoses:  Foot pain, right    Rx / DC Orders ED Discharge Orders         Ordered    predniSONE (DELTASONE) 10 MG tablet        02/11/21 1944    HYDROcodone-acetaminophen (NORCO/VICODIN) 5-325 MG tablet  Every 6 hours PRN        02/11/21 1944    ondansetron (ZOFRAN ODT) 4 MG disintegrating tablet  Every 8 hours PRN        02/11/21 1945           Layla Maw 02/11/21 2030    Davonna Belling, MD 02/11/21 2307

## 2021-02-11 NOTE — Discharge Instructions (Addendum)
  Prednisone: Take the prednisone, as prescribed, until finished. If you are a diabetic, please know prednisone can raise your blood sugar temporarily. Acetaminophen: May take acetaminophen (generic for Tylenol), as needed, for pain. Your daily total maximum amount of acetaminophen from all sources should be limited to 4000mg /day for persons without liver problems, or 2000mg /day for those with liver problems. Vicodin: May take Vicodin (hydrocodone-acetaminophen) as needed for severe pain.   Do not drive or perform other dangerous activities while taking this medication as it can cause drowsiness as well as changes in reaction time and judgement.   Please note that each pill of Vicodin contains 325 mg of acetaminophen (generic for Tylenol) and the above dosage limits apply. Nausea/vomiting: Use the ondansetron (generic for Zofran) for nausea or vomiting.  This medication may not prevent all vomiting or nausea, but can help facilitate better hydration. Things that can help with nausea/vomiting also include peppermint/menthol candies, vitamin B12, and ginger.  Follow-up: Follow-up with primary care provider or orthopedic specialist on this matter.

## 2021-04-03 ENCOUNTER — Other Ambulatory Visit: Payer: Self-pay

## 2021-04-03 ENCOUNTER — Emergency Department (HOSPITAL_BASED_OUTPATIENT_CLINIC_OR_DEPARTMENT_OTHER)
Admission: EM | Admit: 2021-04-03 | Discharge: 2021-04-03 | Disposition: A | Discharge status: Home / Self Care | Payer: 59 | Attending: Emergency Medicine | Admitting: Emergency Medicine

## 2021-04-03 ENCOUNTER — Encounter (HOSPITAL_BASED_OUTPATIENT_CLINIC_OR_DEPARTMENT_OTHER): Payer: Self-pay

## 2021-04-03 DIAGNOSIS — Z8616 Personal history of COVID-19: Secondary | ICD-10-CM | POA: Insufficient documentation

## 2021-04-03 DIAGNOSIS — I5022 Chronic systolic (congestive) heart failure: Secondary | ICD-10-CM | POA: Diagnosis not present

## 2021-04-03 DIAGNOSIS — I110 Hypertensive heart disease with heart failure: Secondary | ICD-10-CM | POA: Insufficient documentation

## 2021-04-03 DIAGNOSIS — Z79899 Other long term (current) drug therapy: Secondary | ICD-10-CM | POA: Insufficient documentation

## 2021-04-03 DIAGNOSIS — M25571 Pain in right ankle and joints of right foot: Secondary | ICD-10-CM | POA: Diagnosis present

## 2021-04-03 DIAGNOSIS — I2510 Atherosclerotic heart disease of native coronary artery without angina pectoris: Secondary | ICD-10-CM | POA: Diagnosis not present

## 2021-04-03 DIAGNOSIS — M10071 Idiopathic gout, right ankle and foot: Secondary | ICD-10-CM | POA: Insufficient documentation

## 2021-04-03 DIAGNOSIS — M79671 Pain in right foot: Secondary | ICD-10-CM | POA: Diagnosis not present

## 2021-04-03 DIAGNOSIS — M109 Gout, unspecified: Secondary | ICD-10-CM

## 2021-04-03 MED ORDER — ONDANSETRON 4 MG PO TBDP: 4.0000 mg | ORAL_TABLET | Freq: Three times a day (TID) | ORAL | 0 refills | Status: DC | PRN

## 2021-04-03 MED ORDER — PREDNISONE 50 MG PO TABS
60.0000 mg | ORAL_TABLET | Freq: Once | ORAL | Status: AC
  Administered 2021-04-03: 60 mg via ORAL
  Filled 2021-04-03: qty 1

## 2021-04-03 MED ORDER — PREDNISONE 20 MG PO TABS: ORAL_TABLET | ORAL | 0 refills | Status: DC

## 2021-04-03 MED ORDER — KETOROLAC TROMETHAMINE 15 MG/ML IJ SOLN
15.0000 mg | Freq: Once | INTRAMUSCULAR | Status: AC
  Administered 2021-04-03: 15 mg via INTRAMUSCULAR
  Filled 2021-04-03: qty 1

## 2021-04-03 MED ORDER — OXYCODONE HCL 5 MG PO TABS: 2.5000 mg | ORAL_TABLET | Freq: Four times a day (QID) | ORAL | 0 refills | Status: DC | PRN

## 2021-04-03 NOTE — Discharge Instructions (Signed)
Please read and follow all provided instructions.  Your diagnoses today include:  1. Acute gout of right ankle, unspecified cause     Tests performed today include:  Vital signs. See below for your results today.   Medications prescribed:   Prednisone - steroid medicine   It is best to take this medication in the morning to prevent sleeping problems. If you are diabetic, monitor your blood sugar closely and stop taking Prednisone if blood sugar is over 300. Take with food to prevent stomach upset.    Oxycodone - narcotic pain medication  DO NOT drive or perform any activities that require you to be awake and alert because this medicine can make you drowsy.    Zofran (ondansetron) - for nausea and vomiting  Take any prescribed medications only as directed.  Home care instructions:   Follow any educational materials contained in this packet  Follow R.I.C.E. Protocol:  R - rest your injury   I  - use ice on injury without applying directly to skin  C - compress injury with bandage or splint  E - elevate the injury as much as possible  Follow-up instructions: Please follow-up with your primary care provider or the provided sports medicine physician.   Return instructions:   Please return if your toes or feet are numb or tingling, appear gray or blue, or you have severe pain (also elevate the leg and loosen splint or wrap if you were given one)  Please return to the Emergency Department if you experience worsening symptoms.   Please return if you have any other emergent concerns.  Additional Information:  Your vital signs today were: BP (!) 156/113 (BP Location: Right Arm)   Pulse 78   Temp 98.4 F (36.9 C) (Oral)   Resp 18   Ht 6' (1.829 m)   Wt 108.9 kg   SpO2 98%   BMI 32.55 kg/m  If your blood pressure (BP) was elevated above 135/85 this visit, please have this repeated by your doctor within one month. --------------

## 2021-04-03 NOTE — ED Notes (Signed)
ED Provider at bedside. 

## 2021-04-03 NOTE — ED Provider Notes (Signed)
[name] Provider Note   CSN: [id number] Arrival date & time: 04/03/21  1906     History Chief Complaint  Patient presents with  . Foot Pain    Craig Prince is a 65 y.o. male.  Patient with history of nonobstructive coronary artery disease, repeated gout flares --presents to the emergency department for evaluation of right foot and ankle pain.  Patient states that his symptoms have once again become worse.  He has been trying to tough it out over the past 2 weeks.  Pain radiates up into the leg at times.  He has had swelling of the foot and ankle consistent with previous gout flares.  He states that he had fluid drained when he had gout in his knee in the past to confirm the diagnosis.  Also reports having gout in his elbow at times.  Typically improved with prednisone.  Patient has been on 2 previous courses just this year.  He denies fever.  Pain is worse with movement and bearing weight.  He states that he has been prescribed allopurinol in the past but had a syncopal episode after taking it.  He is not currently on any preventative or controller medications.        Past Medical History:  Diagnosis Date  . Arthritis   . CAD (coronary artery disease)    NONOBSTRUCTIVE  a. cath 8/10: prox to mid LAD 20%  . Chronic systolic heart failure ([place])   . Essential hypertension, benign   . Family history of breast cancer   . Family history of prostate cancer   . Gout   . Gout   . Hypertrophic nonobstructive cardiomyopathy ([place])    septal hypertrophy without LVOT  . NICM (nonischemic cardiomyopathy) ([place])    a. echo 8/10: EF20-25%, mod LAE, severe asymmetric septal hypertrophy (? nonobs. HCM); grade 1 diast dyfxn.  EF 55% echo June 6    Patient Active Problem List   Diagnosis Date Noted  . Pneumonia due to COVID-19 virus 07/19/2020  . Aortic root enlargement ([place]) 10/05/2018  . Onychomycosis 09/20/2018  . Genetic testing 07/27/2018  . Family  history of prostate cancer   . Family history of breast cancer   . Troponin level elevated 12/29/2016  . Chest pain with high risk for cardiac etiology 12/28/2016  . Chest pain 12/28/2016  . Left inguinal hernia 01/08/2014  . Insomnia 05/19/2012  . Hypertrophic nonobstructive cardiomyopathy ([place]) 05/04/2011  . Chronic systolic heart failure ([place])   . Non Obstructive CAD by cath 06/2009   . OBESITY, UNSPECIFIED 10/14/2010  . History of NICM- resolved at last echo 08/20/2009  . Essential hypertension 01/26/2007  . GASTROINTESTINAL HEMORRHAGE 01/26/2007    Past Surgical History:  Procedure Laterality Date  . KNEE ARTHROSCOPY     right       Family History  Problem Relation Age of Onset  . Kidney disease Brother   . Cancer Brother        prostate  . Kidney disease Father   . Diabetes Mother   . Heart attack Mother 74  . Heart disease Mother   . Cancer Sister        breast  . CAD Brother 32       60% blockage  . Breast cancer Sister 44  . Breast cancer Sister 81       PALB2 pos  . Healthy Son   . Healthy Daughter     Social History   Tobacco  Use  . Smoking status: Never Smoker  . Smokeless tobacco: Never Used  Substance Use Topics  . Alcohol use: No    Alcohol/week: 0.0 standard drinks  . Drug use: No    Home Medications Prior to Admission medications   Medication Sig Start Date End Date Taking? Authorizing Provider  ondansetron (ZOFRAN ODT) 4 MG disintegrating tablet Take 1 tablet (4 mg total) by mouth every 8 (eight) hours as needed for nausea or vomiting. 04/03/21  Yes Carlisle Cater, PA-C  oxyCODONE (OXY IR/ROXICODONE) 5 MG immediate release tablet Take 0.5-1 tablets (2.5-5 mg total) by mouth every 6 (six) hours as needed for severe pain. 04/03/21  Yes Carlisle Cater, PA-C  predniSONE (DELTASONE) 20 MG tablet 3 Tabs PO Days 1-3, then 2 tabs PO Days 4-6, then 1 tab PO Day 7-9, then Half Tab PO Day 10-12 04/03/21  Yes Carlisle Cater, PA-C  acetaminophen (TYLENOL)  325 MG tablet Take 2 tablets (650 mg total) by mouth every 4 (four) hours as needed for headache or mild pain. 12/29/16   Erlene Quan, PA-C  amLODipine (NORVASC) 10 MG tablet Take 1 tablet (10 mg total) by mouth daily. 08/07/18   Minus Breeding, MD  carvedilol (COREG) 25 MG tablet Take 2 tablets (50 mg total) by mouth 2 (two) times daily with a meal. Please schedule annual appt with Dr. Percival Spanish for refills. [phone]. 3rd attempt. 04/08/20   Minus Breeding, MD  Cholecalciferol (VITAMIN D-3 PO) Take 1 tablet by mouth daily.    [provider]  CINNAMON PO Take 1 tablet by mouth daily.    [provider]  enalapril (VASOTEC) 20 MG tablet Take 1 tablet (20 mg total) by mouth 2 (two) times daily. 08/29/19   Minus Breeding, MD  furosemide (LASIX) 40 MG tablet Take 1 tablet (40 mg total) by mouth daily. 08/07/18   Minus Breeding, MD  Garlic TABS Take 1 tablet by mouth daily.    [provider]  rosuvastatin (CRESTOR) 10 MG tablet Take 10 mg by mouth at bedtime. 02/05/21   [provider]  zolpidem (AMBIEN) 10 MG tablet Take 1 tablet (10 mg total) by mouth at bedtime as needed for sleep. 08/07/18 06/12/22  Minus Breeding, MD  APIXABAN Arne Cleveland) VTE STARTER PACK (10MG AND 5MG) Take as directed on package: start with two-31m tablets twice daily for 7 days. On day 8, switch to one-527mtablet twice daily. 07/21/20 04/03/21  GrPatrecia PourMD  colchicine 0.6 MG tablet Take 1 tablet (0.6 mg total) by mouth daily. 09/04/20 02/11/21  StLajean SaverMD    Allergies    Patient has no known allergies.  Review of Systems   Review of Systems  Constitutional: Negative for activity change.  Gastrointestinal: Negative for nausea and vomiting.  Musculoskeletal: Positive for arthralgias, gait problem and joint swelling. Negative for back pain and neck pain.  Skin: Negative for wound.  Neurological: Negative for weakness and numbness.    Physical Exam Updated Vital Signs BP (!)  165/110 (BP Location: Right Arm)   Pulse 98   Temp 98.4 F (36.9 C) (Oral)   Resp 16   Ht 6' (1.829 m)   Wt 108.9 kg   SpO2 100%   BMI 32.55 kg/m   Physical Exam Vitals and nursing note reviewed.  Constitutional:      Appearance: He is well-developed.  HENT:     Head: Normocephalic and atraumatic.  Eyes:     Conjunctiva/sclera: Conjunctivae normal.  Cardiovascular:  Pulses: Normal pulses. No decreased pulses.  Musculoskeletal:        General: Tenderness present.     Cervical back: Normal range of motion and neck supple.     Right knee: Normal range of motion. No tenderness.     Right ankle: Swelling present. Tenderness present. Decreased range of motion.     Right foot: Decreased range of motion. Swelling (Generalized) and tenderness present.  Skin:    General: Skin is warm and dry.  Neurological:     Mental Status: He is alert.     Sensory: No sensory deficit.     Comments: Motor, sensation, and vascular distal to the injury is fully intact.      ED Results / Procedures / Treatments   Labs (all labs ordered are listed, but only abnormal results are displayed) Labs Reviewed - No data to display  EKG None  Radiology No results found.  Procedures Procedures   Medications Ordered in ED Medications  predniSONE (DELTASONE) tablet 60 mg (60 mg Oral Given 04/03/21 1953)  ketorolac (TORADOL) 15 MG/ML injection 15 mg (15 mg Intramuscular Given 04/03/21 1954)    ED Course  I have reviewed the triage vital signs and the nursing notes.  Pertinent labs & imaging results that were available during my care of the patient were reviewed by me and considered in my medical decision making (see chart for details).  Patient seen and examined.  Reviewed past several ED visits during which she was treated for gout.  Difficulty receives taper course of prednisone.  Patient would benefit from PCP/orthopedic follow-up as he is not currently on any preventative medications or does  not have a distinct regimen on what to do when he starts to develop flares.  I do not suspect septic arthritis.  He looks well, nontoxic.  Will give dose of prednisone, Toradol here.  Home with short prescription of oxycodone.  We will discharge home with sports medicine follow-up.  Patient counseled on use of narcotic pain medications. Urged not to drink alcohol, drive, or perform any other activities that requires focus while taking these medications. The patient verbalizes understanding and agrees with the plan.   Vital signs reviewed and are as follows: BP (!) 165/110 (BP Location: Right Arm)   Pulse 98   Temp 98.4 F (36.9 C) (Oral)   Resp 16   Ht 6' (1.829 m)   Wt 108.9 kg   SpO2 100%   BMI 32.55 kg/m     MDM Rules/Calculators/A&P                          Patient with symptoms consistent with gouty arthritis.  Doubt septic arthritis.  No signs of cellulitis.  Patient looks well.  Plan as above.   Final Clinical Impression(s) / ED Diagnoses Final diagnoses:  Acute gout of right ankle, unspecified cause    Rx / DC Orders ED Discharge Orders         Ordered    predniSONE (DELTASONE) 20 MG tablet        04/03/21 1952    oxyCODONE (OXY IR/ROXICODONE) 5 MG immediate release tablet  Every 6 hours PRN        04/03/21 1952    ondansetron (ZOFRAN ODT) 4 MG disintegrating tablet  Every 8 hours PRN        04/03/21 1952           Carlisle Cater, PA-C 04/03/21 2017  Little, Wenda Overland, MD 04/06/21 [phone]

## 2021-04-03 NOTE — ED Triage Notes (Signed)
Pt c/o "gout pain" to right foot and ankle x 12-14 days-NAD-to triage in w/c

## 2021-07-12 NOTE — Progress Notes (Signed)
Cardiology Office Note   Date:  07/13/2021   ID:  Craig Prince, DOB [birth year], MRN [id number]  PCP:  Charlane Ferretti, DO  Cardiologist:   None   Chief Complaint  Patient presents with   Cardiomyopathy       History of Present Illness: Craig Prince is a 65 y.o. male who presents for followup of his hypertension and cardiomyopathy. He has a nonischemic cardiomyopathy with an original ejection fraction of 25% to 50% on followup in 2012 and 14.  I last saw him in  2019.  He has had a CT coronary angiogram with normal coronaries and zero calcium score.    Cardiac MRI demonstrated extensive mid wall gadolinium enhancement with septal wall thickening all supporting the diagnosis of hypertrophic cardiomyopathy.  An echocardiogram suggested that his EF was about 50%.  There was some septal anterior motion of the mitral valve.  However, there was no significant gradient.  He wore a Holter and there was no evidence of arrhythmias.   He said his insurance changed and he could not be seen here.  I do see from Care Everywhere that he was seen at least once at [facility].  I was able to see an echocardiogram.  He had a 42 mm aorta.  It looks like he had 20 mm septal thickening with an EF of 65 to 70%.  They describe severe septal hypertrophy but no significant gradient or SAM.  He is actually done quite well.  He denies any cardiovascular symptoms other than some mild right leg swelling at the end of his [employer] shift.  He has had a little numbness and tingling in his right leg at times.  He denies any chest pressure, neck or arm discomfort.  He had 1 episode of palpitations recently that he thought was on because he has not had this.  He has not had any presyncope or syncope.  He is unfortunately not been particularly physically active.  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   Past Medical History:  Diagnosis Date   Arthritis    CAD (coronary artery disease)    NONOBSTRUCTIVE  a. cath  8/10: prox to mid LAD 20%   Chronic systolic heart failure (HCC)    Essential hypertension, benign    Family history of breast cancer    Family history of prostate cancer    Gout    Gout    Hypertrophic nonobstructive cardiomyopathy (HCC)    septal hypertrophy without LVOT   NICM (nonischemic cardiomyopathy) (HCC)    a. echo 8/10: EF20-25%, mod LAE, severe asymmetric septal hypertrophy (? nonobs. HCM); grade 1 diast dyfxn.  EF 55% echo June 6    Past Surgical History:  Procedure Laterality Date   KNEE ARTHROSCOPY     right     Current Outpatient Medications  Medication Sig Dispense Refill   acetaminophen (TYLENOL) 325 MG tablet Take 2 tablets (650 mg total) by mouth every 4 (four) hours as needed for headache or mild pain.     allopurinol (ZYLOPRIM) 100 MG tablet Take 100 mg by mouth daily.     amLODipine (NORVASC) 10 MG tablet Take 1 tablet (10 mg total) by mouth daily. 90 tablet 3   carvedilol (COREG) 25 MG tablet Take 2 tablets (50 mg total) by mouth 2 (two) times daily with a meal. Please schedule annual appt with Dr. Antoine Poche for refills. [phone]. 3rd attempt. 15 tablet 0   Cholecalciferol (VITAMIN D-3 PO)  Take 1 tablet by mouth daily.     CINNAMON PO Take 1 tablet by mouth daily.     enalapril (VASOTEC) 20 MG tablet Take 1 tablet (20 mg total) by mouth 2 (two) times daily. 180 tablet 1   furosemide (LASIX) 40 MG tablet Take 1 tablet (40 mg total) by mouth daily. 90 tablet 3   ondansetron (ZOFRAN ODT) 4 MG disintegrating tablet Take 1 tablet (4 mg total) by mouth every 8 (eight) hours as needed for nausea or vomiting. 10 tablet 0   oxyCODONE (OXY IR/ROXICODONE) 5 MG immediate release tablet Take 0.5-1 tablets (2.5-5 mg total) by mouth every 6 (six) hours as needed for severe pain. 5 tablet 0   predniSONE (DELTASONE) 20 MG tablet 3 Tabs PO Days 1-3, then 2 tabs PO Days 4-6, then 1 tab PO Day 7-9, then Half Tab PO Day 10-12 (Patient taking differently: As Needed 3 Tabs PO Days  1-3, then 2 tabs PO Days 4-6, then 1 tab PO Day 7-9, then Half Tab PO Day 10-12) 20 tablet 0   Garlic TABS Take 1 tablet by mouth daily. (Patient not taking: Reported on 07/13/2021)     rosuvastatin (CRESTOR) 10 MG tablet Take 10 mg by mouth at bedtime. (Patient not taking: Reported on 07/13/2021)     zolpidem (AMBIEN) 10 MG tablet Take 1 tablet (10 mg total) by mouth at bedtime as needed for sleep. (Patient not taking: Reported on 07/13/2021) 20 tablet 0   No current facility-administered medications for this visit.    Allergies:   Patient has no known allergies.    Social History:  The patient  reports that he has never smoked. He has never used smokeless tobacco. He reports that he does not drink alcohol and does not use drugs.   Family History:  The patient's family history includes Breast cancer (age of onset: 67) in his sister; Breast cancer (age of onset: 43) in his sister; CAD (age of onset: 61) in his brother; Cancer in his brother and sister; Diabetes in his mother; Healthy in his daughter and son; Heart attack (age of onset: 46) in his mother; Heart disease in his mother; Kidney disease in his brother and father.    ROS:  Please see the history of present illness.   Otherwise, review of systems are positive for gout.   All other systems are reviewed and negative.    PHYSICAL EXAM: VS:  BP 120/80 (BP Location: Right Arm)   Pulse 70   Ht 5\' 10"  (1.778 m)   Wt 228 lb 9.6 oz (103.7 kg)   SpO2 97%   BMI 32.80 kg/m  , BMI Body mass index is 32.8 kg/m. GENERAL:  Well appearing HEENT:  Pupils equal round and reactive, fundi not visualized, oral mucosa unremarkable NECK:  No jugular venous distention, waveform within normal limits, carotid upstroke brisk and symmetric, no bruits, no thyromegaly LYMPHATICS:  No cervical, inguinal adenopathy LUNGS:  Clear to auscultation bilaterally BACK:  No CVA tenderness CHEST:  Unremarkable HEART:  PMI not displaced or sustained,S1 and S2 within  normal limits, no S3, no S4, no clicks, no rubs, no murmurs ABD:  Flat, positive bowel sounds normal in frequency in pitch, no bruits, no rebound, no guarding, no midline pulsatile mass, no hepatomegaly, no splenomegaly EXT:  2 plus pulses throughout, no edema, no cyanosis no clubbing SKIN:  No rashes no nodules NEURO:  Cranial nerves II through XII grossly intact, motor grossly intact throughout PSYCH:  Cognitively  intact, oriented to person place and time    EKG:  EKG is ordered today. The ekg ordered today demonstrates sinus rhythm, rate 70, left axis deviation, interventricular conduction delay, interpolated PVC, left ventricular hypertrophy   Recent Labs: 07/19/2020: B Natriuretic Peptide 137.4 07/21/2020: ALT 44; BUN 20; Creatinine, Ser 0.96; Hemoglobin 11.9; Magnesium 2.1; Platelets 242; Potassium 4.2; Sodium 134    Lipid Panel    Component Value Date/Time   CHOL  07/27/2009 0500    193        ATP III CLASSIFICATION:  <200     mg/dL   Desirable  563-875  mg/dL   Borderline High  >=643    mg/dL   High          TRIG 77 07/27/2009 0500   HDL 35 (L) 07/27/2009 0500   CHOLHDL 5.5 07/27/2009 0500   VLDL 15 07/27/2009 0500   LDLCALC (H) 07/27/2009 0500    143        Total Cholesterol/HDL:CHD Risk Coronary Heart Disease Risk Table                     Men   Women  1/2 Average Risk   3.4   3.3  Average Risk       5.0   4.4  2 X Average Risk   9.6   7.1  3 X Average Risk  23.4   11.0        Use the calculated Patient Ratio above and the CHD Risk Table to determine the patient's CHD Risk.        ATP III CLASSIFICATION (LDL):  <100     mg/dL   Optimal  329-518  mg/dL   Near or Above                    Optimal  130-159  mg/dL   Borderline  841-660  mg/dL   High  >630     mg/dL   Very High      Wt Readings from Last 3 Encounters:  07/13/21 228 lb 9.6 oz (103.7 kg)  04/03/21 240 lb (108.9 kg)  02/11/21 240 lb (108.9 kg)      Other studies Reviewed: Additional  studies/ records that were reviewed today include: [facility] records. Review of the above records demonstrates:  Please see elsewhere in the note.     ASSESSMENT AND PLAN:  HCM:  He does not have any symptoms related to this.  He does not have any given his palpitations high risk features for sudden cardiac death although I am going to place a 3-day monitor given his palpitations.  I think the most important issue here is to reemphasize as we did before the need for genetic testing.  We tried to do this before but he never had it done.   I talked to him he had great length and went through the physiology of this.  He has 14 and his family brothers and sisters and his 7 children.  There has been no sudden death.  I will arrange an appointment with Dr. Jomarie Longs.   HTN:  His blood pressure is controlled.  No change in therapy.    CHEST PAIN:  He had a calcium score of zero and normal coronaries.   He is no longer describing chest pain.  No change in therapy.    AO ENLARGEMENT:  The aorta measured 4.1 cm in August 2021.    I will follow  this up with CT in the future.  Current medicines are reviewed at length with the patient today.  The patient does not have concerns regarding medicines.  The following changes have been made:  no change  Labs/ tests ordered today include:   Orders Placed This Encounter  Procedures   Ambulatory referral to Genetics   LONG TERM MONITOR (3-14 DAYS)   EKG 12-Lead      Disposition:   FU with in six months.     Signed, Rollene Rotunda, MD  07/13/2021 11:02 AM    [facility]

## 2021-07-13 ENCOUNTER — Ambulatory Visit: Payer: 59

## 2021-07-13 ENCOUNTER — Other Ambulatory Visit: Payer: Self-pay

## 2021-07-13 ENCOUNTER — Encounter: Payer: Self-pay | Admitting: Cardiology

## 2021-07-13 ENCOUNTER — Ambulatory Visit (INDEPENDENT_AMBULATORY_CARE_PROVIDER_SITE_OTHER): Payer: 59 | Admitting: Cardiology

## 2021-07-13 VITALS — BP 120/80 | HR 70 | Ht 70.0 in | Wt 228.6 lb

## 2021-07-13 DIAGNOSIS — I422 Other hypertrophic cardiomyopathy: Secondary | ICD-10-CM | POA: Diagnosis not present

## 2021-07-13 DIAGNOSIS — R002 Palpitations: Secondary | ICD-10-CM

## 2021-07-13 DIAGNOSIS — I7789 Other specified disorders of arteries and arterioles: Secondary | ICD-10-CM

## 2021-07-13 DIAGNOSIS — I10 Essential (primary) hypertension: Secondary | ICD-10-CM | POA: Diagnosis not present

## 2021-07-13 NOTE — Progress Notes (Unsigned)
Patient enrolled for [employer] to mail a 3 day ZIO XT monitor to his home. 

## 2021-07-13 NOTE — Patient Instructions (Signed)
°Testing/Procedures: ° °ZIO XT- Long Term Monitor Instructions ° °Your physician has requested you wear a ZIO patch monitor for 3 days.  °This is a single patch monitor. [employer] supplies one patch monitor per enrollment. Additional °stickers are not available. Please do not apply patch if you will be having a Nuclear Stress Test,  °Echocardiogram, Cardiac CT, MRI, or Chest Xray during the period you would be wearing the  °monitor. The patch cannot be worn during these tests. You cannot remove and re-apply the  °ZIO XT patch monitor.  °Your ZIO patch monitor will be mailed 3 day USPS to your address on file. It may take 3-5 days  °to receive your monitor after you have been enrolled.  °Once you have received your monitor, please review the enclosed instructions. Your monitor  °has already been registered assigning a specific monitor serial # to you. ° °Billing and Patient Assistance Program Information ° °We have supplied [employer] with any of your insurance information on file for billing purposes. °[employer] offers a sliding scale Patient Assistance Program for patients that do not have  °insurance, or whose insurance does not completely cover the cost of the ZIO monitor.  °You must apply for the Patient Assistance Program to qualify for this discounted rate.  °To apply, please call [employer] at [phone], select option 4, select option 2, ask to apply for  °Patient Assistance Program. [employer] will ask your household income, and how many people  °are in your household. They will quote your out-of-pocket cost based on that information.  °[employer] will also be able to set up a 12-month, interest-free payment plan if needed. ° °Applying the monitor °  °Shave hair from upper left chest.  °Hold abrader disc by orange tab. Rub abrader in 40 strokes over the upper left chest as  °indicated in your monitor instructions.  °Clean area with 4 enclosed alcohol pads. Let dry.  °Apply patch as indicated in monitor instructions.  Patch will be placed under collarbone on left  °side of chest with arrow pointing upward.  °Rub patch adhesive wings for 2 minutes. Remove white label marked "1". Remove the white  °label marked "2". Rub patch adhesive wings for 2 additional minutes.  °While looking in a mirror, press and release button in center of patch. A small green light will  °flash 3-4 times. This will be your only indicator that the monitor has been turned on.  °Do not shower for the first 24 hours. You may shower after the first 24 hours.  °Press the button if you feel a symptom. You will hear a small click. Record Date, Time and  °Symptom in the Patient Logbook.  °When you are ready to remove the patch, follow instructions on the last 2 pages of Patient  °Logbook. Stick patch monitor onto the last page of Patient Logbook.  °Place Patient Logbook in the blue and white box. Use locking tab on box and tape box closed  °securely. The blue and white box has prepaid postage on it. Please place it in the mailbox as  °soon as possible. Your physician should have your test results approximately 7 days after the  °monitor has been mailed back to [employer].  °Call [employer] Customer Care at [phone] if you have questions regarding  °your ZIO XT patch monitor. Call them immediately if you see an orange light blinking on your  °monitor.  °If your monitor falls off in less than 4 days, contact our Monitor department at [phone].  °  If your monitor becomes loose or falls off after 4 days call [employer] at [phone] for  °suggestions on securing your monitor  ° ° °Follow-Up: °At [facility], you and your health needs are our priority.  As part of our continuing mission to provide you with exceptional heart care, we have created designated Provider Care Teams.  These Care Teams include your primary Cardiologist (physician) and Advanced Practice Providers (APPs -  Physician Assistants and Nurse Practitioners) who all work together to  provide you with the care you need, when you need it. ° °We recommend signing up for the patient portal called "MyChart".  Sign up information is provided on this After Visit Summary.  MyChart is used to connect with patients for Virtual Visits (Telemedicine).  Patients are able to view lab/test results, encounter notes, upcoming appointments, etc.  Non-urgent messages can be sent to your provider as well.   °To learn more about what you can do with MyChart, go to [email or web address].   ° °Your next appointment:   °6 month(s) ° °The format for your next appointment:   °In Person ° °Provider:   °James Hochrein, MD   ° ° °

## 2021-09-08 ENCOUNTER — Encounter: Payer: 59 | Admitting: Genetic Counselor

## 2021-09-13 ENCOUNTER — Other Ambulatory Visit: Payer: Self-pay | Admitting: Cardiology

## 2022-01-03 NOTE — Progress Notes (Signed)
Cardiology Office Note   Date:  01/04/2022   ID:  Craig Prince, DOB 1956/07/06, MRN [id number]  PCP:  Charlane Ferretti, DO  Cardiologist:   None   Chief Complaint  Patient presents with   Cardiomyopathy       History of Present Illness: Craig Prince is a 66 y.o. male who presents for followup of his hypertension and cardiomyopathy. He has a nonischemic cardiomyopathy with an original ejection fraction of 25% to 50% on followup in 2012 and 14.  I last saw him in  2019.  He has had a CT coronary angiogram with normal coronaries and zero calcium score.    Cardiac MRI demonstrated extensive mid wall gadolinium enhancement with septal wall thickening all supporting the diagnosis of hypertrophic cardiomyopathy.  An echocardiogram suggested that his EF was about 50%.  There was some septal anterior motion of the mitral valve.  However, there was no significant gradient.  He wore a Holter and there was no evidence of arrhythmias. He was seen previously at [facility].  I was able to see an echocardiogram.  He had a 42 mm aorta.  It looks like he had 20 mm septal thickening with an EF of 65 to 70%.  They describe severe septal hypertrophy but no significant gradient or SAM.  Since I last saw him he has had no new cardiovascular complaints.  He gets some fleeting chest discomfort.  He feels some rare palpitations.  He has not had any sustained tachyarrhythmias.  He has no presyncope or syncope.  He has no new shortness of breath, PND or orthopnea.  He has had no weight gain or edema.  Of note he is a limited by back and joint pains.  He has a cane that he walks with.  He does not do a lot of physical activity.  He is still doing some driving as a part-time job   Past Medical History:  Diagnosis Date   Arthritis    CAD (coronary artery disease)    NONOBSTRUCTIVE  a. cath 8/10: prox to mid LAD 20%   Chronic systolic heart failure (HCC)    Essential hypertension, benign    Family history of  breast cancer    Family history of prostate cancer    Gout    Gout    Hypertrophic nonobstructive cardiomyopathy (HCC)    septal hypertrophy without LVOT   NICM (nonischemic cardiomyopathy) (HCC)    a. echo 8/10: EF20-25%, mod LAE, severe asymmetric septal hypertrophy (? nonobs. HCM); grade 1 diast dyfxn.  EF 55% echo June 6    Past Surgical History:  Procedure Laterality Date   KNEE ARTHROSCOPY     right     Current Outpatient Medications  Medication Sig Dispense Refill   acetaminophen (TYLENOL) 325 MG tablet Take 2 tablets (650 mg total) by mouth every 4 (four) hours as needed for headache or mild pain.     allopurinol (ZYLOPRIM) 100 MG tablet Take 100 mg by mouth daily.     amLODipine (NORVASC) 10 MG tablet Take 1 tablet (10 mg total) by mouth daily. 90 tablet 3   carvedilol (COREG) 25 MG tablet TAKE 2 TABLETS BY MOUTH TWICE DAILY WITH MEALS 120 tablet 3   Cholecalciferol (VITAMIN D-3 PO) Take 1 tablet by mouth daily.     CINNAMON PO Take 1 tablet by mouth daily.     enalapril (VASOTEC) 20 MG tablet TAKE 1 TABLET(20 MG) BY MOUTH DAILY 90 tablet 3  furosemide (LASIX) 40 MG tablet Take 1 tablet (40 mg total) by mouth daily. 90 tablet 3   Garlic TABS Take 1 tablet by mouth daily.     ondansetron (ZOFRAN ODT) 4 MG disintegrating tablet Take 1 tablet (4 mg total) by mouth every 8 (eight) hours as needed for nausea or vomiting. 10 tablet 0   oxyCODONE (OXY IR/ROXICODONE) 5 MG immediate release tablet Take 0.5-1 tablets (2.5-5 mg total) by mouth every 6 (six) hours as needed for severe pain. 5 tablet 0   rosuvastatin (CRESTOR) 10 MG tablet Take 10 mg by mouth at bedtime.     No current facility-administered medications for this visit.    Allergies:   Patient has no known allergies.    ROS:  Please see the history of present illness.   Otherwise, review of systems are positive for none.   All other systems are reviewed and negative.    PHYSICAL EXAM: VS:  BP 137/88    Pulse 65     Ht 5' 10.5" (1.791 m)    Wt 236 lb 9.6 oz (107.3 kg)    SpO2 99%    BMI 33.47 kg/m  , BMI Body mass index is 33.47 kg/m. GENERAL:  Well appearing NECK:  No jugular venous distention, waveform within normal limits, carotid upstroke brisk and symmetric, no bruits, no thyromegaly LUNGS:  Clear to auscultation bilaterally CHEST:  Unremarkable HEART:  PMI not displaced or sustained,S1 and S2 within normal limits, no S3, no S4, no clicks, no rubs, no murmurs ABD:  Flat, positive bowel sounds normal in frequency in pitch, no bruits, no rebound, no guarding, no midline pulsatile mass, no hepatomegaly, no splenomegaly EXT:  2 plus pulses throughout, no edema, no cyanosis no clubbing   EKG:  EKG is  ordered today. The ekg ordered today demonstrates sinus rhythm, rate 68, left axis deviation, interventricular conduction delay, interpolated PVC, left ventricular hypertrophy   Recent Labs: No results found for requested labs within last 8760 hours.    Lipid Panel    Component Value Date/Time   CHOL  07/27/2009 0500    193        ATP III CLASSIFICATION:  <200     mg/dL   Desirable  614-431  mg/dL   Borderline High  >=540    mg/dL   High          TRIG 77 07/27/2009 0500   HDL 35 (L) 07/27/2009 0500   CHOLHDL 5.5 07/27/2009 0500   VLDL 15 07/27/2009 0500   LDLCALC (H) 07/27/2009 0500    143        Total Cholesterol/HDL:CHD Risk Coronary Heart Disease Risk Table                     Men   Women  1/2 Average Risk   3.4   3.3  Average Risk       5.0   4.4  2 X Average Risk   9.6   7.1  3 X Average Risk  23.4   11.0        Use the calculated Patient Ratio above and the CHD Risk Table to determine the patient's CHD Risk.        ATP III CLASSIFICATION (LDL):  <100     mg/dL   Optimal  086-761  mg/dL   Near or Above                    Optimal  130-159  mg/dL   Borderline  884-166  mg/dL   High  >063     mg/dL   Very High      Wt Readings from Last 3 Encounters:  01/04/22 236 lb  9.6 oz (107.3 kg)  07/13/21 228 lb 9.6 oz (103.7 kg)  04/03/21 240 lb (108.9 kg)      Other studies Reviewed: Additional studies/ records that were reviewed today include: Labs Review of the above records demonstrates:  Please see elsewhere in the note.     ASSESSMENT AND PLAN:  HCM: We had another long discussion about this physiology.  Previously set him up for genetic testing but he canceled this and he agrees to reschedule this.  I previously sent him a 3-day ZIO to look for high risk arrhythmias and he did not wear it.  He agrees to try this again.  He has 7 children.  He has a family of 45 brothers and sisters.  We discussed the importance of screening.   HTN:  His blood pressure is controlled.  No change in therapy.   CHEST PAIN:  He had a calcium score of zero and normal coronaries.  No further work-up.  AO ENLARGEMENT:  The aorta measured 4.1 cm in August 2021.  I will follow this up with CT in the future.  Current medicines are reviewed at length with the patient today.  The patient does not have concerns regarding medicines.  The following changes have been made: None  Labs/ tests ordered today include: As above  Orders Placed This Encounter  Procedures   Ambulatory referral to Genetics   LONG TERM MONITOR (3-14 DAYS)   EKG 12-Lead      Disposition:   FU with in 12 months.     Signed, Rollene Rotunda, MD  01/04/2022 10:05 AM    [facility]

## 2022-01-04 ENCOUNTER — Ambulatory Visit (INDEPENDENT_AMBULATORY_CARE_PROVIDER_SITE_OTHER): Payer: Self-pay

## 2022-01-04 ENCOUNTER — Ambulatory Visit (INDEPENDENT_AMBULATORY_CARE_PROVIDER_SITE_OTHER): Payer: Self-pay | Admitting: Cardiology

## 2022-01-04 ENCOUNTER — Telehealth: Payer: Self-pay | Admitting: Cardiology

## 2022-01-04 ENCOUNTER — Encounter: Payer: Self-pay | Admitting: Cardiology

## 2022-01-04 ENCOUNTER — Other Ambulatory Visit: Payer: Self-pay

## 2022-01-04 VITALS — BP 137/88 | HR 65 | Ht 70.5 in | Wt 236.6 lb

## 2022-01-04 DIAGNOSIS — I422 Other hypertrophic cardiomyopathy: Secondary | ICD-10-CM

## 2022-01-04 DIAGNOSIS — R002 Palpitations: Secondary | ICD-10-CM

## 2022-01-04 DIAGNOSIS — R072 Precordial pain: Secondary | ICD-10-CM

## 2022-01-04 DIAGNOSIS — I7789 Other specified disorders of arteries and arterioles: Secondary | ICD-10-CM

## 2022-01-04 DIAGNOSIS — I10 Essential (primary) hypertension: Secondary | ICD-10-CM

## 2022-01-04 NOTE — Progress Notes (Unsigned)
Enrolled patient for a 3 day Zio XT monitor to be mailed to patient home  [address]  [place] [address]

## 2022-01-04 NOTE — Telephone Encounter (Signed)
Monitor mailed to below address

## 2022-01-04 NOTE — Telephone Encounter (Signed)
Patient called and mentioned that he would like to get his heart monitor sent to new address:  Craig Prince [address] [place], [place] [address]

## 2022-01-04 NOTE — Patient Instructions (Signed)
Medication Instructions:  Your physician recommends that you continue on your current medications as directed. Please refer to the Current Medication list given to you today.  *If you need a refill on your cardiac medications before your next appointment, please call your pharmacy*   Testing/Procedures: ZIO XT- Long Term Monitor Instructions  Your physician has requested you wear a ZIO patch monitor for 3 days.  This is a single patch monitor. [employer] supplies one patch monitor per enrollment. Additional stickers are not available. Please do not apply patch if you will be having a Nuclear Stress Test,  Echocardiogram, Cardiac CT, MRI, or Chest Xray during the period you would be wearing the  monitor. The patch cannot be worn during these tests. You cannot remove and re-apply the  ZIO XT patch monitor.  Your ZIO patch monitor will be mailed 3 day USPS to your address on file. It may take 3-5 days  to receive your monitor after you have been enrolled.  Once you have received your monitor, please review the enclosed instructions. Your monitor  has already been registered assigning a specific monitor serial # to you.  Billing and Patient Assistance Program Information  We have supplied [employer] with any of your insurance information on file for billing purposes. [employer] offers a sliding scale Patient Assistance Program for patients that do not have  insurance, or whose insurance does not completely cover the cost of the ZIO monitor.  You must apply for the Patient Assistance Program to qualify for this discounted rate.  To apply, please call [employer] at [phone], select option 4, select option 2, ask to apply for  Patient Assistance Program. Meredeth Ide will ask your household income, and how many people  are in your household. They will quote your out-of-pocket cost based on that information.  [employer] will also be able to set up a 71-month, interest-free payment plan if needed.  Applying  the monitor   Shave hair from upper left chest.  Hold abrader disc by orange tab. Rub abrader in 40 strokes over the upper left chest as  indicated in your monitor instructions.  Clean area with 4 enclosed alcohol pads. Let dry.  Apply patch as indicated in monitor instructions. Patch will be placed under collarbone on left  side of chest with arrow pointing upward.  Rub patch adhesive wings for 2 minutes. Remove white label marked "1". Remove the white  label marked "2". Rub patch adhesive wings for 2 additional minutes.  While looking in a mirror, press and release button in center of patch. A small green light will  flash 3-4 times. This will be your only indicator that the monitor has been turned on.  Do not shower for the first 24 hours. You may shower after the first 24 hours.  Press the button if you feel a symptom. You will hear a small click. Record Date, Time and  Symptom in the Patient Logbook.  When you are ready to remove the patch, follow instructions on the last 2 pages of Patient  Logbook. Stick patch monitor onto the last page of Patient Logbook.  Place Patient Logbook in the blue and white box. Use locking tab on box and tape box closed  securely. The blue and white box has prepaid postage on it. Please place it in the mailbox as  soon as possible. Your physician should have your test results approximately 7 days after the  monitor has been mailed back to [facility].  Call [facility]  at [phone] if you have questions regarding  your ZIO XT patch monitor. Call them immediately if you see an orange light blinking on your  monitor.  If your monitor falls off in less than 4 days, contact our Monitor department at [phone].  If your monitor becomes loose or falls off after 4 days call [employer] at [phone] for  suggestions on securing your monitor    Follow-Up: At [facility], you and your health needs are our priority.  As part of our  continuing mission to provide you with exceptional heart care, we have created designated Provider Care Teams.  These Care Teams include your primary Cardiologist (physician) and Advanced Practice Providers (APPs -  Physician Assistants and Nurse Practitioners) who all work together to provide you with the care you need, when you need it.  We recommend signing up for the patient portal called "MyChart".  Sign up information is provided on this After Visit Summary.  MyChart is used to connect with patients for Virtual Visits (Telemedicine).  Patients are able to view lab/test results, encounter notes, upcoming appointments, etc.  Non-urgent messages can be sent to your provider as well.   To learn more about what you can do with MyChart, go to [email or web address].    Your next appointment:   3 month(s)  The format for your next appointment:   In Person  Provider:   Rollene Rotunda, MD

## 2022-01-07 DIAGNOSIS — I422 Other hypertrophic cardiomyopathy: Secondary | ICD-10-CM

## 2022-01-07 DIAGNOSIS — R002 Palpitations: Secondary | ICD-10-CM

## 2022-01-28 ENCOUNTER — Telehealth: Payer: Self-pay | Admitting: *Deleted

## 2022-01-28 DIAGNOSIS — I422 Other hypertrophic cardiomyopathy: Secondary | ICD-10-CM

## 2022-01-28 NOTE — Telephone Encounter (Signed)
-----   Message from Minus Breeding, MD sent at 01/25/2022  6:45 PM EST ----- ?Ventricular ectopy was limited with only one run of 18 beats which by current criteria would not count as high risk for sudden cardiac death by itself.  He does have a reduced EF, LGE that is significant and the wall is enlarged at 25 mm .  I would like for him to have a follow up MRI to see if there has been any significant change compared to the study in 2019.  I don't know that he would consent to primary prevention ICD if he has any significant change but we can talk about this after the MRI.  Call Mr. Birtcher with the results and send results to Sueanne Margarita, DO.  Please schedule MRI for evaluation of HOCM.  ?

## 2022-01-28 NOTE — Telephone Encounter (Signed)
Spoke with pt, aware of results and recommendations ?Order placed and Lab orders mailed to the pt  ?

## 2022-02-08 ENCOUNTER — Encounter: Payer: Self-pay | Admitting: *Deleted

## 2022-02-15 ENCOUNTER — Telehealth: Payer: Self-pay | Admitting: Cardiology

## 2022-02-15 NOTE — Telephone Encounter (Signed)
Called pt to verify address on file due to getting some return mail, got pt on the phone and was able to update address.  ?

## 2022-03-02 ENCOUNTER — Ambulatory Visit: Payer: 59 | Admitting: Genetic Counselor

## 2022-03-03 ENCOUNTER — Other Ambulatory Visit: Payer: Self-pay | Admitting: *Deleted

## 2022-03-03 DIAGNOSIS — I422 Other hypertrophic cardiomyopathy: Secondary | ICD-10-CM

## 2022-03-04 ENCOUNTER — Other Ambulatory Visit: Payer: Self-pay | Admitting: *Deleted

## 2022-03-04 DIAGNOSIS — I422 Other hypertrophic cardiomyopathy: Secondary | ICD-10-CM

## 2022-03-04 NOTE — Progress Notes (Cosign Needed)
Pre Test GC ? ?Referring Provider: Rollene Rotunda, MD ? ?Referral Reason  ?Craig Prince was referred for genetic consult and testing of hypertrophic cardiomyopathy. ? ?Personal Medical Information ?Craig Prince (III.9 on pedigree), a 66 y.o. African American gentleman who uses a cane due to back and knee issues is here today for the HCM genetic consult. ?  ?He tells me that he was referred to a cardiologist in his 19s after he was seen at the ER for breathing issues. He tells me that in his 72s, he was finding it very difficult to breathe especially while lying down and as this progressively worsened, he sought medical care. He underwent cardiac imaging studies that detected cardiac wall thickness suggestive of HCM. A recent cMRI detected IVS thickening of 2 cm wit extensive scar burden and LVEF of 50%. ? ?Currently, he reports having dyspnea upon exertion, episodic heart palpitations, dizziness and syncope. He tells me that he blacked out at home once while trying to get up from his chair, around age [age], but did not seek medical care for this episode.  ? ?Traditional Risk Factors ?Craig Prince reports being diagnosed with hypertension around age 54 and tells me that initially his HTN was not well-controlled. However, since he began seeing Dr. Antoine Poche, his HTN has been well managed. ? ?Family history ?Adair (III.9) has seven children- four sons, ages 97, 21, 68 and 7 and three daughters, ages 70, 9 and 11 (IV.23-IV.29). His children and grandchildren (V.1-V.11) do not have cardiac-related issues and are in good health. However, none of his children have been screened for HCM by Echo and EKG. ? ?Craig Prince is one of 13 children (III.1-III8, III.10-III.13). All his siblings, except a sister, age 89 (III.3) do not have heart related issues. He notes that his 30 y.o. sister has heart disease but is not aware of the details relating to her condition other than the she had heart surgery 2 months ago. He does report sudden death in a  brother at age 44 (III.4). Upon not hearing from their brother, his sisters went to check on him and found him dead at home. He says that his brother was in great health. An autopsy was not performed to confirm the cause of death.  ? ?He does note renal disease in his family, with ESRD in his father (II.5), older brother (III.2) who has now had two kidney transplants and a nephew, age 61 (IV.17) who is now on dialysis.  ? ?Craig Prince's father (II.5) with ESRD did not have heart issues. He died at 34 from pneumonia. Craig Prince is not aware of the medical status and cause of death in his other paternal relatives (II.1-II.3, I.1, I.2). Craig Prince's mother (II.6) died 2-3 months after suffering a heart attack. He knows that his maternal aunt and uncles (II.7-II.10) lived to their 40s but does not have any other details. Maternal grandfather (I.3) lived to 78 and grandmother (I.4( died at 22 from complications of diabetes. ? ?Genetics ?Craig Prince was counseled on the genetics of hypertrophic cardiomyopathy (HCM). I explained that this is an autosomal dominant condition and hence his sons and daughters have a 50% chance of inheriting HCM. His first degree-relatives, namely children and siblings should seek regular surveillance for HCM.  Clinical screening involves echocardiogram and EKG at regular intervals, frequency is typically determined by age, with children in their teens being screened every 15-18 months and those over the age of 36 getting screened every 3-5 years. He verbalized understanding of this.  ? ?I discussed penetrance of  HCM being incomplete i.e., not all individuals harboring a HCM mutation will present clinically with HCM, and age-related penetrance where clinical presentation of HCM increases with advanced age. There is no family history of HCM but he reports sudden death in a first-degree relative. Since none of his siblings have been screened for HCM, it is unclear if they are asymptomatic or do not have cardiac wall  thickness indicative of HCM. Thus it likely that he may have inherited this condition or has a  de novo mutation for HCM. ?  ?I also reviewed variable expression of HCM and emphasized that this condition can express at any age at any level of severity in the family. Hence, it is important for his first-degree relatives to stay vigilant and seek regular surveillance for HCM. He verbalized understanding of this. I informed him that some patients - about 8-10% can have compound and digenic mutations for HCM. Also briefly discussed the inheritance pattern and treatment /management plans for the infiltrative cardiomyopathies that present as HCM phenocopies.  ? ?We walked through the process of genetic testing.  ? ?I explained to him that genetic testing is a probabilistic test dependent upon his age and severity of presentation, presence of risk factors for HCM and importantly family history of HCM or sudden death in first-degree relatives. ? ?The potential outcomes of genetic testing and subsequent management of at-risk family members were discussed to manage expectations- ? ?I explained to him that if a mutation is not identified, then all first-degree relatives should undergo regular screening for HCM. I emphasized that even if the genetic test is negative, it does not mean that he does not have HCM. A negative test result can be due to limitations of the genetic test. He verbalized understanding of this. ? ?There is also the likelihood of identifying a ?Variant of unknown significance.? This result means that the variant has not been detected in a statistically significant number of HCM patients and/or functional studies have not been performed to verify its pathogenicity. This VUS can be tested in the family to see if it segregates with disease. If a VUS is found, first-degree relatives should undergo regular clinical screening for HCM. ? ?If a pathogenic variant is reported, then his first-degree family members can  get tested for this variant. If they test positive, it is likely they will develop HCM. Considering variable expression and incomplete penetrance associated with HCM, it is not possible to predict when they will manifest clinically with HCM. It is recommended that family members that test positive for the familial pathogenic variant pursue clinical screening for HCM. Family members that test negative for the familial mutation need not pursue periodic screening for HCM, but seek care if symptoms develop. ?  ?Impression  ?Craig Prince was found to have cardiac wall thickness indicative of HCM in his 65s in the absence of risk factors for HCM. While there is no family history of HCM, he does report sudden death in a first-degree relative. Genetic testing for HCM is recommended to stratify risk of HCM in his family. This test should include the major genes for HCM as well as the HCM phenocopies of Fabry disease, Danon disease, Wolf-Parkinson White syndrome and FTA. Genetic testing will confirm if he has a sarcomeric gene mutation or a HCM phenocopy. ? ?In addition, we discussed the protections afforded by the Genetic Information Non-Discrimination Act (GINA). I explained to him that GINA protects a patient from losing their employment or health insurance based on their genotype.  However, these protections do not cover life insurance and disability. He verbalized understanding of this and is not sure of his children have life insurance. ? ?Please note that the patient has not been counseled in this visit on personal, cultural, or ethical issues that he may face due to his heart condition.  ? ?Plan ?Craig Prince is interested in pursuing genetic testing for HCM. Since his insurance plan is with a relatively new organization, I suspect that it may take a while to obtain prior authorization for his test.  He understands this and we walked through the informed consent for genetic testing. Blood was drawn today for his test. ? ?Lattie Corns, Ph.D, [facility] ?Clinical Molecular Geneticist ?

## 2022-03-09 ENCOUNTER — Telehealth (HOSPITAL_COMMUNITY): Payer: Self-pay | Admitting: *Deleted

## 2022-03-09 NOTE — Telephone Encounter (Signed)
Reaching out to patient to offer assistance regarding upcoming cardiac imaging study; pt verbalizes understanding of appt date/time, parking situation and where to check in, and verified current allergies; name and call back number provided for further questions should they arise  Jonatha Gagen RN Navigator Cardiac Imaging [facility] [phone] office [phone] cell  Patient denies claustrophobia or metal. 

## 2022-03-10 ENCOUNTER — Ambulatory Visit (HOSPITAL_COMMUNITY)
Admission: RE | Admit: 2022-03-10 | Discharge: 2022-03-10 | Disposition: A | Discharge status: Home / Self Care | Payer: 59 | Source: Ambulatory Visit | Attending: Cardiology | Admitting: Cardiology

## 2022-03-10 DIAGNOSIS — I422 Other hypertrophic cardiomyopathy: Secondary | ICD-10-CM | POA: Insufficient documentation

## 2022-03-10 MED ORDER — GADOBUTROL 1 MMOL/ML IV SOLN
13.0000 mL | Freq: Once | INTRAVENOUS | Status: AC | PRN
  Administered 2022-03-10: 13 mL via INTRAVENOUS

## 2022-03-30 NOTE — Progress Notes (Signed)
?  ?Cardiology Office Note ? ? ?Date:  04/01/2022  ? ?ID:  Craig Prince, DOB 1956/07/24, MRN [id number] ? ?PCP:  Charlane Ferretti, DO  ?Cardiologist:   None ? ? ?Chief Complaint  ?Patient presents with  ? Cardiomyopathy  ? ? ? ?  ?History of Present Illness: ?Craig Prince is a 66 y.o. male who presents for followup of his hypertension and cardiomyopathy. He has a nonischemic cardiomyopathy with an original ejection fraction of 25% to 50% on followup in 2012 and 14.  I last saw him in  2019.  He has had a CT coronary angiogram with normal coronaries and zero calcium score.    Cardiac MRI demonstrated extensive mid wall gadolinium enhancement with septal wall thickening all supporting the diagnosis of hypertrophic cardiomyopathy.  An echocardiogram suggested that his EF was about 50%.  There was some septal anterior motion of the mitral valve.  However, there was no significant gradient.  He was seen [facility] at [facility].  I was able to see an echocardiogram.  He had a 42 mm aorta.  It looks like he had 20 mm septal thickening with an EF of 65 to 70%.  They describe severe septal hypertrophy but no significant gradient or SAM. ? ?Since I last saw him he had an MRI and had some features that are high risk with increased LGE, NSVT on monitor and 25 mm wall thickness.    He did have his genetic testing with results pending.  He talked with Dr. Jomarie Longs.  In addition he did have an 18 beat run of VT on the monitor.   ? ?He returns today and he says he feels well. The patient denies any new symptoms such as chest discomfort, neck or arm discomfort. There has been no new shortness of breath, PND or orthopnea. There have been no reported palpitations, presyncope or syncope.  ? ? ?Past Medical History:  ?Diagnosis Date  ? Arthritis   ? CAD (coronary artery disease)   ? NONOBSTRUCTIVE  a. cath 8/10: prox to mid LAD 20%  ? Chronic systolic heart failure (HCC)   ? Essential hypertension, benign   ? Family history of breast  cancer   ? Family history of prostate cancer   ? Gout   ? Gout   ? Hypertrophic nonobstructive cardiomyopathy (HCC)   ? septal hypertrophy without LVOT  ? NICM (nonischemic cardiomyopathy) (HCC)   ? a. echo 8/10: EF20-25%, mod LAE, severe asymmetric septal hypertrophy (? nonobs. HCM); grade 1 diast dyfxn.  EF 55% echo June 6  ? ? ?Past Surgical History:  ?Procedure Laterality Date  ? KNEE ARTHROSCOPY    ? right  ? ? ? ?Current Outpatient Medications  ?Medication Sig Dispense Refill  ? acetaminophen (TYLENOL) 325 MG tablet Take 2 tablets (650 mg total) by mouth every 4 (four) hours as needed for headache or mild pain.    ? allopurinol (ZYLOPRIM) 100 MG tablet Take 100 mg by mouth daily.    ? amLODipine (NORVASC) 10 MG tablet Take 1 tablet (10 mg total) by mouth daily. 90 tablet 3  ? carvedilol (COREG) 25 MG tablet TAKE 2 TABLETS BY MOUTH TWICE DAILY WITH MEALS 120 tablet 3  ? Cholecalciferol (VITAMIN D-3 PO) Take 1 tablet by mouth daily.    ? CINNAMON PO Take 1 tablet by mouth daily.    ? enalapril (VASOTEC) 20 MG tablet TAKE 1 TABLET(20 MG) BY MOUTH DAILY 90 tablet 3  ? furosemide (LASIX) 40 MG  tablet Take 1 tablet (40 mg total) by mouth daily. 90 tablet 3  ? Garlic TABS Take 1 tablet by mouth daily.    ? ondansetron (ZOFRAN ODT) 4 MG disintegrating tablet Take 1 tablet (4 mg total) by mouth every 8 (eight) hours as needed for nausea or vomiting. 10 tablet 0  ? oxyCODONE (OXY IR/ROXICODONE) 5 MG immediate release tablet Take 0.5-1 tablets (2.5-5 mg total) by mouth every 6 (six) hours as needed for severe pain. 5 tablet 0  ? rosuvastatin (CRESTOR) 10 MG tablet Take 10 mg by mouth at bedtime.    ? meloxicam (MOBIC) 7.5 MG tablet Take 7.5 mg by mouth daily as needed.    ? ?No current facility-administered medications for this visit.  ? ? ?Allergies:   Patient has no known allergies.  ? ? ?ROS:  Please see the history of present illness.   Otherwise, review of systems are positive for back and knee problems.   All other  systems are reviewed and negative.  ? ? ?PHYSICAL EXAM: ?VS:  BP 112/78   Pulse 64   Ht 5\' 11"  (1.803 m)   Wt 228 lb 9.6 oz (103.7 kg)   SpO2 98%   BMI 31.88 kg/m?  , BMI Body mass index is 31.88 kg/m?. ?GENERAL:  Well appearing ?NECK:  No jugular venous distention, waveform within normal limits, carotid upstroke brisk and symmetric, no bruits, no thyromegaly ?LUNGS:  Clear to auscultation bilaterally ?CHEST:  Unremarkable ?HEART:  PMI not displaced or sustained,S1 and S2 within normal limits, no S3, no S4, no clicks, no rubs, no murmurs ?ABD:  Flat, positive bowel sounds normal in frequency in pitch, no bruits, no rebound, no guarding, no midline pulsatile mass, no hepatomegaly, no splenomegaly ?EXT:  2 plus pulses throughout, no edema, no cyanosis no clubbing ? ? ?EKG:  EKG is not ordered today. ? ? ? ?Recent Labs: ?No results found for requested labs within last 8760 hours.  ? ? ?Lipid Panel ?   ?Component Value Date/Time  ? CHOL  07/27/2009 0500  ?  193        ?ATP III CLASSIFICATION: ? <200     mg/dL   Desirable ? 07/29/2009  mg/dL   Borderline High ? 734-193    mg/dL   High ?        ? TRIG 77 07/27/2009 0500  ? HDL 35 (L) 07/27/2009 0500  ? CHOLHDL 5.5 07/27/2009 0500  ? VLDL 15 07/27/2009 0500  ? LDLCALC (H) 07/27/2009 0500  ?  143        ?Total Cholesterol/HDL:CHD Risk ?Coronary Heart Disease Risk Table ?                    Men   Women ? 1/2 Average Risk   3.4   3.3 ? Average Risk       5.0   4.4 ? 2 X Average Risk   9.6   7.1 ? 3 X Average Risk  23.4   11.0 ?       ?Use the calculated Patient Ratio ?above and the CHD Risk Table ?to determine the patient's CHD Risk. ?       ?ATP III CLASSIFICATION (LDL): ? <100     mg/dL   Optimal ? 07/29/2009  mg/dL   Near or Above ?                   Optimal ? 130-159  mg/dL   Borderline ?  160-189  mg/dL   High ? >540     mg/dL   Very High  ? ?  ? ?Wt Readings from Last 3 Encounters:  ?04/01/22 228 lb 9.6 oz (103.7 kg)  ?01/04/22 236 lb 9.6 oz (107.3 kg)  ?07/13/21 228 lb 9.6  oz (103.7 kg)  ?  ? ? ?Other studies Reviewed: ?Additional studies/ records that were reviewed today include: MRI, genetic consultation note ?Review of the above records demonstrates:  Please see elsewhere in the note.   ? ? ?ASSESSMENT AND PLAN: ? ?HCM:   We have again had another long discussion about the physiology.  Several times was not checked on his genetic testing results which are still pending.  I think he needs to be considered for primary prevention ICD.    As I review his genetic testing aside from a borderline septal thickening, Holter results and the increased gadolinium he also has a first-degree relative with sudden death.  Circumstances around that are unclear but suspicious.  I did again talk to him about with the lower screening of his kids.   ? ?HTN:  His blood pressure is controlled.  No change in therapy. ? ?AO ENLARGEMENT:  The aorta measured 4.2 cm on MRI.  I will follow this with repeat imaging in the future.   ? ?Current medicines are reviewed at length with the patient today.  The patient does not have concerns regarding medicines. ? ?The following changes have been made: None ? ?Labs/ tests ordered today include:  None  ? ?Orders Placed This Encounter  ?Procedures  ? Ambulatory referral to Cardiac Electrophysiology  ? ? ? ? ?Disposition:   FU with in 3 months.    I will set him up to see Dr. Graciela Husbands. ? ? ?Signed, ?Rollene Rotunda, MD  ?04/01/2022 8:50 AM    ?[facility] ? ? ? ?

## 2022-04-01 ENCOUNTER — Ambulatory Visit (INDEPENDENT_AMBULATORY_CARE_PROVIDER_SITE_OTHER): Payer: 59 | Admitting: Cardiology

## 2022-04-01 ENCOUNTER — Encounter: Payer: Self-pay | Admitting: Cardiology

## 2022-04-01 VITALS — BP 112/78 | HR 64 | Ht 71.0 in | Wt 228.6 lb

## 2022-04-01 DIAGNOSIS — I7789 Other specified disorders of arteries and arterioles: Secondary | ICD-10-CM

## 2022-04-01 DIAGNOSIS — I422 Other hypertrophic cardiomyopathy: Secondary | ICD-10-CM | POA: Diagnosis not present

## 2022-04-01 DIAGNOSIS — I10 Essential (primary) hypertension: Secondary | ICD-10-CM | POA: Diagnosis not present

## 2022-04-01 NOTE — Patient Instructions (Signed)
Medication Instructions:  ?Continue same medications ?*If you need a refill on your cardiac medications before your next appointment, please call your pharmacy* ? ? ?Lab Work: ?None ordered ? ? ?Testing/Procedures: ?None ordered ? ? ?Follow-Up: ?At [facility], you and your health needs are our priority.  As part of our continuing mission to provide you with exceptional heart care, we have created designated Provider Care Teams.  These Care Teams include your primary Cardiologist (physician) and Advanced Practice Providers (APPs -  Physician Assistants and Nurse Practitioners) who all work together to provide you with the care you need, when you need it. ? ?We recommend signing up for the patient portal called "MyChart".  Sign up information is provided on this After Visit Summary.  MyChart is used to connect with patients for Virtual Visits (Telemedicine).  Patients are able to view lab/test results, encounter notes, upcoming appointments, etc.  Non-urgent messages can be sent to your provider as well.   ?To learn more about what you can do with MyChart, go to [email or web address].   ? ?Your next appointment: 3 months  ?  ? ?The format for your next appointment: Office ? ? ?Provider:  Dr.Hochrein ?   :1}  ? ? ?Dr.Klein's office will call with appointment ? ?Important Information About Sugar ? ? ? ? ? ? ?

## 2022-04-11 ENCOUNTER — Emergency Department (HOSPITAL_COMMUNITY): Payer: 59

## 2022-04-11 ENCOUNTER — Other Ambulatory Visit: Payer: Self-pay

## 2022-04-11 ENCOUNTER — Encounter (HOSPITAL_COMMUNITY): Payer: Self-pay

## 2022-04-11 ENCOUNTER — Emergency Department (HOSPITAL_COMMUNITY)
Admission: EM | Admit: 2022-04-11 | Discharge: 2022-04-11 | Disposition: A | Discharge status: Home / Self Care | Payer: 59 | Attending: Emergency Medicine | Admitting: Emergency Medicine

## 2022-04-11 DIAGNOSIS — Y92410 Unspecified street and highway as the place of occurrence of the external cause: Secondary | ICD-10-CM | POA: Diagnosis not present

## 2022-04-11 DIAGNOSIS — S32018A Other fracture of first lumbar vertebra, initial encounter for closed fracture: Secondary | ICD-10-CM | POA: Insufficient documentation

## 2022-04-11 DIAGNOSIS — R1011 Right upper quadrant pain: Secondary | ICD-10-CM | POA: Insufficient documentation

## 2022-04-11 DIAGNOSIS — S32038A Other fracture of third lumbar vertebra, initial encounter for closed fracture: Secondary | ICD-10-CM | POA: Diagnosis not present

## 2022-04-11 DIAGNOSIS — M542 Cervicalgia: Secondary | ICD-10-CM | POA: Insufficient documentation

## 2022-04-11 DIAGNOSIS — Z79899 Other long term (current) drug therapy: Secondary | ICD-10-CM | POA: Diagnosis not present

## 2022-04-11 DIAGNOSIS — M25512 Pain in left shoulder: Secondary | ICD-10-CM | POA: Insufficient documentation

## 2022-04-11 DIAGNOSIS — I2510 Atherosclerotic heart disease of native coronary artery without angina pectoris: Secondary | ICD-10-CM | POA: Insufficient documentation

## 2022-04-11 DIAGNOSIS — S3992XA Unspecified injury of lower back, initial encounter: Secondary | ICD-10-CM | POA: Diagnosis present

## 2022-04-11 DIAGNOSIS — Z7901 Long term (current) use of anticoagulants: Secondary | ICD-10-CM | POA: Insufficient documentation

## 2022-04-11 DIAGNOSIS — R519 Headache, unspecified: Secondary | ICD-10-CM | POA: Diagnosis not present

## 2022-04-11 DIAGNOSIS — S32048A Other fracture of fourth lumbar vertebra, initial encounter for closed fracture: Secondary | ICD-10-CM | POA: Insufficient documentation

## 2022-04-11 DIAGNOSIS — S32028A Other fracture of second lumbar vertebra, initial encounter for closed fracture: Secondary | ICD-10-CM | POA: Insufficient documentation

## 2022-04-11 DIAGNOSIS — R0781 Pleurodynia: Secondary | ICD-10-CM | POA: Insufficient documentation

## 2022-04-11 DIAGNOSIS — D72829 Elevated white blood cell count, unspecified: Secondary | ICD-10-CM | POA: Insufficient documentation

## 2022-04-11 DIAGNOSIS — I10 Essential (primary) hypertension: Secondary | ICD-10-CM | POA: Insufficient documentation

## 2022-04-11 LAB — I-STAT CHEM 8, ED
BUN: 26 mg/dL — ABNORMAL HIGH (ref 8–23)
Calcium, Ion: 1.07 mmol/L — ABNORMAL LOW (ref 1.15–1.40)
Chloride: 104 mmol/L (ref 98–111)
Creatinine, Ser: 1.1 mg/dL (ref 0.61–1.24)
Glucose, Bld: 114 mg/dL — ABNORMAL HIGH (ref 70–99)
HCT: 44 % (ref 39.0–52.0)
Hemoglobin: 15 g/dL (ref 13.0–17.0)
Potassium: 4.4 mmol/L (ref 3.5–5.1)
Sodium: 137 mmol/L (ref 135–145)
TCO2: 25 mmol/L (ref 22–32)

## 2022-04-11 LAB — COMPREHENSIVE METABOLIC PANEL
ALT: 38 U/L (ref 0–44)
AST: 56 U/L — ABNORMAL HIGH (ref 15–41)
Albumin: 4 g/dL (ref 3.5–5.0)
Alkaline Phosphatase: 41 U/L (ref 38–126)
Anion gap: 11 (ref 5–15)
BUN: 19 mg/dL (ref 8–23)
CO2: 21 mmol/L — ABNORMAL LOW (ref 22–32)
Calcium: 9.1 mg/dL (ref 8.9–10.3)
Chloride: 105 mmol/L (ref 98–111)
Creatinine, Ser: 1.02 mg/dL (ref 0.61–1.24)
GFR, Estimated: >60 mL/min (ref >60)
Glucose, Bld: 112 mg/dL — ABNORMAL HIGH (ref 70–99)
Potassium: 4.5 mmol/L (ref 3.5–5.1)
Sodium: 137 mmol/L (ref 135–145)
Total Bilirubin: 0.9 mg/dL (ref 0.3–1.2)
Total Protein: 7 g/dL (ref 6.5–8.1)

## 2022-04-11 LAB — PROTIME-INR
INR: 1 (ref 0.8–1.2)
Prothrombin Time: 13.5 seconds (ref 11.4–15.2)

## 2022-04-11 LAB — LACTIC ACID, PLASMA: Lactic Acid, Venous: 1.8 mmol/L (ref 0.5–1.9)

## 2022-04-11 LAB — CBC
HCT: 41.2 % (ref 39.0–52.0)
Hemoglobin: 13.1 g/dL (ref 13.0–17.0)
MCH: 22.6 pg — ABNORMAL LOW (ref 26.0–34.0)
MCHC: 31.8 g/dL (ref 30.0–36.0)
MCV: 71 fL — ABNORMAL LOW (ref 80.0–100.0)
Platelets: 190 10*3/uL (ref 150–400)
RBC: 5.8 MIL/uL (ref 4.22–5.81)
RDW: 17.9 % — ABNORMAL HIGH (ref 11.5–15.5)
WBC: 13.5 10*3/uL — ABNORMAL HIGH (ref 4.0–10.5)
nRBC: 0 % (ref 0.0–0.2)

## 2022-04-11 LAB — SAMPLE TO BLOOD BANK

## 2022-04-11 LAB — ETHANOL: Alcohol, Ethyl (B): <10 mg/dL (ref <10)

## 2022-04-11 LAB — MAGNESIUM: Magnesium: 2 mg/dL (ref 1.7–2.4)

## 2022-04-11 MED ORDER — ONDANSETRON 4 MG PO TBDP: 4.0000 mg | ORAL_TABLET | Freq: Three times a day (TID) | ORAL | 0 refills | Status: DC | PRN

## 2022-04-11 MED ORDER — OXYCODONE-ACETAMINOPHEN 5-325 MG PO TABS: 1.0000 | ORAL_TABLET | Freq: Four times a day (QID) | ORAL | 0 refills | Status: DC | PRN

## 2022-04-11 MED ORDER — OXYCODONE-ACETAMINOPHEN 5-325 MG PO TABS
1.0000 | ORAL_TABLET | Freq: Once | ORAL | Status: AC
  Administered 2022-04-11: 1 via ORAL
  Filled 2022-04-11: qty 1

## 2022-04-11 MED ORDER — ONDANSETRON 4 MG PO TBDP
4.0000 mg | ORAL_TABLET | Freq: Once | ORAL | Status: AC
  Administered 2022-04-11: 4 mg via ORAL
  Filled 2022-04-11: qty 1

## 2022-04-11 MED ORDER — KETOROLAC TROMETHAMINE 15 MG/ML IJ SOLN
15.0000 mg | Freq: Once | INTRAMUSCULAR | Status: AC
Start: 2022-04-11 — End: 2022-04-11
  Administered 2022-04-11: 15 mg via INTRAVENOUS
  Filled 2022-04-11: qty 1

## 2022-04-11 MED ORDER — FENTANYL CITRATE PF 50 MCG/ML IJ SOSY: 100.0000 ug | PREFILLED_SYRINGE | INTRAMUSCULAR | Status: DC | PRN

## 2022-04-11 MED ORDER — IOHEXOL 300 MG/ML  SOLN
100.0000 mL | Freq: Once | INTRAMUSCULAR | Status: AC | PRN
  Administered 2022-04-11: 100 mL via INTRAVENOUS

## 2022-04-11 MED ORDER — FENTANYL CITRATE PF 50 MCG/ML IJ SOSY
100.0000 ug | PREFILLED_SYRINGE | Freq: Once | INTRAMUSCULAR | Status: AC
  Administered 2022-04-11: 100 ug via INTRAVENOUS
  Filled 2022-04-11: qty 2
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
## 2022-04-11 NOTE — Discharge Instructions (Signed)
Take 650 mg of Tylenol every 6 hours.  Take 600 mg of ibuprofen every 6 hours.  If you have persistent pain despite this, there is a prescription for a narcotic pain medicine that was sent to your pharmacy.  Take this as needed.  There was also medication sent for nausea to be taken as needed.  Continue movement within the limits of your pain tolerance.  You will likely have increased soreness tomorrow.  After that, your symptoms should improve.  Return to the emergency department at any time if you do experience any new or worsening symptoms of concern. ?

## 2022-04-22 ENCOUNTER — Other Ambulatory Visit: Payer: Self-pay

## 2022-04-22 ENCOUNTER — Encounter (HOSPITAL_BASED_OUTPATIENT_CLINIC_OR_DEPARTMENT_OTHER): Payer: Self-pay | Admitting: Emergency Medicine

## 2022-04-22 ENCOUNTER — Emergency Department (HOSPITAL_BASED_OUTPATIENT_CLINIC_OR_DEPARTMENT_OTHER)
Admission: EM | Admit: 2022-04-22 | Discharge: 2022-04-22 | Disposition: A | Discharge status: Home / Self Care | Payer: 59 | Attending: Emergency Medicine | Admitting: Emergency Medicine

## 2022-04-22 ENCOUNTER — Emergency Department (HOSPITAL_BASED_OUTPATIENT_CLINIC_OR_DEPARTMENT_OTHER): Payer: 59

## 2022-04-22 DIAGNOSIS — R0602 Shortness of breath: Secondary | ICD-10-CM | POA: Diagnosis not present

## 2022-04-22 DIAGNOSIS — I110 Hypertensive heart disease with heart failure: Secondary | ICD-10-CM | POA: Diagnosis not present

## 2022-04-22 DIAGNOSIS — R7989 Other specified abnormal findings of blood chemistry: Secondary | ICD-10-CM | POA: Diagnosis not present

## 2022-04-22 DIAGNOSIS — I509 Heart failure, unspecified: Secondary | ICD-10-CM | POA: Diagnosis not present

## 2022-04-22 DIAGNOSIS — R0789 Other chest pain: Secondary | ICD-10-CM | POA: Insufficient documentation

## 2022-04-22 DIAGNOSIS — Z79899 Other long term (current) drug therapy: Secondary | ICD-10-CM | POA: Diagnosis not present

## 2022-04-22 DIAGNOSIS — R051 Acute cough: Secondary | ICD-10-CM | POA: Diagnosis not present

## 2022-04-22 DIAGNOSIS — I2510 Atherosclerotic heart disease of native coronary artery without angina pectoris: Secondary | ICD-10-CM | POA: Diagnosis not present

## 2022-04-22 DIAGNOSIS — R059 Cough, unspecified: Secondary | ICD-10-CM | POA: Diagnosis present

## 2022-04-22 DIAGNOSIS — J3489 Other specified disorders of nose and nasal sinuses: Secondary | ICD-10-CM | POA: Insufficient documentation

## 2022-04-22 DIAGNOSIS — Z7901 Long term (current) use of anticoagulants: Secondary | ICD-10-CM | POA: Insufficient documentation

## 2022-04-22 LAB — CBC WITH DIFFERENTIAL/PLATELET
Abs Immature Granulocytes: 0.03 10*3/uL (ref 0.00–0.07)
Basophils Absolute: 0 10*3/uL (ref 0.0–0.1)
Basophils Relative: 1 %
Eosinophils Absolute: 0.1 10*3/uL (ref 0.0–0.5)
Eosinophils Relative: 2 %
HCT: 38 % — ABNORMAL LOW (ref 39.0–52.0)
Hemoglobin: 12 g/dL — ABNORMAL LOW (ref 13.0–17.0)
Immature Granulocytes: 0 %
Lymphocytes Relative: 11 %
Lymphs Abs: 0.9 10*3/uL (ref 0.7–4.0)
MCH: 22.2 pg — ABNORMAL LOW (ref 26.0–34.0)
MCHC: 31.6 g/dL (ref 30.0–36.0)
MCV: 70.4 fL — ABNORMAL LOW (ref 80.0–100.0)
Monocytes Absolute: 0.6 10*3/uL (ref 0.1–1.0)
Monocytes Relative: 7 %
Neutro Abs: 7.1 10*3/uL (ref 1.7–7.7)
Neutrophils Relative %: 79 %
Platelets: 208 10*3/uL (ref 150–400)
RBC: 5.4 MIL/uL (ref 4.22–5.81)
RDW: 17.7 % — ABNORMAL HIGH (ref 11.5–15.5)
WBC: 8.8 10*3/uL (ref 4.0–10.5)
nRBC: 0 % (ref 0.0–0.2)

## 2022-04-22 LAB — COMPREHENSIVE METABOLIC PANEL
ALT: 22 U/L (ref 0–44)
AST: 25 U/L (ref 15–41)
Albumin: 4 g/dL (ref 3.5–5.0)
Alkaline Phosphatase: 58 U/L (ref 38–126)
Anion gap: 8 (ref 5–15)
BUN: 24 mg/dL — ABNORMAL HIGH (ref 8–23)
CO2: 23 mmol/L (ref 22–32)
Calcium: 9.1 mg/dL (ref 8.9–10.3)
Chloride: 105 mmol/L (ref 98–111)
Creatinine, Ser: 1 mg/dL (ref 0.61–1.24)
GFR, Estimated: >60 mL/min (ref >60)
Glucose, Bld: 98 mg/dL (ref 70–99)
Potassium: 3.7 mmol/L (ref 3.5–5.1)
Sodium: 136 mmol/L (ref 135–145)
Total Bilirubin: 0.8 mg/dL (ref 0.3–1.2)
Total Protein: 7.1 g/dL (ref 6.5–8.1)

## 2022-04-22 LAB — BRAIN NATRIURETIC PEPTIDE: B Natriuretic Peptide: 281.8 pg/mL — ABNORMAL HIGH (ref 0.0–100.0)

## 2022-04-22 LAB — TROPONIN I (HIGH SENSITIVITY): Troponin I (High Sensitivity): 33 ng/L — ABNORMAL HIGH (ref <18)

## 2022-04-22 MED ORDER — IOHEXOL 350 MG/ML SOLN
100.0000 mL | Freq: Once | INTRAVENOUS | Status: AC | PRN
Start: 2022-04-22 — End: 2022-04-22
  Administered 2022-04-22: 100 mL via INTRAVENOUS
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
## 2022-04-22 NOTE — ED Notes (Signed)
Patient transported to CT 
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
## 2022-04-22 NOTE — ED Notes (Signed)
Ambulated on r/a SPO2 95-98%, HR 85, denies SOB/DOE
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
## 2022-04-27 NOTE — Progress Notes (Deleted)
ELECTROPHYSIOLOGY CONSULT NOTE  Patient ID: Craig Prince, MRN: [id number], DOB/AGE: Oct 10, 1956 66 y.o. Admit date: (Not on file) Date of Consult: 04/27/2022  Primary Physician: Craig Margarita, DO Primary Cardiologist: ***     Craig Prince is a 66 y.o. male who is being seen today for the evaluation of *** at the request of ***.    HPI Craig Prince is a 66 y.o. male ***  DATE TEST EF   1/18 CTA  CaScore 0  9/19 cMRI 45% 25/12 mm ]LGE extensive  10/19 Echo   50-55 % ASH  4/23 cMRI  40% 22/4m No SAM--LGE 23%   Date Cr K Hgb  5/23 1.00 3.7 12.0            Past Medical History:  Diagnosis Date   Arthritis    CAD (coronary artery disease)    NONOBSTRUCTIVE  a. cath 8/10: prox to mid LAD 292%  Chronic systolic heart failure (HCC)    Essential hypertension, benign    Family history of breast cancer    Family history of prostate cancer    Gout    Gout    Hypertrophic nonobstructive cardiomyopathy (HCC)    septal hypertrophy without LVOT   [place]    a. echo 8/10: EF20-25%, mod LAE, severe asymmetric septal hypertrophy (? nonobs. HCM); grade 1 diast dyfxn.  EF 55% echo June 6      Surgical History:  Past Surgical History:  Procedure Laterality Date   KNEE ARTHROSCOPY     right     Home Meds: No outpatient medications have been marked as taking for the 04/28/22 encounter (Appointment) with KDeboraha Sprang MD.    Allergies: No Known Allergies  Social History   Socioeconomic History   Marital status: Legally Separated    Spouse name: Not on file   Number of children: Not on file   Years of education: Not on file   Highest education level: Not on file  Occupational History   Not on file  Tobacco Use   Smoking status: Never   Smokeless tobacco: Never  Substance and Sexual Activity   Alcohol use: No    Alcohol/week: 0.0 standard drinks   Drug use: No   Sexual activity: Not on file  Other Topics Concern   Not on  file  Social History Narrative   Not on file   Social Determinants of Health   Financial Resource Strain: Not on file  Food Insecurity: Not on file  Transportation Needs: Not on file  Physical Activity: Not on file  Stress: Not on file  Social Connections: Not on file  Intimate Partner Violence: Not on file     Family History  Problem Relation Age of Onset   Kidney disease Brother    Cancer Brother        prostate   Kidney disease Father    Diabetes Mother    Heart attack Mother 727  Heart disease Mother    Cancer Sister        breast   CAD Brother 746      60% blockage   Breast cancer Sister 461  Breast cancer Sister 643      PALB2 pos   Healthy Son    Healthy Daughter      ROS:  Please see the history of present illness.   {ros [id number]}  All other systems reviewed and negative.  Physical Exam:*** There were no vitals taken for this visit. General: Well developed, well nourished male in no acute distress. Head: Normocephalic, atraumatic, sclera non-icteric, no xanthomas, nares are without discharge. EENT: normal  Lymph Nodes:  none Neck: Negative for carotid bruits. JVD not elevated. Back:without scoliosis kyphosis*** Lungs: Clear bilaterally to auscultation without wheezes, rales, or rhonchi. Breathing is unlabored. Heart: RRR with S1 S2. No *** ***/6 systolic*** murmur . No rubs, or gallops appreciated. Abdomen: Soft, non-tender, non-distended with normoactive bowel sounds. No hepatomegaly. No rebound/guarding. No obvious abdominal masses. Msk:  Strength and tone appear normal for age. Extremities: No clubbing or cyanosis. No*** ***+*** edema.  Distal pedal pulses are 2+ and equal bilaterally. Skin: Warm and Dry Neuro: Alert and oriented X 3. CN III-XII intact Grossly normal sensory and motor function . Psych:  Responds to questions appropriately with a normal affect.        EKG: ***   Assessment and Plan: *** Craig Prince

## 2022-04-28 ENCOUNTER — Institutional Professional Consult (permissible substitution): Payer: 59 | Admitting: Internal Medicine

## 2022-04-28 DIAGNOSIS — I422 Other hypertrophic cardiomyopathy: Secondary | ICD-10-CM

## 2022-05-26 ENCOUNTER — Encounter: Payer: Self-pay | Admitting: Internal Medicine

## 2022-05-26 ENCOUNTER — Ambulatory Visit (INDEPENDENT_AMBULATORY_CARE_PROVIDER_SITE_OTHER): Payer: 59 | Admitting: Internal Medicine

## 2022-05-26 VITALS — BP 128/90 | HR 67 | Ht 71.0 in | Wt 205.4 lb

## 2022-05-26 DIAGNOSIS — I422 Other hypertrophic cardiomyopathy: Secondary | ICD-10-CM
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
## 2022-05-26 NOTE — Patient Instructions (Signed)
Medication Instructions:  Your physician recommends that you continue on your current medications as directed. Please refer to the Current Medication list given to you today.  *If you need a refill on your cardiac medications before your next appointment, please call your pharmacy*   Lab Work: None ordered.  If you have labs (blood work) drawn today and your tests are completely normal, you will receive your results only by: MyChart Message (if you have MyChart) OR A paper copy in the mail If you have any lab test that is abnormal or we need to change your treatment, we will call you to review the results.   Testing/Procedures: None ordered.    Follow-Up: At [facility], you and your health needs are our priority.  As part of our continuing mission to provide you with exceptional heart care, we have created designated Provider Care Teams.  These Care Teams include your primary Cardiologist (physician) and Advanced Practice Providers (APPs -  Physician Assistants and Nurse Practitioners) who all work together to provide you with the care you need, when you need it.  We recommend signing up for the patient portal called "MyChart".  Sign up information is provided on this After Visit Summary.  MyChart is used to connect with patients for Virtual Visits (Telemedicine).  Patients are able to view lab/test results, encounter notes, upcoming appointments, etc.  Non-urgent messages can be sent to your provider as well.   To learn more about what you can do with MyChart, go to [email or web address].    Your next appointment:   07/26/2022 at 1030am with Dr Graciela Husbands  Important Information About Sugar

## 2022-07-08 ENCOUNTER — Ambulatory Visit: Payer: 59 | Admitting: Cardiology

## 2022-07-19 DIAGNOSIS — I4729 Other ventricular tachycardia: Secondary | ICD-10-CM | POA: Insufficient documentation

## 2022-07-19 NOTE — Progress Notes (Unsigned)
Cardiology Office Note   Date:  07/21/2022   ID:  Craig Prince, DOB [birth year], MRN [id number]  PCP:  Charlane Ferretti, DO  Cardiologist:   None   Chief Complaint  Patient presents with   Cardiomyopathy       History of Present Illness: Craig Prince is a 66 y.o. male who presents for followup of his hypertension and cardiomyopathy. He has a nonischemic cardiomyopathy with an original ejection fraction of 25% to 50% on followup in 2012 and 14.  I last saw him in  2019.  He has had a CT coronary angiogram with normal coronaries and zero calcium score.    Cardiac MRI demonstrated extensive mid wall gadolinium enhancement with septal wall thickening all supporting the diagnosis of hypertrophic cardiomyopathy.  An echocardiogram suggested that his EF was about 50%.  There was some septal anterior motion of the mitral valve.  However, there was no significant gradient.  He was seen previously at [facility].  On Echo he had a 42 mm aorta.  It looks like he had 20 mm septal thickening with an EF of 65 to 70%.  They describe severe septal hypertrophy but no significant gradient or SAM.  He had an MRI and had some features that are high risk with increased LGE, NSVT on monitor and 25 mm wall thickness.    He did have his genetic testing with results pending.  He talked with Dr. Jomarie Longs.  In addition he did have an 18 beat run of VT on the monitor.   He saw Dr. Graciela Husbands and he is considering ICD placement.    Of note his ejection fraction on the MRI was 40%.  Since I last saw him he has lost about 40 pounds.  He is dieting.  He is exercising.  He feels better. The patient denies any new symptoms such as chest discomfort, neck or arm discomfort. There has been no new shortness of breath, PND or orthopnea. There have been no reported palpitations, presyncope or syncope.  He has been going to the gym.  He has not been having any cardiovascular symptoms.   Past Medical History:  Diagnosis Date   Arthritis     CAD (coronary artery disease)    NONOBSTRUCTIVE  a. cath 8/10: prox to mid LAD 20%   Chronic systolic heart failure (HCC)    Essential hypertension, benign    Family history of breast cancer    Family history of prostate cancer    Gout    Gout    Hypertrophic nonobstructive cardiomyopathy (HCC)    septal hypertrophy without LVOT   NICM (nonischemic cardiomyopathy) (HCC)    a. echo 8/10: EF20-25%, mod LAE, severe asymmetric septal hypertrophy (? nonobs. HCM); grade 1 diast dyfxn.  EF 55% echo June 6    Past Surgical History:  Procedure Laterality Date   KNEE ARTHROSCOPY     right     Current Outpatient Medications  Medication Sig Dispense Refill   acetaminophen (TYLENOL) 325 MG tablet Take 2 tablets (650 mg total) by mouth every 4 (four) hours as needed for headache or mild pain.     allopurinol (ZYLOPRIM) 100 MG tablet Take 100 mg by mouth daily.     busPIRone (BUSPAR) 5 MG tablet Take 5 mg by mouth 2 (two) times daily.     carvedilol (COREG) 25 MG tablet TAKE 2 TABLETS BY MOUTH TWICE DAILY WITH MEALS 120 tablet 3   Cholecalciferol (VITAMIN D-3 PO) Take 1  tablet by mouth daily.     CINNAMON PO Take 1 tablet by mouth daily.     ferrous sulfate 325 (65 FE) MG tablet 1 tablet Orally Daily for 90 days     furosemide (LASIX) 40 MG tablet Take 1 tablet (40 mg total) by mouth daily. 90 tablet 3   LORazepam (ATIVAN) 0.5 MG tablet Take by mouth.     meloxicam (MOBIC) 7.5 MG tablet Take 7.5 mg by mouth daily as needed.     sacubitril-valsartan (ENTRESTO) 49-51 MG Take 1 tablet by mouth 2 (two) times daily. 60 tablet 5   traZODone (DESYREL) 50 MG tablet Take by mouth.     No current facility-administered medications for this visit.    Allergies:   Patient has no known allergies.    ROS:  Please see the history of present illness.   Otherwise, review of systems are positive for none.   All other systems are reviewed and negative.    PHYSICAL EXAM: VS:  BP 121/81   Pulse 64    Ht 5\' 10"  (1.778 m)   Wt 194 lb 6.4 oz (88.2 kg)   SpO2 99%   BMI 27.89 kg/m  , BMI Body mass index is 27.89 kg/m. GENERAL:  Well appearing NECK:  No jugular venous distention, waveform within normal limits, carotid upstroke brisk and symmetric, no bruits, no thyromegaly LUNGS:  Clear to auscultation bilaterally CHEST:  Unremarkable HEART:  PMI not displaced or sustained,S1 and S2 within normal limits, no S3, no S4, no clicks, no rubs, very soft apical nonradiating systolic murmur not increasing with the strain phase of Valsalva, no diastolic murmurs ABD:  Flat, positive bowel sounds normal in frequency in pitch, no bruits, no rebound, no guarding, no midline pulsatile mass, no hepatomegaly, no splenomegaly EXT:  2 plus pulses throughout, no edema, no cyanosis no clubbing   EKG:  EKG is not ordered today.    Recent Labs: 04/11/2022: Magnesium 2.0 04/22/2022: ALT 22; B Natriuretic Peptide 281.8; BUN 24; Creatinine, Ser 1.00; Hemoglobin 12.0; Platelets 208; Potassium 3.7; Sodium 136    Lipid Panel    Component Value Date/Time   CHOL  07/27/2009 0500    193        ATP III CLASSIFICATION:  <200     mg/dL   Desirable  07/29/2009  mg/dL   Borderline High  622-297    mg/dL   High          TRIG 77 07/27/2009 0500   HDL 35 (L) 07/27/2009 0500   CHOLHDL 5.5 07/27/2009 0500   VLDL 15 07/27/2009 0500   LDLCALC (H) 07/27/2009 0500    143        Total Cholesterol/HDL:CHD Risk Coronary Heart Disease Risk Table                     Men   Women  1/2 Average Risk   3.4   3.3  Average Risk       5.0   4.4  2 X Average Risk   9.6   7.1  3 X Average Risk  23.4   11.0        Use the calculated Patient Ratio above and the CHD Risk Table to determine the patient's CHD Risk.        ATP III CLASSIFICATION (LDL):  <100     mg/dL   Optimal  07/29/2009  mg/dL   Near or Above  Optimal  130-159  mg/dL   Borderline  967-591  mg/dL   High  >638     mg/dL   Very High      Wt  Readings from Last 3 Encounters:  07/21/22 194 lb 6.4 oz (88.2 kg)  05/26/22 205 lb 6.4 oz (93.2 kg)  04/11/22 228 lb (103.4 kg)      Other studies Reviewed: Additional studies/ records that were reviewed today include: EP notes.   Review of the above records demonstrates:  Please see elsewhere in the note.     ASSESSMENT AND PLAN:  HCM:    I again had a long discussion with him following up the appointment with Dr. Graciela Husbands.  He wants to hold off on deciding on an ICD.  He does agree to med titration for a slightly reduced ejection fraction.  I am going to stop his Norvasc.  He will stop his Vasotec.  In 2 days he is going to start Entresto 49/51 twice daily.  We will see him back in a couple of weeks for med titration.  I would like to see him back a few weeks after that.  I will have him see one of our APP's to help me with med titration.  Ultimately I would like Entresto, spironolactone and maybe [place].  Then in a few months I would repeat his echo.  I have moved his EP follow-up appointment with Dr. Graciela Husbands and we will have him wait until after I have repeated his echo in the months to come.  REDUCED EF: See above  HTN:      This is being managed in the context of treating his CHF  AO ENLARGEMENT:  The aorta measured 4.2 cm on MRI.  I will follow this with repeat imaging  Current medicines are reviewed at length with the patient today.  The patient does not have concerns regarding medicines.  The following changes have been made: As above  Labs/ tests ordered today include:     No orders of the defined types were placed in this encounter.     Disposition:   FU APP in two week and then me in about a month.       Signed, Rollene Rotunda, MD  07/21/2022 9:38 AM    [facility]

## 2022-07-21 ENCOUNTER — Ambulatory Visit (INDEPENDENT_AMBULATORY_CARE_PROVIDER_SITE_OTHER): Payer: Commercial Managed Care - HMO | Admitting: Cardiology

## 2022-07-21 ENCOUNTER — Encounter: Payer: Self-pay | Admitting: Cardiology

## 2022-07-21 VITALS — BP 121/81 | HR 64 | Ht 70.0 in | Wt 194.4 lb

## 2022-07-21 DIAGNOSIS — I422 Other hypertrophic cardiomyopathy: Secondary | ICD-10-CM | POA: Diagnosis not present

## 2022-07-21 DIAGNOSIS — I7789 Other specified disorders of arteries and arterioles: Secondary | ICD-10-CM

## 2022-07-21 DIAGNOSIS — I10 Essential (primary) hypertension: Secondary | ICD-10-CM | POA: Diagnosis not present

## 2022-07-21 MED ORDER — ENTRESTO 49-51 MG PO TABS: 1.0000 | ORAL_TABLET | Freq: Two times a day (BID) | ORAL | 5 refills | Status: DC

## 2022-07-21 NOTE — Patient Instructions (Addendum)
Medication Instructions:    STOP TAKING AMLODIPINE , AND VASOTEC   2 DAYS AFTER STOPPING VASOTEC  START TAKING ENTRESTO 49/51 MG ONE TABLET TWICE A DAY     *If you need a refill on your cardiac medications before your next appointment, please call your pharmacy*   Lab Work: NOT NEEDED   Testing/Procedures: NOT NEEDED   Follow-Up: At [facility], you and your health needs are our priority.  As part of our continuing mission to provide you with exceptional heart care, we have created designated Provider Care Teams.  These Care Teams include your primary Cardiologist (physician) and Advanced Practice Providers (APPs -  Physician Assistants and Nurse Practitioners) who all work together to provide you with the care you need, when you need it.  We recommend signing up for the patient portal called "MyChart".  Sign up information is provided on this After Visit Summary.  MyChart is used to connect with patients for Virtual Visits (Telemedicine).  Patients are able to view lab/test results, encounter notes, upcoming appointments, etc.  Non-urgent messages can be sent to your provider as well.   To learn more about what you can do with MyChart, go to [email or web address].    Your next appointment:   2 week(s)  The format for your next appointment:   In Person  Provider:   Micah Flesher, PA-C Then,  DR Graciela Husbands  will plan to see you again in 4 month(s).    Other Instructions  Will cancel appointment with Dr Graciela Husbands for now and reschedule for 4 months   Important Information About Sugar

## 2022-07-26 ENCOUNTER — Ambulatory Visit: Payer: 59 | Admitting: Internal Medicine

## 2022-07-26 DIAGNOSIS — I4729 Other ventricular tachycardia: Secondary | ICD-10-CM

## 2022-07-26 DIAGNOSIS — I428 Other cardiomyopathies: Secondary | ICD-10-CM

## 2022-07-26 DIAGNOSIS — I422 Other hypertrophic cardiomyopathy: Secondary | ICD-10-CM

## 2022-08-03 NOTE — Progress Notes (Unsigned)
Cardiology Office Note:    Date:  08/04/2022   ID:  Craig Prince, DOB [birth year], MRN [id number]  PCP:  Charlane Ferretti, DO   [facility] Providers Cardiologist:  Rollene Rotunda, MD Cardiology APP:  Marcelino Duster, PA { Referring MD: Charlane Ferretti, DO   Chief Complaint  Patient presents with   Follow-up    CHF, HCM    History of Present Illness:    Craig Prince is a 66 y.o. male with a hx of hypertrophic nonobstructive cardiomyopathy, nonischemic cardiomyopathy, hypertension, nonobstructive CAD by heart cath 06/2009.  Ischemic cardiomyopathy with an original ejection fraction 25% on echo in 2012 and 2014.  CT coronary showed normal coronaries and 0 calcium score.  Cardiac MRI demonstrated extensive mid wall gadolinium enhancement with septal wall thickening supporting the diagnosis of hypertrophic cardiomyopathy.  Cardiogram suggested that his EF was about 50%.  Septal anterior motion of the mitral valve was noted but no significant gradient.  Severe septal hypertrophy but no significant gradient or SAM.  LVEF 40%.  Due to 18 beat run of VT on heart monitor, EP was consulted for consideration of ICD placement.  He is going to think about this.  He last saw Dr. Antoine Poche 07/21/2022 for medication titration.  Norvasc and Vasotec was stopped.  He started 49-51 mg Entresto twice daily.  He presents today for follow-up and for further medication titration.  He has no cardiac complaints. He denies angina, dyspnea. Orthopnea, PND, and lower extremity edema. He is very surprised to learn we are still titrating medication and expresses he is not excited about adding agents. Through discussion and shared decision making, he is willing to try farxiga and possibly spironolactone.   Past Medical History:  Diagnosis Date   Arthritis    CAD (coronary artery disease)    NONOBSTRUCTIVE  a. cath 8/10: prox to mid LAD 20%   Chronic systolic heart failure (HCC)    Essential hypertension,  benign    Family history of breast cancer    Family history of prostate cancer    Gout    Gout    Hypertrophic nonobstructive cardiomyopathy (HCC)    septal hypertrophy without LVOT   NICM (nonischemic cardiomyopathy) (HCC)    a. echo 8/10: EF20-25%, mod LAE, severe asymmetric septal hypertrophy (? nonobs. HCM); grade 1 diast dyfxn.  EF 55% echo June 6    Past Surgical History:  Procedure Laterality Date   KNEE ARTHROSCOPY     right    Current Medications: Current Meds  Medication Sig   acetaminophen (TYLENOL) 325 MG tablet Take 2 tablets (650 mg total) by mouth every 4 (four) hours as needed for headache or mild pain.   allopurinol (ZYLOPRIM) 100 MG tablet Take 100 mg by mouth daily.   busPIRone (BUSPAR) 5 MG tablet Take 5 mg by mouth 2 (two) times daily.   carvedilol (COREG) 25 MG tablet TAKE 2 TABLETS BY MOUTH TWICE DAILY WITH MEALS   Cholecalciferol (VITAMIN D-3 PO) Take 1 tablet by mouth daily.   CINNAMON PO Take 1 tablet by mouth daily.   empagliflozin (JARDIANCE) 10 MG TABS tablet Take 1 tablet (10 mg total) by mouth daily before breakfast.   ferrous sulfate 325 (65 FE) MG tablet 1 tablet Orally Daily for 90 days   furosemide (LASIX) 40 MG tablet Take 1 tablet (40 mg total) by mouth daily.   LORazepam (ATIVAN) 0.5 MG tablet Take by mouth.   meloxicam (MOBIC) 7.5 MG tablet Take 7.5  mg by mouth daily as needed.   sacubitril-valsartan (ENTRESTO) 49-51 MG Take 1 tablet by mouth 2 (two) times daily.   traZODone (DESYREL) 50 MG tablet Take by mouth.     Allergies:   Patient has no known allergies.   Social History   Socioeconomic History   Marital status: Legally Separated    Spouse name: Not on file   Number of children: Not on file   Years of education: Not on file   Highest education level: Not on file  Occupational History   Not on file  Tobacco Use   Smoking status: Never   Smokeless tobacco: Never  Substance and Sexual Activity   Alcohol use: No     Alcohol/week: 0.0 standard drinks of alcohol   Drug use: No   Sexual activity: Not on file  Other Topics Concern   Not on file  Social History Narrative   Not on file   Social Determinants of Health   Financial Resource Strain: Not on file  Food Insecurity: Not on file  Transportation Needs: Not on file  Physical Activity: Not on file  Stress: Not on file  Social Connections: Not on file     Family History: The patient's family history includes Breast cancer (age of onset: 26) in his sister; Breast cancer (age of onset: 24) in his sister; CAD (age of onset: 61) in his brother; Cancer in his brother and sister; Diabetes in his mother; Healthy in his daughter and son; Heart attack (age of onset: 78) in his mother; Heart disease in his mother; Kidney disease in his brother and father.  ROS:   Please see the history of present illness.     All other systems reviewed and are negative.  EKGs/Labs/Other Studies Reviewed:    The following studies were reviewed today:  cMRI 02/2022: IMPRESSION: Study suggestive of hypertrophic cardiomyopathy. No LVOT obstructive demonstrated in this study.   LVEF 40%, with septal thickness of 22 mm and LGE quantification of 23.5%.   Compared to prior, LVEF is slightly decrease and septal thickness is slightly increased. LGE is extensive.   EKG:  EKG is not ordered today.    Recent Labs: 04/11/2022: Magnesium 2.0 04/22/2022: ALT 22; B Natriuretic Peptide 281.8; BUN 24; Creatinine, Ser 1.00; Hemoglobin 12.0; Platelets 208; Potassium 3.7; Sodium 136  Recent Lipid Panel    Component Value Date/Time   CHOL  07/27/2009 0500    193        ATP III CLASSIFICATION:  <200     mg/dL   Desirable  638-937  mg/dL   Borderline High  >=342    mg/dL   High          TRIG 77 07/27/2009 0500   HDL 35 (L) 07/27/2009 0500   CHOLHDL 5.5 07/27/2009 0500   VLDL 15 07/27/2009 0500   LDLCALC (H) 07/27/2009 0500    143        Total Cholesterol/HDL:CHD  Risk Coronary Heart Disease Risk Table                     Men   Women  1/2 Average Risk   3.4   3.3  Average Risk       5.0   4.4  2 X Average Risk   9.6   7.1  3 X Average Risk  23.4   11.0        Use the calculated Patient Ratio above and the CHD Risk Table to  determine the patient's CHD Risk.        ATP III CLASSIFICATION (LDL):  <100     mg/dL   Optimal  993-716  mg/dL   Near or Above                    Optimal  130-159  mg/dL   Borderline  967-893  mg/dL   High  >810     mg/dL   Very High     Risk Assessment/Calculations:      Physical Exam:    VS:  BP 130/82   Pulse 75   Ht 5\' 11"  (1.803 m)   Wt 200 lb (90.7 kg)   SpO2 98%   BMI 27.89 kg/m     Wt Readings from Last 3 Encounters:  08/04/22 200 lb (90.7 kg)  07/21/22 194 lb 6.4 oz (88.2 kg)  05/26/22 205 lb 6.4 oz (93.2 kg)     GEN:  Well nourished, well developed in no acute distress HEENT: Normal NECK: No JVD; No carotid bruits LYMPHATICS: No lymphadenopathy CARDIAC: RRR, no murmurs, rubs, gallops RESPIRATORY:  Clear to auscultation without rales, wheezing or rhonchi  ABDOMEN: Soft, non-tender, non-distended MUSCULOSKELETAL:  No edema; No deformity  SKIN: Warm and dry NEUROLOGIC:  Alert and oriented x 3 PSYCHIATRIC:  Normal affect   ASSESSMENT:    1. History of NICM- resolved at last echo   2. Hypertrophic nonobstructive cardiomyopathy (HCC)   3. Essential hypertension   4. Aortic root enlargement (HCC)   5. Hyperlipidemia LDL goal <100    PLAN:    In order of problems listed above:  Nonischemic cardiomyopathy Hypertrophic cardiomyopathy without LVOT Septal hypertrophy noted on cMRI Hypertension GDMT includes 25 mg Coreg twice daily, 40 mg Lasix daily, 49-51 Entresto twice daily BMP today Start Jardiance 10 mg If K is OK on BMP, start 12.5 mg spironolactone Stop lasix He will wait to pick up medication until he hears from 05/28/22 regarding labs.   Dilation of aorta 4.2 cm on MRI BP  control will be important Repeat imaging in 6 months   Hyperlipidemia  LDL was 164 in Jan 2023 Would prefer this to be under 100  Repeat echo in 3 months with follow up after.     Medication Adjustments/Labs and Tests Ordered: Current medicines are reviewed at length with the patient today.  Concerns regarding medicines are outlined above.  Orders Placed This Encounter  Procedures   Basic metabolic panel   ECHOCARDIOGRAM COMPLETE   Meds ordered this encounter  Medications   empagliflozin (JARDIANCE) 10 MG TABS tablet    Sig: Take 1 tablet (10 mg total) by mouth daily before breakfast.    Dispense:  30 tablet    Refill:  3    Patient Instructions  Medication Instructions:  Start Jardiance 10 mg ( Take 1 Tablet Daily). *If you need a refill on your cardiac medications before your next appointment, please call your pharmacy*   Lab Work: BMET Today If you have labs (blood work) drawn today and your tests are completely normal, you will receive your results only by: MyChart Message (if you have MyChart) OR A paper copy in the mail If you have any lab test that is abnormal or we need to change your treatment, we will call you to review the results.   Testing/Procedures: [address], [address]. Your physician has requested that you have an echocardiogram. Echocardiography is a painless test that uses sound waves to  create images of your heart. It provides your doctor with information about the size and shape of your heart and how well your heart's chambers and valves are working. This procedure takes approximately one hour. There are no restrictions for this procedure.    Follow-Up: At [facility], you and your health needs are our priority.  As part of our continuing mission to provide you with exceptional heart care, we have created designated Provider Care Teams.  These Care Teams include your primary Cardiologist (physician) and Advanced Practice  Providers (APPs -  Physician Assistants and Nurse Practitioners) who all work together to provide you with the care you need, when you need it.  We recommend signing up for the patient portal called "MyChart".  Sign up information is provided on this After Visit Summary.  MyChart is used to connect with patients for Virtual Visits (Telemedicine).  Patients are able to view lab/test results, encounter notes, upcoming appointments, etc.  Non-urgent messages can be sent to your provider as well.   To learn more about what you can do with MyChart, go to [email or web address].    Your next appointment:   3 month(s)  The format for your next appointment:   In Person  Provider:   Rollene Rotunda, MD     Signed, Roe Rutherford [place], [place]  08/04/2022 3:45 PM    [facility]

## 2022-08-04 ENCOUNTER — Encounter: Payer: Self-pay | Admitting: Physician Assistant

## 2022-08-04 ENCOUNTER — Ambulatory Visit: Payer: Commercial Managed Care - HMO | Attending: Physician Assistant | Admitting: Physician Assistant

## 2022-08-04 VITALS — BP 130/82 | HR 75 | Ht 71.0 in | Wt 200.0 lb

## 2022-08-04 DIAGNOSIS — I7789 Other specified disorders of arteries and arterioles: Secondary | ICD-10-CM | POA: Diagnosis not present

## 2022-08-04 DIAGNOSIS — I10 Essential (primary) hypertension: Secondary | ICD-10-CM

## 2022-08-04 DIAGNOSIS — I428 Other cardiomyopathies: Secondary | ICD-10-CM | POA: Diagnosis not present

## 2022-08-04 DIAGNOSIS — I422 Other hypertrophic cardiomyopathy: Secondary | ICD-10-CM

## 2022-08-04 DIAGNOSIS — E785 Hyperlipidemia, unspecified: Secondary | ICD-10-CM

## 2022-08-04 MED ORDER — EMPAGLIFLOZIN 10 MG PO TABS: 10.0000 mg | ORAL_TABLET | Freq: Every day | ORAL | 3 refills | Status: DC

## 2022-08-04 NOTE — Patient Instructions (Signed)
Medication Instructions:  Start Jardiance 10 mg ( Take 1 Tablet Daily). *If you need a refill on your cardiac medications before your next appointment, please call your pharmacy*   Lab Work: BMET Today If you have labs (blood work) drawn today and your tests are completely normal, you will receive your results only by: MyChart Message (if you have MyChart) OR A paper copy in the mail If you have any lab test that is abnormal or we need to change your treatment, we will call you to review the results.   Testing/Procedures: [address], [address]. Your physician has requested that you have an echocardiogram. Echocardiography is a painless test that uses sound waves to create images of your heart. It provides your doctor with information about the size and shape of your heart and how well your heart's chambers and valves are working. This procedure takes approximately one hour. There are no restrictions for this procedure.    Follow-Up: At [facility], you and your health needs are our priority.  As part of our continuing mission to provide you with exceptional heart care, we have created designated Provider Care Teams.  These Care Teams include your primary Cardiologist (physician) and Advanced Practice Providers (APPs -  Physician Assistants and Nurse Practitioners) who all work together to provide you with the care you need, when you need it.  We recommend signing up for the patient portal called "MyChart".  Sign up information is provided on this After Visit Summary.  MyChart is used to connect with patients for Virtual Visits (Telemedicine).  Patients are able to view lab/test results, encounter notes, upcoming appointments, etc.  Non-urgent messages can be sent to your provider as well.   To learn more about what you can do with MyChart, go to [email or web address].    Your next appointment:   3 month(s)  The format for your next appointment:   In  Person  Provider:   Rollene Rotunda, MD

## 2022-08-05 ENCOUNTER — Telehealth: Payer: Self-pay | Admitting: Cardiology

## 2022-08-05 LAB — BASIC METABOLIC PANEL
BUN/Creatinine Ratio: 18 (ref 10–24)
BUN: 21 mg/dL (ref 8–27)
CO2: 22 mmol/L (ref 20–29)
Calcium: 9.7 mg/dL (ref 8.6–10.2)
Chloride: 100 mmol/L (ref 96–106)
Creatinine, Ser: 1.15 mg/dL (ref 0.76–1.27)
Glucose: 89 mg/dL (ref 70–99)
Potassium: 3.7 mmol/L (ref 3.5–5.2)
Sodium: 140 mmol/L (ref 134–144)
eGFR: 70 mL/min/{1.73_m2} (ref >59)

## 2022-08-05 MED ORDER — DAPAGLIFLOZIN PROPANEDIOL 10 MG PO TABS: 10.0000 mg | ORAL_TABLET | Freq: Every day | ORAL | 11 refills | Status: DC

## 2022-08-05 MED ORDER — SPIRONOLACTONE 25 MG PO TABS
12.5000 mg | ORAL_TABLET | Freq: Every day | ORAL | 3 refills | Status: DC
Start: 2022-08-05 — End: 2023-05-30

## 2022-08-05 NOTE — Telephone Encounter (Signed)
Roe Rutherford [place], [place]  08/05/2022  8:06 AM EDT     Your labs look great. Your potassium is on the low end of normal. We can start the potassium sparing diuretic medication.    Please start 12.5 mg spironolactone Please start 10 mg farxiga. Stop scheduled lasix and use this as PRN.   Please let me know if the farxiga is cost prohibitive. We gave you samples of jardiance yesterday, but I'm getting a computer message saying your insurance covers farxiga better. So please do not take farxiga and jardiance together. You can start farxiga once you have finished the jardiance samples.     Patient aware of results and recommendations, verbalized understanding.   Patient also states echo was scheduled for September and he thought this was suppose to be 3 months out.    Per chart: echo was to be scheduled in 3 months.   Echo cancelled and will get rescheduled for correct time.    Patient aware.

## 2022-08-05 NOTE — Addendum Note (Signed)
Addended by: Duane Boston on: 08/05/2022 08:06 AM   Modules accepted: Orders

## 2022-08-05 NOTE — Telephone Encounter (Signed)
Patient was returning call. Please advise ?

## 2022-08-06 ENCOUNTER — Other Ambulatory Visit: Payer: Self-pay
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
## 2022-08-23 ENCOUNTER — Other Ambulatory Visit (HOSPITAL_COMMUNITY): Payer: Commercial Managed Care - HMO

## 2022-10-19 ENCOUNTER — Emergency Department (HOSPITAL_BASED_OUTPATIENT_CLINIC_OR_DEPARTMENT_OTHER)
Admission: EM | Admit: 2022-10-19 | Discharge: 2022-10-19 | Disposition: A | Discharge status: Home / Self Care | Payer: Commercial Managed Care - HMO | Attending: Emergency Medicine | Admitting: Emergency Medicine

## 2022-10-19 ENCOUNTER — Other Ambulatory Visit: Payer: Self-pay

## 2022-10-19 DIAGNOSIS — M10042 Idiopathic gout, left hand: Secondary | ICD-10-CM | POA: Diagnosis not present

## 2022-10-19 DIAGNOSIS — M79642 Pain in left hand: Secondary | ICD-10-CM | POA: Diagnosis present

## 2022-10-19 DIAGNOSIS — Z7901 Long term (current) use of anticoagulants: Secondary | ICD-10-CM | POA: Insufficient documentation

## 2022-10-19 MED ORDER — PREDNISONE 50 MG PO TABS
60.0000 mg | ORAL_TABLET | Freq: Once | ORAL | Status: AC
  Administered 2022-10-19: 60 mg via ORAL
  Filled 2022-10-19: qty 1

## 2022-10-19 MED ORDER — PREDNISONE 10 MG (21) PO TBPK: ORAL_TABLET | Freq: Every day | ORAL | 0 refills | Status: DC

## 2022-10-19 NOTE — Discharge Instructions (Addendum)
You were seen in the emergency department for pain in your left hand consistent with gout.  We are prescribing you a prednisone taper which has worked for you in the past.  Please continue Tylenol ibuprofen as needed.  Follow-up with your regular doctor.  Return if any worsening or concerning symptoms.

## 2022-10-19 NOTE — ED Notes (Signed)
Medication adminstered as ordered. Reviewed discharge instructions, follow up and medication with pt. Pt aware to pick up scripts at pharmacy. States understanding

## 2022-10-19 NOTE — ED Triage Notes (Signed)
Left hand pain x 4 days. Pt states it feels like gout. Pt has been taking allopurinol and colchicine last dose was yesterday but not getting any relief. No meds PTA.

## 2022-10-19 NOTE — ED Provider Notes (Signed)
[facility] EMERGENCY DEPARTMENT Provider Note   CSN: [id number] Arrival date & time: 10/19/22  1504     History {Add pertinent medical, surgical, social history, OB history to HPI:1} Chief Complaint  Patient presents with   Hand Pain    Craig Prince is a 66 y.o. male.  He is here with complaint of pain in his left fifth MCP joints has been going on for 4 days.  He has been using Tylenol allopurinol colchicine without improvement.  He said he has had it before and the thing that usually helps the best is prednisone.  No trauma no fevers.  He is right-hand dominant.  He has no history of diabetes  The history is provided by the patient.  Hand Pain This is a new problem. The current episode started more than 2 days ago. The problem occurs constantly. The problem has been gradually worsening. Pertinent negatives include no chest pain, no abdominal pain, no headaches and no shortness of breath. The symptoms are aggravated by bending. Treatments tried: medications. The treatment provided no relief.       Home Medications Prior to Admission medications   Medication Sig Start Date End Date Taking? Authorizing Provider  acetaminophen (TYLENOL) 325 MG tablet Take 2 tablets (650 mg total) by mouth every 4 (four) hours as needed for headache or mild pain. 12/29/16   Abelino Derrick, PA-C  allopurinol (ZYLOPRIM) 100 MG tablet Take 100 mg by mouth daily. 04/21/21   [provider]  busPIRone (BUSPAR) 5 MG tablet Take 5 mg by mouth 2 (two) times daily. 06/25/22   [provider]  carvedilol (COREG) 25 MG tablet TAKE 2 TABLETS BY MOUTH TWICE DAILY WITH MEALS 09/14/21   Rollene Rotunda, MD  Cholecalciferol (VITAMIN D-3 PO) Take 1 tablet by mouth daily.    [provider]  CINNAMON PO Take 1 tablet by mouth daily.    [provider]  dapagliflozin propanediol (FARXIGA) 10 MG TABS tablet Take 1 tablet (10 mg total) by mouth daily. 08/05/22   Marcelino Duster, PA  ferrous sulfate 325 (65 FE) MG tablet 1 tablet Orally Daily for 90 days 05/04/22   [provider]  furosemide (LASIX) 40 MG tablet Take 1 tablet (40 mg total) by mouth daily. 08/07/18   Rollene Rotunda, MD  LORazepam (ATIVAN) 0.5 MG tablet Take by mouth. 05/13/22   [provider]  meloxicam (MOBIC) 7.5 MG tablet Take 7.5 mg by mouth daily as needed. 12/11/21   [provider]  sacubitril-valsartan (ENTRESTO) 49-51 MG Take 1 tablet by mouth 2 (two) times daily. 07/21/22   Rollene Rotunda, MD  spironolactone (ALDACTONE) 25 MG tablet Take 0.5 tablets (12.5 mg total) by mouth daily. 08/05/22   [facility], Roe Rutherford, PA  traZODone (DESYREL) 50 MG tablet Take by mouth. 07/14/22   [provider]  APIXABAN Everlene Balls) VTE STARTER PACK (10MG  AND 5MG ) Take as directed on package: start with two-5mg  tablets twice daily for 7 days. On day 8, switch to one-5mg  tablet twice daily. 07/21/20 04/03/21  07/23/20, MD  colchicine 0.6 MG tablet Take 1 tablet (0.6 mg total) by mouth daily. 09/04/20 02/11/21  11/04/20, MD      Allergies    Patient has no known allergies.    Review of Systems   Review of Systems  Respiratory:  Negative for shortness of breath.   Cardiovascular:  Negative for chest pain.  Gastrointestinal:  Negative for abdominal pain.  Skin:  Negative for rash and wound.  Neurological:  Negative for headaches.    Physical Exam Updated Vital Signs BP (!) 155/94 (BP Location: Right Arm)   Pulse 77   Temp 98 F (36.7 C) (Oral)   Resp 20   Ht 5\' 10"  (1.778 m)   Wt 90.7 kg   SpO2 100%   BMI 28.70 kg/m  Physical Exam Vitals and nursing note reviewed.  Constitutional:      Appearance: Normal appearance. He is well-developed.  HENT:     Head: Normocephalic and atraumatic.  Eyes:     Conjunctiva/sclera: Conjunctivae normal.  Pulmonary:     Effort: Pulmonary effort is normal.  Musculoskeletal:        General: Swelling and tenderness present. No  deformity.     Cervical back: Neck supple.     Comments: Patient is swollen and tender over his fifth MCP.  He has full range of motion although discomfort with doing it.  There is minimal erythema and warmth.  No open wounds.  Distal sensorimotor intact.  Skin:    General: Skin is warm and dry.  Neurological:     Mental Status: He is alert.     GCS: GCS eye subscore is 4. GCS verbal subscore is 5. GCS motor subscore is 6.     ED Results / Procedures / Treatments   Labs (all labs ordered are listed, but only abnormal results are displayed) Labs Reviewed - No data to display  EKG None  Radiology No results found.  Procedures Procedures  {Document cardiac monitor, telemetry assessment procedure when appropriate:1}  Medications Ordered in ED Medications  predniSONE (DELTASONE) tablet 60 mg (has no administration in time range)    ED Course/ Medical Decision Making/ A&P                           Medical Decision Making  ***  {Document critical care time when appropriate:1} {Document review of labs and clinical decision tools ie heart score, Chads2Vasc2 etc:1}  {Document your independent review of radiology images, and any outside records:1} {Document your discussion with family members, caretakers, and with consultants:1} {Document social determinants of health affecting pt's care:1} {Document your decision making why or why not admission, treatments were needed:1} Final Clinical Impression(s) / ED Diagnoses Final diagnoses:  None    Rx / DC Orders ED Discharge Orders     None

## 2022-11-03 ENCOUNTER — Ambulatory Visit: Payer: Commercial Managed Care - HMO | Attending: Physician Assistant

## 2022-11-03 DIAGNOSIS — I5030 Unspecified diastolic (congestive) heart failure: Secondary | ICD-10-CM | POA: Diagnosis not present

## 2022-11-03 DIAGNOSIS — I422 Other hypertrophic cardiomyopathy: Secondary | ICD-10-CM | POA: Diagnosis not present

## 2022-11-03 DIAGNOSIS — I7789 Other specified disorders of arteries and arterioles: Secondary | ICD-10-CM | POA: Diagnosis not present

## 2022-11-03 DIAGNOSIS — I517 Cardiomegaly: Secondary | ICD-10-CM

## 2022-11-03 DIAGNOSIS — I428 Other cardiomyopathies: Secondary | ICD-10-CM | POA: Diagnosis present

## 2022-11-03 DIAGNOSIS — I77810 Thoracic aortic ectasia: Secondary | ICD-10-CM | POA: Diagnosis not present

## 2022-11-03 DIAGNOSIS — E785 Hyperlipidemia, unspecified: Secondary | ICD-10-CM | POA: Diagnosis present

## 2022-11-03 DIAGNOSIS — I361 Nonrheumatic tricuspid (valve) insufficiency: Secondary | ICD-10-CM | POA: Diagnosis not present

## 2022-11-03 DIAGNOSIS — I10 Essential (primary) hypertension: Secondary | ICD-10-CM | POA: Diagnosis not present

## 2022-11-03 LAB — ECHOCARDIOGRAM COMPLETE
Area-P 1/2: 3.12 cm2
S' Lateral: 4.15 cm

## 2022-11-03 NOTE — Progress Notes (Addendum)
Cardiology Office Note   Date:  11/04/2022   ID:  Craig Prince, DOB [birth year], MRN [id number]  PCP:  Craig Ferretti, DO  Cardiologist:   Craig Rotunda, MD   Chief Complaint  Patient presents with   Cardiomyopathy       History of Present Illness: Craig Prince is a 66 y.o. male who presents for followup of his hypertension and cardiomyopathy. He has a nonischemic cardiomyopathy with an original ejection fraction of 25% to 50% on followup in 2012 and 14.  I last saw him in  2019.  He has had a CT coronary angiogram with normal coronaries and zero calcium score.    Cardiac MRI demonstrated extensive mid wall gadolinium enhancement with septal wall thickening all supporting the diagnosis of hypertrophic cardiomyopathy.  An echocardiogram suggested that his EF was about 50%.  There was some septal anterior motion of the mitral valve.  However, there was no significant gradient.  He was seen [facility] at [facility].  On Echo he had a 42 mm aorta.  It looks like he had 20 mm septal thickening with an EF of 65 to 70%.  They describe severe septal hypertrophy but no significant gradient or SAM.  He had an MRI and had some features that are high risk with increased LGE, NSVT on monitor and 25 mm wall thickness.    He did have his genetic testing with results pending.  He talked with Craig Prince.  In addition he did have an 18 beat run of VT on the monitor.   He saw Craig Prince and he is considering ICD placement.    Of note his ejection fraction on the MRI was 40%.  Echo done yesterday demonstrated that the ejection fraction was 50 to 55% with severe asymmetric left ventricular hypertrophy as previously known.  Since I last saw him he is Craig Prince and had Craig Prince added to the meds.  He has done well.  He denies any new cardiovascular symptoms. The patient denies any new symptoms such as chest discomfort, neck or arm discomfort. There has been no new shortness of breath, PND or orthopnea.  There have been no reported palpitations, presyncope or syncope.    Past Medical History:  Diagnosis Date   Arthritis    CAD (coronary artery disease)    NONOBSTRUCTIVE  a. cath 8/10: prox to mid LAD 20%   Chronic systolic heart failure (HCC)    Essential hypertension, benign    Family history of breast cancer    Family history of prostate cancer    Gout    Gout    Hypertrophic nonobstructive cardiomyopathy (HCC)    septal hypertrophy without LVOT   NICM (nonischemic cardiomyopathy) (HCC)    a. echo 8/10: EF20-25%, mod LAE, severe asymmetric septal hypertrophy (? nonobs. HCM); grade 1 diast dyfxn.  EF 55% echo June 6    Past Surgical History:  Procedure Laterality Date   KNEE ARTHROSCOPY     right     Current Outpatient Medications  Medication Sig Dispense Refill   acetaminophen (TYLENOL) 325 MG tablet Take 2 tablets (650 mg total) by mouth every 4 (four) hours as needed for headache or mild pain.     allopurinol (ZYLOPRIM) 100 MG tablet Take 100 mg by mouth daily.     carvedilol (COREG) 25 MG tablet TAKE 2 TABLETS BY MOUTH TWICE DAILY WITH MEALS 120 tablet 3   Cholecalciferol (VITAMIN D-3 PO) Take 1 tablet  by mouth daily.     CINNAMON PO Take 1 tablet by mouth daily.     dapagliflozin propanediol (FARXIGA) 10 MG TABS tablet Take 1 tablet (10 mg total) by mouth daily. 30 tablet 11   sacubitril-valsartan (ENTRESTO) 97-103 MG Take 1 tablet by mouth 2 (two) times daily. 180 tablet 3   spironolactone (ALDACTONE) 25 MG tablet Take 0.5 tablets (12.5 mg total) by mouth daily. 90 tablet 3   No current facility-administered medications for this visit.    Allergies:   Patient has no known allergies.    ROS:  Please see the history of present illness.   Otherwise, review of systems are positive for back pain and knee pain.   All other systems are reviewed and negative.    PHYSICAL EXAM: VS:  BP 136/88   Pulse 62   Ht 5\' 10"  (1.778 m)   Wt 207 lb 9.6 oz (94.2 kg)   SpO2 95%    BMI 29.79 kg/m  , BMI Body mass index is 29.79 kg/m. GENERAL:  Well appearing NECK:  No jugular venous distention, waveform within normal limits, carotid upstroke brisk and symmetric, no bruits, no thyromegaly LUNGS:  Clear to auscultation bilaterally CHEST:  Unremarkable HEART:  PMI not displaced or sustained,S1 and S2 within normal limits, no S3, no S4, no clicks, no rubs, no murmurs ABD:  Flat, positive bowel sounds normal in frequency in pitch, no bruits, no rebound, no guarding, no midline pulsatile mass, no hepatomegaly, no splenomegaly EXT:  2 plus pulses throughout, no edema, no cyanosis no clubbing   EKG:  EKG is not ordered today.    Recent Labs: 04/11/2022: Magnesium 2.0 04/22/2022: ALT 22; B Natriuretic Peptide 281.8; Hemoglobin 12.0; Platelets 208 08/04/2022: BUN 21; Creatinine, Ser 1.15; Potassium 3.7; Sodium 140    Lipid Panel    Component Value Date/Time   CHOL  07/27/2009 0500    193        ATP III CLASSIFICATION:  <200     mg/dL   Desirable  07/29/2009  mg/dL   Borderline High  342-876    mg/dL   High          TRIG 77 07/27/2009 0500   HDL 35 (L) 07/27/2009 0500   CHOLHDL 5.5 07/27/2009 0500   VLDL 15 07/27/2009 0500   LDLCALC (H) 07/27/2009 0500    143        Total Cholesterol/HDL:CHD Risk Coronary Heart Disease Risk Table                     Men   Women  1/2 Average Risk   3.4   3.3  Average Risk       5.0   4.4  2 X Average Risk   9.6   7.1  3 X Average Risk  23.4   11.0        Use the calculated Patient Ratio above and the CHD Risk Table to determine the patient's CHD Risk.        ATP III CLASSIFICATION (LDL):  <100     mg/dL   Optimal  07/29/2009  mg/dL   Near or Above                    Optimal  130-159  mg/dL   Borderline  572-620  mg/dL   High  355-974     mg/dL   Very High      Wt Readings from Last 3 Encounters:  11/04/22 207 lb 9.6 oz (94.2 kg)  10/19/22 200 lb (90.7 kg)  08/04/22 200 lb (90.7 kg)      Other studies  Reviewed: Additional studies/ records that were reviewed today include: Echo  ASSESSMENT AND PLAN:  HCM:    I have had a long discussion with him following up the appointment with Craig Prince.  He has wanted to hold off on deciding on an ICD.    REDUCED EF:   His ejection fraction is low normal.  Med changes as below.   HTN:   I will increase Entresto to 97/103 twice daily.   AO ENLARGEMENT:  The aorta measured 4.2 cm on MRI.  We can follow this again in probably 2 years.   Current medicines are reviewed at length with the patient today.  The patient does not have concerns regarding medicines.  The following changes have been made: As above  Labs/ tests ordered today include:   None  Orders Placed This Encounter  Procedures   EKG 12-Lead      Disposition:   FU APP in 6 months   Signed, Craig Rotunda, MD  11/04/2022 9:48 AM    [facility]

## 2022-11-04 ENCOUNTER — Encounter: Payer: Self-pay | Admitting: Cardiology

## 2022-11-04 ENCOUNTER — Ambulatory Visit: Payer: Commercial Managed Care - HMO | Admitting: Cardiology

## 2022-11-04 VITALS — BP 136/88 | HR 62 | Ht 70.0 in | Wt 207.6 lb

## 2022-11-04 DIAGNOSIS — I422 Other hypertrophic cardiomyopathy: Secondary | ICD-10-CM

## 2022-11-04 DIAGNOSIS — E785 Hyperlipidemia, unspecified: Secondary | ICD-10-CM | POA: Insufficient documentation

## 2022-11-04 DIAGNOSIS — I428 Other cardiomyopathies: Secondary | ICD-10-CM | POA: Insufficient documentation

## 2022-11-04 DIAGNOSIS — I10 Essential (primary) hypertension: Secondary | ICD-10-CM | POA: Insufficient documentation

## 2022-11-04 DIAGNOSIS — I7789 Other specified disorders of arteries and arterioles: Secondary | ICD-10-CM | POA: Insufficient documentation

## 2022-11-04 MED ORDER — SACUBITRIL-VALSARTAN 97-103 MG PO TABS: 1.0000 | ORAL_TABLET | Freq: Two times a day (BID) | ORAL | 3 refills | Status: DC

## 2022-11-04 NOTE — Patient Instructions (Signed)
Medication Instructions:  Your physician has recommended you make the following change in your medication:  INCREASE: Entresto 97-103 twice daily  *If you need a refill on your cardiac medications before your next appointment, please call your pharmacy*   Lab Work: NONE If you have labs (blood work) drawn today and your tests are completely normal, you will receive your results only by: MyChart Message (if you have MyChart) OR A paper copy in the mail If you have any lab test that is abnormal or we need to change your treatment, we will call you to review the results.   Testing/Procedures: NONE   Follow-Up: At [facility], you and your health needs are our priority.  As part of our continuing mission to provide you with exceptional heart care, we have created designated Provider Care Teams.  These Care Teams include your primary Cardiologist (physician) and Advanced Practice Providers (APPs -  Physician Assistants and Nurse Practitioners) who all work together to provide you with the care you need, when you need it.  We recommend signing up for the patient portal called "MyChart".  Sign up information is provided on this After Visit Summary.  MyChart is used to connect with patients for Virtual Visits (Telemedicine).  Patients are able to view lab/test results, encounter notes, upcoming appointments, etc.  Non-urgent messages can be sent to your provider as well.   To learn more about what you can do with MyChart, go to [email or web address].    Your next appointment:   6 month(s)  The format for your next appointment:   In Person  Provider:   Rollene Rotunda, MD

## 2022-11-25 ENCOUNTER — Ambulatory Visit: Payer: 59 | Admitting: Internal Medicine

## 2022-11-25 DIAGNOSIS — I422 Other hypertrophic cardiomyopathy: Secondary | ICD-10-CM

## 2022-11-25 DIAGNOSIS — I4729 Other ventricular tachycardia: Secondary | ICD-10-CM

## 2023-05-02 ENCOUNTER — Ambulatory Visit: Payer: Commercial Managed Care - HMO | Admitting: Cardiology

## 2023-05-28 NOTE — Progress Notes (Unsigned)
Cardiology Office Note:   Date:  05/30/2023  ID:  [name], DOB 1956-01-15, MRN [id number] PCP: Charlane Ferretti, DO  [facility] Providers Cardiologist:  Rollene Rotunda, MD Cardiology APP:  Marcelino Duster, PA {  History of Present Illness:   Craig Prince is a 67 y.o. male e who presents for followup of his hypertension and cardiomyopathy. He has a nonischemic cardiomyopathy with an original ejection fraction of 25% to 50% on followup in 2012 and 14.  I last saw him in  2019.  He has had a CT coronary angiogram with normal coronaries and zero calcium score.    Cardiac MRI demonstrated extensive mid wall gadolinium enhancement with septal wall thickening all supporting the diagnosis of hypertrophic cardiomyopathy.  An echocardiogram suggested that his EF was about 50%.  There was some septal anterior motion of the mitral valve.  However, there was no significant gradient.  He was seen previously at [facility].  On Echo he had a 42 mm aorta.  It looks like he had 20 mm septal thickening with an EF of 65 to 70%.  They describe severe septal hypertrophy but no significant gradient or SAM.  He had an MRI and had some features that are high risk with increased LGE, NSVT on monitor and 25 mm wall thickness.    He did have his genetic testing with results pending.  He talked with Dr. Jomarie Longs.  In addition he did have an 18 beat run of VT on the monitor.   He saw Dr. Graciela Husbands and he is considering ICD placement.    Of note his ejection fraction on the MRI was 40%.  Echo done yesterday demonstrated that the ejection fraction was 50 to 55% with severe asymmetric left ventricular hypertrophy as previously known.   Since I last saw him he has had no new problems.  The patient denies any new symptoms such as chest discomfort, neck or arm discomfort. There has been no new shortness of breath, PND or orthopnea. There have been no reported palpitations, presyncope or syncope.   He is not walking because of  back and knee pain.  He uses a cane.  He has had breast tenderness on spironolactone.  ROS: As stated in the HPI and negative for all other systems.   Studies Reviewed:    EKG:   EKG Interpretation Date/Time:  Monday May 30 2023 07:56:42 EDT Ventricular Rate:  59 PR Interval:  214 QRS Duration:  140 QT Interval:  452 QTC Calculation: 447 R Axis:   -78  Text Interpretation: Sinus bradycardia with 1st degree A-V block Left axis deviation Non-specific intra-ventricular conduction block Minimal voltage criteria for LVH, may be normal variant ( ) T wave abnormality, consider lateral ischemia When compared with ECG of 22-Apr-2022 17:39, No significant change since last tracing Confirmed by Rollene Rotunda ([id number]) on 05/30/2023 8:15:50 AM As stated in the HPI and negative for all other systems.     Risk Assessment/Calculations:      Physical Exam:   VS:  BP (!) 140/98 (BP Location: Left Arm, Patient Position: Sitting, Cuff Size: Normal)   Pulse (!) 59   Ht 5\' 11"  (1.803 m)   Wt 212 lb 9.6 oz (96.4 kg)   SpO2 100%   BMI 29.65 kg/m    Wt Readings from Last 3 Encounters:  05/30/23 212 lb 9.6 oz (96.4 kg)  11/04/22 207 lb 9.6 oz (94.2 kg)  10/19/22 200 lb (90.7 kg)  GEN: Well nourished, well developed in no acute distress NECK: No JVD; No carotid bruits CARDIAC: RRR, no murmurs, rubs, gallops RESPIRATORY:  Clear to auscultation without rales, wheezing or rhonchi  ABDOMEN: Soft, non-tender, non-distended EXTREMITIES:  No edema; No deformity   ASSESSMENT AND PLAN:   HCM:    He has not consider an ICD.  No change in therapy.  I did review his last EP note with Dr. Graciela Husbands.  The patient does not want to follow-up to further consider defibrillator.  He has some risk factors with increased sleep gadolinium enhancement.  REDUCED EF:   His ejection fraction is low normal on echo in Dec 2023.     HTN:   He is getting worse tenderness on aspirin items of the weeks discontinue this.   Turns out he has not been taking his carvedilol twice a day and he is encouraged to take this as prescribed.   AO ENLARGEMENT:  The aorta measured 4.2 cm on MRI in April 2023.   I will order a CT after I see him next July.      Follow up with me in one year.   Signed, Rollene Rotunda, MD

## 2023-05-30 ENCOUNTER — Encounter: Payer: Self-pay | Admitting: Cardiology

## 2023-05-30 ENCOUNTER — Ambulatory Visit: Payer: BLUE CROSS/BLUE SHIELD | Attending: Cardiology | Admitting: Cardiology

## 2023-05-30 VITALS — BP 140/98 | HR 59 | Ht 71.0 in | Wt 212.6 lb

## 2023-05-30 DIAGNOSIS — I428 Other cardiomyopathies: Secondary | ICD-10-CM

## 2023-05-30 DIAGNOSIS — I10 Essential (primary) hypertension: Secondary | ICD-10-CM | POA: Diagnosis not present

## 2023-05-30 DIAGNOSIS — I7789 Other specified disorders of arteries and arterioles: Secondary | ICD-10-CM | POA: Diagnosis not present

## 2023-05-30 DIAGNOSIS — I422 Other hypertrophic cardiomyopathy: Secondary | ICD-10-CM

## 2023-05-30 MED ORDER — CARVEDILOL 25 MG PO TABS: 50.0000 mg | ORAL_TABLET | Freq: Two times a day (BID) | ORAL | 3 refills | Status: DC

## 2023-05-30 MED ORDER — DAPAGLIFLOZIN PROPANEDIOL 10 MG PO TABS: 10.0000 mg | ORAL_TABLET | Freq: Every day | ORAL | 3 refills | Status: DC

## 2023-05-30 MED ORDER — SACUBITRIL-VALSARTAN 97-103 MG PO TABS: 1.0000 | ORAL_TABLET | Freq: Two times a day (BID) | ORAL | 3 refills | Status: DC

## 2023-05-30 NOTE — Addendum Note (Signed)
Addended by: Freddi Starr on: 05/30/2023 08:20 AM   Modules accepted: Orders

## 2023-05-30 NOTE — Patient Instructions (Signed)
Medication Instructions:   STOP SPIRONOLACTONE  INCREASE CARVEDILOL TO TWICE DAILY  *If you need a refill on your cardiac medications before your next appointment, please call your pharmacy*   Follow-Up: At [facility], you and your health needs are our priority.  As part of our continuing mission to provide you with exceptional heart care, we have created designated Provider Care Teams.  These Care Teams include your primary Cardiologist (physician) and Advanced Practice Providers (APPs -  Physician Assistants and Nurse Practitioners) who all work together to provide you with the care you need, when you need it.  We recommend signing up for the patient portal called "MyChart".  Sign up information is provided on this After Visit Summary.  MyChart is used to connect with patients for Virtual Visits (Telemedicine).  Patients are able to view lab/test results, encounter notes, upcoming appointments, etc.  Non-urgent messages can be sent to your provider as well.   To learn more about what you can do with MyChart, go to [email or web address].    Your next appointment:   12 month(s)  Provider:   Rollene Rotunda, MD

## 2023-06-07 ENCOUNTER — Encounter (HOSPITAL_BASED_OUTPATIENT_CLINIC_OR_DEPARTMENT_OTHER): Payer: Self-pay | Admitting: Emergency Medicine

## 2023-06-07 ENCOUNTER — Emergency Department (HOSPITAL_BASED_OUTPATIENT_CLINIC_OR_DEPARTMENT_OTHER)
Admission: EM | Admit: 2023-06-07 | Discharge: 2023-06-07 | Disposition: A | Discharge status: Home / Self Care | Payer: BLUE CROSS/BLUE SHIELD | Attending: Emergency Medicine | Admitting: Emergency Medicine

## 2023-06-07 ENCOUNTER — Other Ambulatory Visit: Payer: Self-pay

## 2023-06-07 DIAGNOSIS — M109 Gout, unspecified: Secondary | ICD-10-CM | POA: Insufficient documentation

## 2023-06-07 DIAGNOSIS — M79642 Pain in left hand: Secondary | ICD-10-CM | POA: Diagnosis present

## 2023-06-07 MED ORDER — OXYCODONE-ACETAMINOPHEN 5-325 MG PO TABS: 1.0000 | ORAL_TABLET | Freq: Four times a day (QID) | ORAL | 0 refills | Status: DC | PRN

## 2023-06-07 MED ORDER — ONDANSETRON 4 MG PO TBDP: 4.0000 mg | ORAL_TABLET | Freq: Three times a day (TID) | ORAL | 0 refills | Status: DC | PRN

## 2023-06-07 MED ORDER — PREDNISONE 50 MG PO TABS
60.0000 mg | ORAL_TABLET | Freq: Once | ORAL | Status: AC
  Administered 2023-06-07: 60 mg via ORAL
  Filled 2023-06-07: qty 1

## 2023-06-07 MED ORDER — PREDNISONE 20 MG PO TABS: 20.0000 mg | ORAL_TABLET | ORAL | 0 refills | Status: DC

## 2023-06-07 MED ORDER — ONDANSETRON 4 MG PO TBDP
4.0000 mg | ORAL_TABLET | Freq: Once | ORAL | Status: AC
  Administered 2023-06-07: 4 mg via ORAL
  Filled 2023-06-07: qty 1

## 2023-06-07 MED ORDER — OXYCODONE-ACETAMINOPHEN 5-325 MG PO TABS
1.0000 | ORAL_TABLET | Freq: Once | ORAL | Status: AC
  Administered 2023-06-07: 1 via ORAL
  Filled 2023-06-07: qty 1

## 2023-06-07 NOTE — ED Provider Notes (Signed)
[facility] Provider Note   CSN: [id number] Arrival date & time: 06/07/23  1945     History  Chief Complaint  Patient presents with   Hand Pain    Craig Prince is a 67 y.o. male, history of CHF, gout, who presents to the ED secondary to left hand swelling for the last 4 days.  He states that he is taken Aleve, or at home without any relief.  Notes that it feels very similar to his past gout flare, and he states that he has had gout flares in multiple different spots.  Denies any trauma.  Denies any redness, but does note that is been very swollen.  Typically has prednisone for this flare, which he has relief with.     Home Medications Prior to Admission medications   Medication Sig Start Date End Date Taking? Authorizing Provider  ondansetron (ZOFRAN-ODT) 4 MG disintegrating tablet Take 1 tablet (4 mg total) by mouth every 8 (eight) hours as needed for nausea or vomiting. 06/07/23  Yes Lis Savitt L, PA  oxyCODONE-acetaminophen (PERCOCET/ROXICET) 5-325 MG tablet Take 1 tablet by mouth every 6 (six) hours as needed for severe pain. 06/07/23  Yes Sailor Hevia L, PA  predniSONE (DELTASONE) 20 MG tablet Take 1 tablet (20 mg total) by mouth See admin instructions. Take 3 pills on days 1-3, take 2 pills on days 4-6, take 1 pill on days 7-9, intake half pill on days 10-12 06/07/23  Yes Magdelene Ruark L, PA  acetaminophen (TYLENOL) 325 MG tablet Take 2 tablets (650 mg total) by mouth every 4 (four) hours as needed for headache or mild pain. 12/29/16   Abelino Derrick, PA-C  allopurinol (ZYLOPRIM) 100 MG tablet Take 100 mg by mouth daily. 04/21/21   [provider]  carvedilol (COREG) 25 MG tablet Take 2 tablets (50 mg total) by mouth 2 (two) times daily with a meal. 05/30/23   Rollene Rotunda, MD  Cholecalciferol (VITAMIN D-3 PO) Take 1 tablet by mouth daily.    [provider]  CINNAMON PO Take 1 tablet by mouth daily.    [provider]  dapagliflozin propanediol (FARXIGA) 10 MG TABS tablet Take 1 tablet (10 mg total) by mouth daily. 05/30/23   Rollene Rotunda, MD  sacubitril-valsartan (ENTRESTO) 97-103 MG Take 1 tablet by mouth 2 (two) times daily. 05/30/23   Rollene Rotunda, MD  APIXABAN Everlene Balls) VTE STARTER PACK (10MG  AND 5MG ) Take as directed on package: start with two-5mg  tablets twice daily for 7 days. On day 8, switch to one-5mg  tablet twice daily. 07/21/20 04/03/21  Tyrone Nine, MD  colchicine 0.6 MG tablet Take 1 tablet (0.6 mg total) by mouth daily. 09/04/20 02/11/21  Cathren Laine, MD      Allergies    Spironolactone    Review of Systems   Review of Systems  Musculoskeletal:        +Hand swelling  Skin:  Negative for rash.    Physical Exam Updated Vital Signs BP (!) 175/117 (BP Location: Right Arm)   Pulse 66   Temp 97.7 F (36.5 C) (Oral)   Resp 18   Ht 5\' 11"  (1.803 m)   Wt 96 kg   SpO2 98%   BMI 29.52 kg/m  Physical Exam Vitals and nursing note reviewed.  Constitutional:      General: He is not in acute distress.    Appearance: He is well-developed.  HENT:     Head:  Normocephalic and atraumatic.  Eyes:     General:        Right eye: No discharge.        Left eye: No discharge.     Conjunctiva/sclera: Conjunctivae normal.  Pulmonary:     Effort: No respiratory distress.  Musculoskeletal:     Comments: Diffusely edematous, and tender left hand.  No point tenderness, tender throughout.  No erythema.  Positive radial pulse  Neurological:     Mental Status: He is alert.     Comments: Clear speech.   Psychiatric:        Behavior: Behavior normal.        Thought Content: Thought content normal.     ED Results / Procedures / Treatments   Labs (all labs ordered are listed, but only abnormal results are displayed) Labs Reviewed - No data to display  EKG None  Radiology No results found.  Procedures Procedures    Medications Ordered in ED Medications   oxyCODONE-acetaminophen (PERCOCET/ROXICET) 5-325 MG per tablet 1 tablet (1 tablet Oral Given 06/07/23 2037)  ondansetron (ZOFRAN-ODT) disintegrating tablet 4 mg (4 mg Oral Given 06/07/23 2039)  predniSONE (DELTASONE) tablet 60 mg (60 mg Oral Given 06/07/23 2037)    ED Course/ Medical Decision Making/ A&P                             Medical Decision Making Patient is a 67 year old male, history of CHF, gout, who presents to the ED secondary to left hand swelling for the last few days.  Has been taking Aleve at home without any relief.  States it feels exactly like his past gout flares, however he has gout in different areas at different times.  Notes that he has not had any trauma, states that just hurts all over and feels exactly like his past gout pain.  Typically prednisone and Percocet to help with this.  He does have a diffusely swollen hand, with no history of trauma, we discussed treating with prednisone, and Percocets.  Sent to the pharmacy.  Educated on return precautions.  X-ray not obtained at this time given patient's preference, and lack of injury.  Informed will need to follow-up with primary care doctor.  Aligns with gout, given the swelling, pain, and history.  Risk Prescription drug management.    Final Clinical Impression(s) / ED Diagnoses Final diagnoses:  Acute gout of left hand, unspecified cause    Rx / DC Orders ED Discharge Orders          Ordered    predniSONE (DELTASONE) 20 MG tablet  See admin instructions        06/07/23 2037    oxyCODONE-acetaminophen (PERCOCET/ROXICET) 5-325 MG tablet  Every 6 hours PRN        06/07/23 2037    ondansetron (ZOFRAN-ODT) 4 MG disintegrating tablet  Every 8 hours PRN        06/07/23 2037              Courteney Alderete Elbert Ewings, PA 06/07/23 2124    Melene Plan, DO 06/07/23 2240

## 2023-06-07 NOTE — ED Triage Notes (Addendum)
Patient from home with L hand pain and swelling. Hx of gout states this has been flaring up for the last 4-5 days. Has been trying aleve at home with no relief. States when this happens usually a dose of steroids provides relief.

## 2023-06-07 NOTE — Discharge Instructions (Addendum)
Your findings are suspicious for gout, please return to the ER if your hand becomes were swollen, you have no pulse, or you have redness or worsening pain.  Take the medications as prescribed, and make sure you are taking a laxative such as MiraLAX with the Percocet, as it may cause constipation.

## 2023-07-09 ENCOUNTER — Other Ambulatory Visit: Payer: Self-pay

## 2023-07-09 ENCOUNTER — Emergency Department (HOSPITAL_BASED_OUTPATIENT_CLINIC_OR_DEPARTMENT_OTHER): Payer: Medicare Other

## 2023-07-09 ENCOUNTER — Inpatient Hospital Stay (HOSPITAL_BASED_OUTPATIENT_CLINIC_OR_DEPARTMENT_OTHER)
Admission: EM | Admit: 2023-07-09 | Discharge: 2023-07-13 | DRG: 291 | Disposition: A | Discharge status: Home / Self Care | Payer: Medicare Other | Attending: Internal Medicine | Admitting: Internal Medicine

## 2023-07-09 ENCOUNTER — Encounter (HOSPITAL_BASED_OUTPATIENT_CLINIC_OR_DEPARTMENT_OTHER): Payer: Self-pay | Admitting: Emergency Medicine

## 2023-07-09 DIAGNOSIS — I272 Pulmonary hypertension, unspecified: Secondary | ICD-10-CM | POA: Diagnosis present

## 2023-07-09 DIAGNOSIS — I4891 Unspecified atrial fibrillation: Secondary | ICD-10-CM | POA: Diagnosis present

## 2023-07-09 DIAGNOSIS — I110 Hypertensive heart disease with heart failure: Secondary | ICD-10-CM | POA: Diagnosis not present

## 2023-07-09 DIAGNOSIS — Z91199 Patient's noncompliance with other medical treatment and regimen due to unspecified reason: Secondary | ICD-10-CM

## 2023-07-09 DIAGNOSIS — Z841 Family history of disorders of kidney and ureter: Secondary | ICD-10-CM

## 2023-07-09 DIAGNOSIS — I5033 Acute on chronic diastolic (congestive) heart failure: Secondary | ICD-10-CM | POA: Insufficient documentation

## 2023-07-09 DIAGNOSIS — Z7901 Long term (current) use of anticoagulants: Secondary | ICD-10-CM

## 2023-07-09 DIAGNOSIS — R7989 Other specified abnormal findings of blood chemistry: Secondary | ICD-10-CM | POA: Insufficient documentation

## 2023-07-09 DIAGNOSIS — M109 Gout, unspecified: Secondary | ICD-10-CM | POA: Diagnosis present

## 2023-07-09 DIAGNOSIS — I255 Ischemic cardiomyopathy: Secondary | ICD-10-CM | POA: Diagnosis present

## 2023-07-09 DIAGNOSIS — N179 Acute kidney failure, unspecified: Secondary | ICD-10-CM | POA: Diagnosis present

## 2023-07-09 DIAGNOSIS — Z79899 Other long term (current) drug therapy: Secondary | ICD-10-CM

## 2023-07-09 DIAGNOSIS — I10 Essential (primary) hypertension: Secondary | ICD-10-CM | POA: Diagnosis present

## 2023-07-09 DIAGNOSIS — I2510 Atherosclerotic heart disease of native coronary artery without angina pectoris: Secondary | ICD-10-CM | POA: Diagnosis present

## 2023-07-09 DIAGNOSIS — T501X5A Adverse effect of loop [high-ceiling] diuretics, initial encounter: Secondary | ICD-10-CM | POA: Diagnosis not present

## 2023-07-09 DIAGNOSIS — I5043 Acute on chronic combined systolic (congestive) and diastolic (congestive) heart failure: Secondary | ICD-10-CM | POA: Diagnosis present

## 2023-07-09 DIAGNOSIS — Z8249 Family history of ischemic heart disease and other diseases of the circulatory system: Secondary | ICD-10-CM

## 2023-07-09 DIAGNOSIS — I4892 Unspecified atrial flutter: Secondary | ICD-10-CM | POA: Diagnosis present

## 2023-07-09 DIAGNOSIS — E876 Hypokalemia: Secondary | ICD-10-CM | POA: Diagnosis not present

## 2023-07-09 DIAGNOSIS — Z7984 Long term (current) use of oral hypoglycemic drugs: Secondary | ICD-10-CM

## 2023-07-09 DIAGNOSIS — Z803 Family history of malignant neoplasm of breast: Secondary | ICD-10-CM

## 2023-07-09 DIAGNOSIS — M1990 Unspecified osteoarthritis, unspecified site: Secondary | ICD-10-CM | POA: Diagnosis present

## 2023-07-09 DIAGNOSIS — I422 Other hypertrophic cardiomyopathy: Secondary | ICD-10-CM | POA: Diagnosis present

## 2023-07-09 DIAGNOSIS — I452 Bifascicular block: Secondary | ICD-10-CM | POA: Diagnosis present

## 2023-07-09 DIAGNOSIS — Z888 Allergy status to other drugs, medicaments and biological substances status: Secondary | ICD-10-CM

## 2023-07-09 DIAGNOSIS — I440 Atrioventricular block, first degree: Secondary | ICD-10-CM | POA: Diagnosis present

## 2023-07-09 DIAGNOSIS — R0602 Shortness of breath: Secondary | ICD-10-CM | POA: Diagnosis not present

## 2023-07-09 DIAGNOSIS — I77810 Thoracic aortic ectasia: Secondary | ICD-10-CM | POA: Diagnosis present

## 2023-07-09 DIAGNOSIS — I2489 Other forms of acute ischemic heart disease: Secondary | ICD-10-CM | POA: Diagnosis present

## 2023-07-09 DIAGNOSIS — Z833 Family history of diabetes mellitus: Secondary | ICD-10-CM

## 2023-07-09 DIAGNOSIS — Z8042 Family history of malignant neoplasm of prostate: Secondary | ICD-10-CM

## 2023-07-09 DIAGNOSIS — I5022 Chronic systolic (congestive) heart failure: Secondary | ICD-10-CM | POA: Diagnosis present

## 2023-07-09 LAB — TROPONIN I (HIGH SENSITIVITY)
Troponin I (High Sensitivity): 38 ng/L — ABNORMAL HIGH (ref <18)
Troponin I (High Sensitivity): 40 ng/L — ABNORMAL HIGH (ref <18)

## 2023-07-09 LAB — COMPREHENSIVE METABOLIC PANEL
ALT: 39 U/L (ref 0–44)
AST: 34 U/L (ref 15–41)
Albumin: 4 g/dL (ref 3.5–5.0)
Alkaline Phosphatase: 43 U/L (ref 38–126)
Anion gap: 10 (ref 5–15)
BUN: 24 mg/dL — ABNORMAL HIGH (ref 8–23)
CO2: 22 mmol/L (ref 22–32)
Calcium: 9 mg/dL (ref 8.9–10.3)
Chloride: 108 mmol/L (ref 98–111)
Creatinine, Ser: 1.32 mg/dL — ABNORMAL HIGH (ref 0.61–1.24)
GFR, Estimated: 59 mL/min — ABNORMAL LOW (ref >60)
Glucose, Bld: 102 mg/dL — ABNORMAL HIGH (ref 70–99)
Potassium: 3.9 mmol/L (ref 3.5–5.1)
Sodium: 140 mmol/L (ref 135–145)
Total Bilirubin: 1.2 mg/dL (ref 0.3–1.2)
Total Protein: 7 g/dL (ref 6.5–8.1)

## 2023-07-09 LAB — URINALYSIS, ROUTINE W REFLEX MICROSCOPIC
Bilirubin Urine: NEGATIVE
Glucose, UA: 250 mg/dL — AB
Ketones, ur: NEGATIVE mg/dL
Leukocytes,Ua: NEGATIVE
Nitrite: NEGATIVE
Protein, ur: 100 mg/dL — AB
Specific Gravity, Urine: >=1.03 (ref 1.005–1.030)
pH: 5.5 (ref 5.0–8.0)

## 2023-07-09 LAB — HIV ANTIBODY (ROUTINE TESTING W REFLEX): HIV Screen 4th Generation wRfx: NONREACTIVE

## 2023-07-09 LAB — URINALYSIS, MICROSCOPIC (REFLEX)

## 2023-07-09 LAB — TSH: TSH: 1.092 u[IU]/mL (ref 0.350–4.500)

## 2023-07-09 LAB — D-DIMER, QUANTITATIVE: D-Dimer, Quant: 0.78 ug/mL-FEU — ABNORMAL HIGH (ref 0.00–0.50)

## 2023-07-09 LAB — BRAIN NATRIURETIC PEPTIDE: B Natriuretic Peptide: 1638.5 pg/mL — ABNORMAL HIGH (ref 0.0–100.0)

## 2023-07-09 LAB — HEPARIN LEVEL (UNFRACTIONATED): Heparin Unfractionated: 0.38 IU/mL (ref 0.30–0.70)

## 2023-07-09 LAB — MAGNESIUM: Magnesium: 1.8 mg/dL (ref 1.7–2.4)

## 2023-07-09 MED ORDER — ACETAMINOPHEN 650 MG RE SUPP: 650.0000 mg | Freq: Four times a day (QID) | RECTAL | Status: DC | PRN

## 2023-07-09 MED ORDER — HEPARIN (PORCINE) 25000 UT/250ML-% IV SOLN
1350.0000 [IU]/h | INTRAVENOUS | Status: DC
  Administered 2023-07-09 – 2023-07-10 (×2): 1350 [IU]/h via INTRAVENOUS
  Filled 2023-07-09 (×2): qty 250

## 2023-07-09 MED ORDER — SACUBITRIL-VALSARTAN 97-103 MG PO TABS
1.0000 | ORAL_TABLET | Freq: Two times a day (BID) | ORAL | Status: DC
  Administered 2023-07-09 – 2023-07-13 (×8): 1 via ORAL
  Filled 2023-07-09 (×8): qty 1

## 2023-07-09 MED ORDER — FUROSEMIDE 10 MG/ML IJ SOLN
40.0000 mg | Freq: Once | INTRAMUSCULAR | Status: AC
  Administered 2023-07-09: 40 mg via INTRAVENOUS
  Filled 2023-07-09: qty 4

## 2023-07-09 MED ORDER — ACETAMINOPHEN 325 MG PO TABS
650.0000 mg | ORAL_TABLET | Freq: Four times a day (QID) | ORAL | Status: DC | PRN
  Administered 2023-07-09 – 2023-07-13 (×3): 650 mg via ORAL
  Filled 2023-07-09 (×4): qty 2

## 2023-07-09 MED ORDER — HEPARIN BOLUS VIA INFUSION
5500.0000 [IU] | Freq: Once | INTRAVENOUS | Status: AC
  Administered 2023-07-09: 5500 [IU] via INTRAVENOUS

## 2023-07-09 MED ORDER — ALBUTEROL SULFATE HFA 108 (90 BASE) MCG/ACT IN AERS: 2.0000 | INHALATION_SPRAY | RESPIRATORY_TRACT | Status: DC | PRN

## 2023-07-09 MED ORDER — IOHEXOL 350 MG/ML SOLN
100.0000 mL | Freq: Once | INTRAVENOUS | Status: AC | PRN
  Administered 2023-07-09: 100 mL via INTRAVENOUS

## 2023-07-09 MED ORDER — CARVEDILOL 25 MG PO TABS
50.0000 mg | ORAL_TABLET | Freq: Two times a day (BID) | ORAL | Status: DC
  Administered 2023-07-10: 50 mg via ORAL
  Filled 2023-07-09: qty 2

## 2023-07-09 MED ORDER — DAPAGLIFLOZIN PROPANEDIOL 10 MG PO TABS
10.0000 mg | ORAL_TABLET | Freq: Every day | ORAL | Status: DC
  Administered 2023-07-09 – 2023-07-13 (×5): 10 mg via ORAL
  Filled 2023-07-09 (×5): qty 1

## 2023-07-09 MED ORDER — METOPROLOL TARTRATE 5 MG/5ML IV SOLN: 5.0000 mg | Freq: Four times a day (QID) | INTRAVENOUS | Status: DC | PRN

## 2023-07-09 NOTE — ED Notes (Signed)
RT Note: When asked, the patient felt he did not need or want an Albuterol inhaler treatment at this time. Encouraged to call if changed mind.

## 2023-07-09 NOTE — ED Provider Notes (Signed)
[place] [facility] Provider Note   CSN: [id number] Arrival date & time: 07/09/23  2130     History  Chief Complaint  Patient presents with   Shortness of Breath    Craig Prince is a 67 y.o. male.   Shortness of Breath 67 year old male history of hypertension, nonischemic cardiomyopathy, heart failure, CAD presenting for shortness of breath.  Patient states for about the last 2 days she has had gradual onset shortness of breath.  He has not had any chest pain at all.  No orthopnea or leg swelling.  He has not felt like this before.  No change in his medications.  He has not had any fevers or chills or cough.  No abdominal pain or vomiting or diarrhea.     Home Medications Prior to Admission medications   Medication Sig Start Date End Date Taking? Authorizing Provider  acetaminophen (TYLENOL) 325 MG tablet Take 2 tablets (650 mg total) by mouth every 4 (four) hours as needed for headache or mild pain. 12/29/16   Abelino Derrick, PA-C  allopurinol (ZYLOPRIM) 100 MG tablet Take 100 mg by mouth daily. 04/21/21   [provider]  carvedilol (COREG) 25 MG tablet Take 2 tablets (50 mg total) by mouth 2 (two) times daily with a meal. 05/30/23   Rollene Rotunda, MD  Cholecalciferol (VITAMIN D-3 PO) Take 1 tablet by mouth daily.    [provider]  CINNAMON PO Take 1 tablet by mouth daily.    [provider]  dapagliflozin propanediol (FARXIGA) 10 MG TABS tablet Take 1 tablet (10 mg total) by mouth daily. 05/30/23   Rollene Rotunda, MD  ondansetron (ZOFRAN-ODT) 4 MG disintegrating tablet Take 1 tablet (4 mg total) by mouth every 8 (eight) hours as needed for nausea or vomiting. 06/07/23   Small, Brooke L, PA  oxyCODONE-acetaminophen (PERCOCET/ROXICET) 5-325 MG tablet Take 1 tablet by mouth every 6 (six) hours as needed for severe pain. 06/07/23   Small, Brooke L, PA  predniSONE (DELTASONE) 20 MG tablet Take 1 tablet (20 mg total) by mouth  See admin instructions. Take 3 pills on days 1-3, take 2 pills on days 4-6, take 1 pill on days 7-9, intake half pill on days 10-12 06/07/23   Small, Brooke L, PA  sacubitril-valsartan (ENTRESTO) 97-103 MG Take 1 tablet by mouth 2 (two) times daily. 05/30/23   Rollene Rotunda, MD  APIXABAN Everlene Balls) VTE STARTER PACK (10MG  AND 5MG ) Take as directed on package: start with two-5mg  tablets twice daily for 7 days. On day 8, switch to one-5mg  tablet twice daily. 07/21/20 04/03/21  Tyrone Nine, MD  colchicine 0.6 MG tablet Take 1 tablet (0.6 mg total) by mouth daily. 09/04/20 02/11/21  Cathren Laine, MD      Allergies    Spironolactone    Review of Systems   Review of Systems  Respiratory:  Positive for shortness of breath.   Review of systems completed and notable as per HPI.  ROS otherwise negative.   Physical Exam Updated Vital Signs BP (!) 128/116   Pulse 85   Temp 98.6 F (37 C) (Oral)   Resp 16   Wt 100.9 kg   SpO2 97%   BMI 31.02 kg/m  Physical Exam Vitals and nursing note reviewed.  Constitutional:      General: He is not in acute distress.    Appearance: He is well-developed.  HENT:     Head: Normocephalic and atraumatic.  Eyes:  Conjunctiva/sclera: Conjunctivae normal.  Neck:     Vascular: No JVD.  Cardiovascular:     Rate and Rhythm: Normal rate and regular rhythm.     Heart sounds: No murmur heard. Pulmonary:     Effort: Pulmonary effort is normal. No respiratory distress.     Breath sounds: Normal breath sounds.  Abdominal:     Palpations: Abdomen is soft.     Tenderness: There is no abdominal tenderness.  Musculoskeletal:        General: No swelling.     Cervical back: Neck supple.     Right lower leg: No edema.     Left lower leg: No edema.  Skin:    General: Skin is warm and dry.     Capillary Refill: Capillary refill takes less than 2 seconds.  Neurological:     General: No focal deficit present.     Mental Status: He is alert.  Psychiatric:        Mood  and Affect: Mood normal.     ED Results / Procedures / Treatments   Labs (all labs ordered are listed, but only abnormal results are displayed) Labs Reviewed  BRAIN NATRIURETIC PEPTIDE - Abnormal; Notable for the following components:      Result Value   B Natriuretic Peptide 1,638.5 (*)    All other components within normal limits  D-DIMER, QUANTITATIVE - Abnormal; Notable for the following components:   D-Dimer, Quant 0.78 (*)    All other components within normal limits  URINALYSIS, ROUTINE W REFLEX MICROSCOPIC - Abnormal; Notable for the following components:   Glucose, UA 250 (*)    Hgb urine dipstick SMALL (*)    Protein, ur 100 (*)    All other components within normal limits  CBC WITH DIFFERENTIAL/PLATELET - Abnormal; Notable for the following components:   RBC 6.13 (*)    MCV 71.8 (*)    MCH 22.7 (*)    RDW 18.8 (*)    All other components within normal limits  COMPREHENSIVE METABOLIC PANEL - Abnormal; Notable for the following components:   Glucose, Bld 102 (*)    BUN 24 (*)    Creatinine, Ser 1.32 (*)    GFR, Estimated 59 (*)    All other components within normal limits  URINALYSIS, MICROSCOPIC (REFLEX) - Abnormal; Notable for the following components:   Bacteria, UA RARE (*)    All other components within normal limits  TROPONIN I (HIGH SENSITIVITY) - Abnormal; Notable for the following components:   Troponin I (High Sensitivity) 38 (*)    All other components within normal limits  TROPONIN I (HIGH SENSITIVITY) - Abnormal; Notable for the following components:   Troponin I (High Sensitivity) 40 (*)    All other components within normal limits  CBC WITH DIFFERENTIAL/PLATELET  HEPARIN LEVEL (UNFRACTIONATED)    EKG EKG Interpretation Date/Time:  Saturday July 09 2023 08:12:19 EDT Ventricular Rate:  101 PR Interval:    QRS Duration:  134 QT Interval:  360 QTC Calculation: 467 R Axis:   -87  Text Interpretation: Atrial flutter RBBB and LAFB LVH with  secondary repolarization abnormality Confirmed by Fulton Reek [phone]) on 07/09/2023 8:26:08 AM  Radiology CT Angio Chest PE W/Cm &/Or Wo Cm  Result Date: 07/09/2023 CLINICAL DATA:  Pulmonary embolism (PE) suspected, low to intermediate prob, positive D-dimer EXAM: CT ANGIOGRAPHY CHEST WITH CONTRAST TECHNIQUE: Multidetector CT imaging of the chest was performed using the standard protocol during bolus administration of intravenous contrast. Multiplanar CT image  reconstructions and MIPs were obtained to evaluate the vascular anatomy. RADIATION DOSE REDUCTION: This exam was performed according to the departmental dose-optimization program which includes automated exposure control, adjustment of the mA and/or kV according to patient size and/or use of iterative reconstruction technique. CONTRAST:  OMNIPAQUE IOHEXOL 350 MG/ML SOLN COMPARISON:  04/22/2022 FINDINGS: Cardiovascular: SVC patent. Mild cardiomegaly with left atrial enlargement. Dilated central pulmonary arteries suggesting pulmonary hypertension. Satisfactory opacification of pulmonary arteries noted, and there is no evidence of pulmonary emboli. Mild scattered LAD coronary calcifications. Fair contrast opacification of the thoracic aorta with no evidence of dissection, aneurysm, or stenosis. There is bovine variant brachiocephalic arch anatomy without proximal stenosis. Scattered calcified atheromatous plaque in the arch and descending thoracic aorta. Mediastinum/Nodes: No mediastinal mass, hematoma, or adenopathy. Lungs/Pleura: Trace right pleural effusion has developed. No pneumothorax. Prominent septal lines per peripherally in both lung bases. No airspace disease or worrisome lesion. Upper Abdomen: No acute findings. Musculoskeletal: Spondylitic changes in the lower cervical spine. Vertebral endplate spurring at multiple levels in the mid and lower thoracic spine. Review of the MIP images confirms the above findings. IMPRESSION: 1. Negative  for acute PE or thoracic aortic dissection. 2.  Cardiomegaly with left atrial enlargement. 3. Dilated central pulmonary arteries suggesting pulmonary hypertension. 4. Trace right pleural effusion. 5.  Aortic Atherosclerosis (ICD10-I70.0). Electronically Signed   By: Corlis Leak M.D.   On: 07/09/2023 12:12   DG Chest 2 View  Result Date: 07/09/2023 CLINICAL DATA:  Dyspnea, CHF EXAM: CHEST - 2 VIEW COMPARISON:  04/22/2022 chest radiograph. FINDINGS: Stable cardiomediastinal silhouette with mild to moderate cardiomegaly. No pneumothorax. No pleural effusion. Mild pulmonary edema. No acute consolidative airspace disease. IMPRESSION: Mild congestive heart failure. Electronically Signed   By: Delbert Phenix M.D.   On: 07/09/2023 08:32    Procedures Procedures    Medications Ordered in ED Medications  albuterol (VENTOLIN HFA) 108 (90 Base) MCG/ACT inhaler 2 puff (has no administration in time range)  heparin ADULT infusion 100 units/mL (25000 units/264mL) (1,350 Units/hr Intravenous New Bag/Given 07/09/23 1325)  furosemide (LASIX) injection 40 mg (40 mg Intravenous Given 07/09/23 1059)  iohexol (OMNIPAQUE) 350 MG/ML injection 100 mL (100 mLs Intravenous Contrast Given 07/09/23 1139)  heparin bolus via infusion 5,500 Units (5,500 Units Intravenous Bolus from Bag 07/09/23 1325)    ED Course/ Medical Decision Making/ A&P Clinical Course as of 07/09/23 1540  Sat Jul 09, 2023  1241 Mullipadi [JD]    Clinical Course User Index [JD] Laurence Spates, MD                                 Medical Decision Making Amount and/or Complexity of Data Reviewed Labs: ordered. Radiology: ordered.  Risk Prescription drug management. Decision regarding hospitalization.   Medical Decision Making:   SABAN PEGG is a 67 y.o. male who presented to the ED today with 2 days of shortness of breath.  Vital signs notable for hypertension and mild tachypnea.  On exam he is overall well-appearing.  He does not look  grossly volume overloaded.  His EKG is concerning for new onset atrial flutter, I do not see any history of atrial fibrillation or atrial flutter.  I suspect this could be causing his symptoms.  He has been short of breath for about a couple days but does not have a clear onset of symptoms I do not think he would be a good candidate  for cardioversion especially as he is not anticoagulated.  Consider possible ACS as well as heart failure exacerbation, PE.   Patient placed on continuous vitals and telemetry monitoring while in ED which was reviewed periodically.  Reviewed and confirmed nursing documentation for past medical history, family history, social history.  Initial Study Results:   Laboratory  All laboratory results reviewed.  Labs notable for elevated BNP, elevated D-dimer, mildly elevated but baseline troponin.  EKG EKG was reviewed independently.  Radiology:  All images reviewed independently.  Pulmonary edema, cardiomegaly, no PE.  Agree with radiology report at this time.      Consults: Case discussed with hospitalist and cardiology.   Reassessment and Plan:   On reassessment he remains stable.  Heart rate is controlled without rate control.  Remains in atrial flutter.  Especially concerning for heart for exacerbation with atrial flutter.  Discussed with Dr. Roderic Scarce with cardiology who recommends heparin drip, transfer to [facility] for diuresis and further management.  He has been given Lasix.  Patient updated on plan, discussed with hospitalist and admitted.   Patient's presentation is most consistent with acute presentation with potential threat to life or bodily function.           Final Clinical Impression(s) / ED Diagnoses Final diagnoses:  Atrial flutter, unspecified type [facility])    Rx / DC Orders ED Discharge Orders     None         Laurence Spates, MD 07/09/23 1540

## 2023-07-09 NOTE — Assessment & Plan Note (Addendum)
This pm patient back into RVR, atrial flutter, he denies chest pain, dyspnea or palpitations.   He has been on carvedilol 25 mg po bid Plan to add IV metoprolol one dose of 2.5 mg and may repeat up to total of 5 mg.  If persistent tachycardia may need to start on amiodarone infusion. Will check with cardiology about antiarrhythmics.   Anticoagulation with apixaban. Plan for inpatient direct current cardioversion.

## 2023-07-09 NOTE — Assessment & Plan Note (Signed)
Has not had medication today Given lasix in ED Start back coreg 50mg  BID Entresto 97-103mg  IV parameters

## 2023-07-09 NOTE — Assessment & Plan Note (Signed)
Troponin flat 38>40, not indicative of ACS Likely demand ischemia in setting of new atrial flutter and acute on chronic CHF exacerbation  On telelmetry  Repeat echo pending

## 2023-07-09 NOTE — Consult Note (Addendum)
Cardiology Consultation   Patient ID: Craig Prince MRN: [id number]; DOB: 1956-09-15  Admit date: 07/09/2023 Date of Consult: 07/09/2023  PCP:  Charlane Ferretti, DO   [facility] Providers Cardiologist:  Rollene Rotunda, MD  Cardiology APP:  Marcelino Duster, PA  {   Patient Profile:   Craig Prince is a 67 y.o. male with a history of non-obstructive CAD on cardiac catheterization in 06/2009 and normal coronaries on coronary CTA in 11/2016, non-ischemic cardiomyopathy with EF as low as 25% in the remote past but has since normalized (50-55% in 10/2022), cardiomyopathy without LVOT obstruction, mild dilation of ascending aorta (4.2 cm on cardiac MRI in 02/2022), hypertension, and hyperlipidemia who is being seen 07/09/2023 for the evaluation of atrial flutter at the request of Dr. Earlene Plater (Emergency Department).  History of Present Illness:   Mr. Craig Prince is a 67 year old male with the above history who is followed by Dr. Antoine Poche.  Patient has known ischemic cardiomyopathy with a EF as low was 25% on Echo in 2012 and 2014.  EF is since normalized remote cath in 2010 showed minimal proximal to mid LAD disease but coronary CTA in 11/2016 showed normal coronaries with a coronary calcium score of 0.  Cardiac MRI in the past have been consistent with hypertrophic cardiomyopathy.  Last cardiac MRI in 02/2022 showed LVEF of 40% with severe asymmetric septal hypertrophy (interventricular septal thickness 22 mm and posterior wall thickness 7 mm) with extensive LGE quantification of 23.5%.  Findings were again suggestive of hypertrophic cardiomyopathy.  No LVOT obstruction was noted.  He was referred to EP given an episode of NSVT noted on prior monitor and seen by Dr. Graciela Husbands in 04/2022.  ICD was offered but patient wanted to think about this more.  Most recent echo in 10/2022 showed LVEF of 50-55% with severe asymmetric LVH of the septum up to 22 mm but no LVOT obstruction as well as grade 1 diastolic  dysfunction.  Patient was last seen by Dr. Antoine Poche on 05/30/2019 for at which time he was doing well from a cardiac standpoint.  He noted some breast tenderness on the Spironolactone so this was stopped.    Patient presented to the [facility] ED today for further evaluation of shortness of breath.  On arrival to the ED, he was hypertensive and slightly tachycardic.  EKG showed new onset atrial flutter, rate 101 bpm, with RBBB, LAFB, and nonspecific T wave changes.  High-sensitivity troponin minimally elevated and flat at 3840 consistent with demand ischemia.  BNP elevated at 1,638.  D-dimer elevated at 0.78.  Chest x-ray mild pulmonary edema. Chest CTA was negative for PE but did show cardiomegaly and dilated central pulmonary arteries suggestive of pulmonary hypertension as well as a trace right pleural effusion. WBC 8.8, Hgb 13.9, Plts 154. Na 140, K 3.9, Glucose 102, BUN 24, Cr 1.32. LFTs normal.  He was started on IV Heparin and given a dose of IV Lasix in the ED.  He has had good urine output with this so far so reports some shortness of breath.  He was transferred to [facility] under the Internal Medicine service and Cardiology consulted for further evaluation.   On this evaluation, patient is resting comfortably no acute distress.  He reports sudden onset of shortness of breath yesterday evening primarily when laying down at night to go to sleep.  He states he will lay down and start to doze off and then all of a sudden  feel like he cannot breathe.  He describes orthopnea.  No real PND.  He does report a little bit of dyspnea on exertion yesterday evening as well but symptoms are primarily at night when he lays down.  He has no significant edema on exam.  He does describes vague left-sided chest tightness that comes on intermittently; however, he states that he has had this for a long time and it is not any worse than usual.  Nothing that sounds like angina.  He reports he will occasionally feel  like his heart is racing or skipping a beat but this is very rare.  No significant palpitations.  No lightheadedness, dizziness, syncope.  Reports some loose stools the last couple days but no recent fevers or illnesses.  No cough or nasal congestion.  No abnormal bleeding in urine or stools.  He denies any tobacco or alcohol use.    Past Medical History:  Diagnosis Date   Arthritis    CAD (coronary artery disease)    NONOBSTRUCTIVE  a. cath 8/10: prox to mid LAD 20%   Chronic systolic heart failure (HCC)    Essential hypertension, benign    Family history of breast cancer    Family history of prostate cancer    Gout    Gout    Hypertrophic nonobstructive cardiomyopathy (HCC)    septal hypertrophy without LVOT   NICM (nonischemic cardiomyopathy) (HCC)    a. echo 8/10: EF20-25%, mod LAE, severe asymmetric septal hypertrophy (? nonobs. HCM); grade 1 diast dyfxn.  EF 55% echo June 6    Past Surgical History:  Procedure Laterality Date   KNEE ARTHROSCOPY     right     Home Medications:  Prior to Admission medications   Medication Sig Start Date End Date Taking? Authorizing Provider  acetaminophen (TYLENOL) 325 MG tablet Take 2 tablets (650 mg total) by mouth every 4 (four) hours as needed for headache or mild pain. 12/29/16   Abelino Derrick, PA-C  allopurinol (ZYLOPRIM) 100 MG tablet Take 100 mg by mouth daily. 04/21/21   [provider]  carvedilol (COREG) 25 MG tablet Take 2 tablets (50 mg total) by mouth 2 (two) times daily with a meal. 05/30/23   Rollene Rotunda, MD  Cholecalciferol (VITAMIN D-3 PO) Take 1 tablet by mouth daily.    [provider]  CINNAMON PO Take 1 tablet by mouth daily.    [provider]  dapagliflozin propanediol (FARXIGA) 10 MG TABS tablet Take 1 tablet (10 mg total) by mouth daily. 05/30/23   Rollene Rotunda, MD  ondansetron (ZOFRAN-ODT) 4 MG disintegrating tablet Take 1 tablet (4 mg total) by mouth every 8 (eight) hours as needed for  nausea or vomiting. 06/07/23   Small, Brooke L, PA  oxyCODONE-acetaminophen (PERCOCET/ROXICET) 5-325 MG tablet Take 1 tablet by mouth every 6 (six) hours as needed for severe pain. 06/07/23   Small, Brooke L, PA  predniSONE (DELTASONE) 20 MG tablet Take 1 tablet (20 mg total) by mouth See admin instructions. Take 3 pills on days 1-3, take 2 pills on days 4-6, take 1 pill on days 7-9, intake half pill on days 10-12 06/07/23   Small, Brooke L, PA  sacubitril-valsartan (ENTRESTO) 97-103 MG Take 1 tablet by mouth 2 (two) times daily. 05/30/23   Rollene Rotunda, MD  APIXABAN Everlene Balls) VTE STARTER PACK (10MG  AND 5MG ) Take as directed on package: start with two-5mg  tablets twice daily for 7 days. On day 8, switch to one-5mg  tablet twice daily.  07/21/20 04/03/21  Tyrone Nine, MD  colchicine 0.6 MG tablet Take 1 tablet (0.6 mg total) by mouth daily. 09/04/20 02/11/21  Cathren Laine, MD    Inpatient Medications: Scheduled Meds:  Continuous Infusions:  heparin 1,350 Units/hr (07/09/23 1325)   PRN Meds: albuterol  Allergies:    Allergies  Allergen Reactions   Spironolactone Other (See Comments)    Breast tenderness    Social History:   Social History   Socioeconomic History   Marital status: Married    Spouse name: Not on file   Number of children: Not on file   Years of education: Not on file   Highest education level: Not on file  Occupational History   Not on file  Tobacco Use   Smoking status: Never   Smokeless tobacco: Never  Substance and Sexual Activity   Alcohol use: No    Alcohol/week: 0.0 standard drinks of alcohol   Drug use: No   Sexual activity: Not on file  Other Topics Concern   Not on file  Social History Narrative   Not on file   Social Determinants of Health   Financial Resource Strain: Not on file  Food Insecurity: No Food Insecurity (07/09/2023)   Hunger Vital Sign    Worried About Running Out of Food in the Last Year: Never true    Ran Out of Food in the Last Year:  Never true  Transportation Needs: No Transportation Needs (07/09/2023)   PRAPARE - Administrator, Civil Service (Medical): No    Lack of Transportation (Non-Medical): No  Physical Activity: Not on file  Stress: Not on file  Social Connections: Not on file  Intimate Partner Violence: Not At Risk (07/09/2023)   Humiliation, Afraid, Rape, and Kick questionnaire    Fear of Current or Ex-Partner: No    Emotionally Abused: No    Physically Abused: No    Sexually Abused: No    Family History:   Family History  Problem Relation Age of Onset   Kidney disease Brother    Cancer Brother        prostate   Kidney disease Father    Diabetes Mother    Heart attack Mother 81   Heart disease Mother    Cancer Sister        breast   CAD Brother 49       60% blockage   Breast cancer Sister 26   Breast cancer Sister 54       PALB2 pos   Healthy Son    Healthy Daughter      ROS:  Please see the history of present illness.  Review of Systems  Constitutional:  Negative for fever.  HENT:  Negative for congestion.   Respiratory:  Positive for shortness of breath. Negative for cough.   Cardiovascular:  Positive for chest pain (minimal chest tightness), palpitations (rare) and orthopnea. Negative for leg swelling and PND.  Gastrointestinal:  Negative for blood in stool, melena, nausea and vomiting. Diarrhea: loose stools. Genitourinary:  Negative for hematuria.  Musculoskeletal:  Negative for myalgias.  Neurological:  Negative for dizziness and loss of consciousness.  Endo/Heme/Allergies:  Does not bruise/bleed easily.  Psychiatric/Behavioral:  Negative for substance abuse.      Physical Exam/Data:   Vitals:   07/09/23 1327 07/09/23 1500 07/09/23 1547 07/09/23 1650  BP: (!) 151/115 (!) 128/116 (!) 175/130 (!) 159/113  Pulse: 88 85  61  Resp: 18 16 16  (!) 21  Temp: 98.6  F (37 C)   98.8 F (37.1 C)  TempSrc: Oral   Oral  SpO2: 97% 97%  100%  Weight:        Intake/Output  Summary (Last 24 hours) at 07/09/2023 1829 Last data filed at 07/09/2023 1329 Gross per 24 hour  Intake --  Output 1650 ml  Net -1650 ml      07/09/2023    8:11 AM 06/07/2023    7:52 PM 05/30/2023    7:51 AM  Last 3 Weights  Weight (lbs) 222 lb 7.1 oz 211 lb 10.3 oz 212 lb 9.6 oz  Weight (kg) 100.9 kg 96 kg 96.435 kg     Body mass index is 31.02 kg/m.  General: 67 y.o. African-American male resting comfortably in no acute distress. HEENT: Normocephalic and atraumatic. Sclera clear.  Neck: Supple. No JVD. Heart: Mildly tachycardic with irregularly irregular rhythm. Distinct S1 and S2. No murmurs, gallops, or rubs.  Lungs: No increased work of breathing. Clear to ausculation bilaterally. No wheezes, rhonchi, or rales.  Abdomen: Soft, non-distended, and non-tender to palpation.  MSK: Normal strength and tone for age. Extremities: No lower extremity edema.    Skin: Warm and dry. Neuro: Alert and oriented x3. No focal deficits. Psych: Normal affect. Responds appropriately.  EKG:  The EKG was personally reviewed and demonstrates: Atrial flutter, rate 101 bpm, with RBBB, LAFB, and nonspecific T wave changes. Telemetry:  Telemetry was personally reviewed and demonstrates: Atrial flutter with rates in the 90s to low 100s.  Occasional PVCs.  Relevant CV Studies:  Coronary CTA 12/29/2016: 1) Normal left dominant coronary arteries 2) Calcium score 0 _______________  Cardiac MRI 03/10/2022: Study suggestive of hypertrophic cardiomyopathy. No LVOT obstructive demonstrated in this study.   LVEF 40%, with septal thickness of 22 mm and LGE quantification of 23.5%.   Compared to prior, LVEF is slightly decrease and septal thickness is slightly increased. LGE is extensive. _______________  Echocardiogram 11/03/2022: 1. Severe asymmetric LVH of the septum up to 22 mm consistent with known  history of hypertrophic cardiomyopathy. No LVOT obstruction. Left  ventricular ejection fraction, by  estimation, is 50 to 55%. The left  ventricle has low normal function. The left  ventricle demonstrates global hypokinesis. There is severe asymmetric left  ventricular hypertrophy of the septal segment. Left ventricular diastolic  parameters are consistent with Grade I diastolic dysfunction (impaired  relaxation). The average left  ventricular global longitudinal strain is -12.6 %. The global longitudinal  strain is abnormal.   2. Right ventricular systolic function is normal. The right ventricular  size is normal. There is normal pulmonary artery systolic pressure. The  estimated right ventricular systolic pressure is 30.1 mmHg.   3. Left atrial size was severely dilated.   4. Right atrial size was mild to moderately dilated.   5. The mitral valve is grossly normal. No evidence of mitral valve  regurgitation. No evidence of mitral stenosis.   6. The aortic valve is tricuspid. Aortic valve regurgitation is not  visualized. No aortic stenosis is present.   7. Aortic dilatation noted. There is mild dilatation of the aortic root,  measuring 40 mm. There is moderate dilatation of the ascending aorta,  measuring 42 mm.   8. The inferior vena cava is dilated in size with >50% respiratory  variability, suggesting right atrial pressure of 8 mmHg.    Laboratory Data:  High Sensitivity Troponin:   Recent Labs  Lab 07/09/23 0903 07/09/23 1019  TROPONINIHS 38* 40*  Chemistry Recent Labs  Lab 07/09/23 1019  NA 140  K 3.9  CL 108  CO2 22  GLUCOSE 102*  BUN 24*  CREATININE 1.32*  CALCIUM 9.0  GFRNONAA 59*  ANIONGAP 10    Recent Labs  Lab 07/09/23 1019  PROT 7.0  ALBUMIN 4.0  AST 34  ALT 39  ALKPHOS 43  BILITOT 1.2   Lipids No results for input(s): "CHOL", "TRIG", "HDL", "LABVLDL", "LDLCALC", "CHOLHDL" in the last 168 hours.  Hematology Recent Labs  Lab 07/09/23 0902  WBC 8.8  RBC 6.13*  HGB 13.9  HCT 44.0  MCV 71.8*  MCH 22.7*  MCHC 31.6  RDW 18.8*  PLT 154    Thyroid No results for input(s): "TSH", "FREET4" in the last 168 hours.  BNP Recent Labs  Lab 07/09/23 0832  BNP 1,638.5*    DDimer  Recent Labs  Lab 07/09/23 0903  DDIMER 0.78*     Radiology/Studies:  CT Angio Chest PE W/Cm &/Or Wo Cm  Result Date: 07/09/2023 CLINICAL DATA:  Pulmonary embolism (PE) suspected, low to intermediate prob, positive D-dimer EXAM: CT ANGIOGRAPHY CHEST WITH CONTRAST TECHNIQUE: Multidetector CT imaging of the chest was performed using the standard protocol during bolus administration of intravenous contrast. Multiplanar CT image reconstructions and MIPs were obtained to evaluate the vascular anatomy. RADIATION DOSE REDUCTION: This exam was performed according to the departmental dose-optimization program which includes automated exposure control, adjustment of the mA and/or kV according to patient size and/or use of iterative reconstruction technique. CONTRAST:  OMNIPAQUE IOHEXOL 350 MG/ML SOLN COMPARISON:  04/22/2022 FINDINGS: Cardiovascular: SVC patent. Mild cardiomegaly with left atrial enlargement. Dilated central pulmonary arteries suggesting pulmonary hypertension. Satisfactory opacification of pulmonary arteries noted, and there is no evidence of pulmonary emboli. Mild scattered LAD coronary calcifications. Fair contrast opacification of the thoracic aorta with no evidence of dissection, aneurysm, or stenosis. There is bovine variant brachiocephalic arch anatomy without proximal stenosis. Scattered calcified atheromatous plaque in the arch and descending thoracic aorta. Mediastinum/Nodes: No mediastinal mass, hematoma, or adenopathy. Lungs/Pleura: Trace right pleural effusion has developed. No pneumothorax. Prominent septal lines per peripherally in both lung bases. No airspace disease or worrisome lesion. Upper Abdomen: No acute findings. Musculoskeletal: Spondylitic changes in the lower cervical spine. Vertebral endplate spurring at multiple levels in  the mid and lower thoracic spine. Review of the MIP images confirms the above findings. IMPRESSION: 1. Negative for acute PE or thoracic aortic dissection. 2.  Cardiomegaly with left atrial enlargement. 3. Dilated central pulmonary arteries suggesting pulmonary hypertension. 4. Trace right pleural effusion. 5.  Aortic Atherosclerosis (ICD10-I70.0). Electronically Signed   By: Corlis Leak M.D.   On: 07/09/2023 12:12   DG Chest 2 View  Result Date: 07/09/2023 CLINICAL DATA:  Dyspnea, CHF EXAM: CHEST - 2 VIEW COMPARISON:  04/22/2022 chest radiograph. FINDINGS: Stable cardiomediastinal silhouette with mild to moderate cardiomegaly. No pneumothorax. No pleural effusion. Mild pulmonary edema. No acute consolidative airspace disease. IMPRESSION: Mild congestive heart failure. Electronically Signed   By: Delbert Phenix M.D.   On: 07/09/2023 08:32     Assessment and Plan:   Acute on Chronic Combined CHF Non-Ischemic Cardiomyopathy Hypertrophic Cardiomyopathy Patient has a long history of nonischemic cardiomyopathy with EF as low as 25% in the past.  However, EF has since normalized.  A CTA in 2018 showed coronary calcium score of 0 with normal coronary arteries.  Most recent cardiac MRI in 02/2022 showed LVEF of 40% with severe asymmetric septal hypertrophy (interventricular septal thickness  22 mm and posterior wall thickness 7 mm) with extensive LGE quantification of 23.5%.  Findings were again suggestive of hypertrophic cardiomyopathy.  Most recent echo in 10/2022 showed LVEF of 50-55% with severe asymmetric LVH of the septum up to 22 mm but no LVOT obstruction as well as grade 1 diastolic dysfunction.  He presented with sudden onset of shortness of breath primarily when laying down at night.  Chest x-ray showed mild pulmonary edema.  BNP was elevated in the 1600s.  D-dimer elevated but CTA negative for PE.  He was given a dose of IV Lasix with 1.65 L of urine output so far. - He has had good urine output. He does  not look significantly volume overloaded on exam but still has some orthopnea.  - Will give another dose of IV Lasix 40mg  tonight and then evaluated. - Continue Entresto 97-103 mg twice daily. - Continue Coreg 50 mg twice daily. - Continue Farxiga 10 mg daily. - Previously unable to tolerate Spironolactone due to breast tenderness. - Creatinine slightly above baseline. Suspect this will improve with diuresis. - Suspect exacerbation is from new onset atrial flutter. Will likely need repeat TEE/ DCCV. - Of note, he has been seen by EP in the past and ICD was offered given high risk features on cardiac MRI and one run of NSVT on prior monitor but patient declined.   New Onset Atrial Fibrillation Rates reasonably well controlled in the 90s to low 110s. - Continue Coreg 50mg  twice daily.  - CHA2DS2-VASc = 3 (CHF, HTN, age). Currently on IV Heparin. Will go ahead and switch him to Eliquis 5mg  twice daily. - Do not suspect he will cardiovert on his on. Suspect he will need TEE/ DCCV this admission.  Demand Ischemia High-sensitivity troponin minimally elevated and flat at 38 >> 40. - He reports some vague chest tightness which he states is not new. He has this intermittently and it is not any worse than usual. Does not sounds like angina. - Troponin elevation not consistent with ACS. Consistent with demand ischemic in setting of new atrial flutter and acute CHF. Will diuresis and try to restore sinus rhythm as exam.   Hypertension BP mildly elevated. - Continue medication for CHF as above.  Dilated Ascending Aorta Cardiac MRI in 02/2022 showed mildly dilated ascending aorta measuring 4.2 cm. Echo in 10/2022 showed mild dilatation of the aortic root and moderate dilatation of the ascending aorta measuring 40 and 42 mm. - Can continue to monitor as outpatient.   Risk Assessment/Risk Scores:    New York Heart Association (NYHA) Functional Class NYHA Class II.   CHA2DS2-VASc Score = 3  This  indicates a 3.2% annual risk of stroke. The patient's score is based upon: CHF History: 1 HTN History: 1 Diabetes History: 0 Stroke History: 0 Vascular Disease History: 0 Age Score: 1 Gender Score: 0    For questions or updates, please contact [facility] Please consult [email or web address] for contact info under    Signed, Corrin Parker, PA-C  07/09/2023 6:29 PM   Personally seen and examined. Agree with APP above with the following comments:  Briefly 67 yo M with a history of nonobstructive HCM with burden LV, no LVOT gradient and LVEF ~ 45% with fluctuations.  Presently with new AFL and acute heart failure.    Patient notes one week prior having no symptoms.  No sudden onset palpitations, but found to have new SOB and DOE.  His orthopnea  In ED found to  have AFL.  He has respond well to lasix but still has orthopnea.  Exam notable for IRIR tachycardia with crackles in the bases, and systolic murmur.  Otherwise as above  Labs notable for slight increase in creatinine 1.31, with troponin < 100. Tele: AFL  with LAFB and RBBB  Would recommend  - re-bolus IV lasix - continue GDMT despite slight increase in creatinine - given aldactone gynecomastia will defer eplerenone challenge to outpatient - given HCM and AFL he will likely need a rhythm control strategy - I suspect his LVEF will be decreased slightly in rhythm - goal is euvolemia, then TEE/DCCV if he does not self convert - he has 7 children and has no hx of severe uncontrolled HTN, his children have yet to be screened for HCM; we discussed this.  Riley Lam, MD FASE [facility] Cardiologist [facility]  [address] [place], [address] [place], [place] [address] [phone]  6:59 PM

## 2023-07-09 NOTE — Assessment & Plan Note (Deleted)
Not taking allopurinol as prescribed

## 2023-07-09 NOTE — Care Plan (Signed)
  PROGRESS NOTE    Craig Prince  ZOX:[id number] DOB: 06-Dec-1955 DOA: 07/09/2023 PCP: Charlane Ferretti, DO   Brief Narrative:  Patient presents to [facility] with symptomatic atrial flutter(new diagnosis) and hypertensive urgency with markedly elevated BNP.  Patient also noted to have elevated D-dimer subsequent CTA for PE was negative for filling defect but notable for pleural effusions, dilated pulmonary arteries and cardiomegaly.  Dr. Marjo Bicker with cardiology was contacted by ED physician Dr. Earlene Plater who recommended admission for further workup and evaluation.  Azucena Fallen, DO [facility]  If 7PM-7AM, please contact night-coverage [email or web address]  07/09/2023, 12:56 PM

## 2023-07-09 NOTE — Assessment & Plan Note (Deleted)
67 year old male presenting to ED with acute onset of shortness of breath for one day that progressed as well as orthopnea found to be in acute on chronic combined CHF exacerbation with elevated bnp >1500, CXR showing pulmonary edema and symptomatic SOB.  -obs to tele -known nonischemic cardiomyopathy with an original ejection fraction of 25% to 50% on followup in 2012 and 2014, but echo in 2019 showed improvement of EF to 50-55%. Most recent echo in 12/23: EF 50-55%. The LV has global hypokinesis. There is severe asymmetric left  ventricular hypertrophy of the septal segment. Grade 1 DD.  -will repeat echo in case of DCCV  -likely driven into CHF by newly diagnosed atrial fib/flutter  -given one dose of lasix in ED, cardiology giving 40mg  tonight -continue coreg, farxiga, entresto -did not tolerate aldactone due to gynecomastia  -strict I/O and daily weights

## 2023-07-09 NOTE — H&P (Signed)
History and Physical    Patient: Craig Prince ZOX:[id number] DOB: 15-Apr-1956 DOA: 07/09/2023 DOS: the patient was seen and examined on 07/09/2023 PCP: Charlane Ferretti, DO  Patient coming from:  [facility]  - lives alone. Uses a cane at times.    Chief Complaint: shortness of breath   HPI: Craig Prince is a 67 y.o. male with medical history significant of nonobstructive CAD, systolic CHF, HTN, gout, hypertrophic nonobstructive cardiomyopathy who presented to ED with complaints of shortness of breath that he started to notice yesterday evening. He was walking inside his apartment and noticed he was more short of breath. He states the shortness of breath got worse this morning and it prompted him to go to ED. No leg swelling, but he does have orthopnea or feels like he stops breathing lying flat. He denies any chest pain or palpitations. He does drink caffeine-an extra large black coffee, but has not drank any coffee in a couple of days. He did take 1/2 of an aldactone last night.    Denies any fever/chills, vision changes/headaches, chest pain or palpitations, abdominal pain, N/V/D, dysuria or leg swelling. He does state his stools were loose for a few days for 3-4 days, has not had a BM today. No abdominal pain, but states maybe an upset stomach like when you have a GI illness. He denies any raw foods/sushi. He noticed he had loose stool after getting food at [employer]. He got steak and chicken.    He does not smoke or drink alcohol.   ER Course:  vitals: afebrile, bp: 151/115, HR: 104, RR: 18, oxygen: 97%RA Pertinent labs: BNP: 1638.5, troponin 38>40, d-dimer: .78, BUN: 24, creatinine: 1.32,  CXR: mild CHF CTA chest: no PE, cardiomegaly with left atrial enlargement. Dilated central pulmonary arteries suggesting pulmonary HTN, trace right pleural effusion.  In ED: started on heparin gtt, cardiology consulted. Given 40mg  of lasix. TRH asked to admit   Review of Systems: As mentioned in the  history of present illness. All other systems reviewed and are negative. Past Medical History:  Diagnosis Date   Arthritis    CAD (coronary artery disease)    NONOBSTRUCTIVE  a. cath 8/10: prox to mid LAD 20%   Chronic systolic heart failure (HCC)    Essential hypertension, benign    Family history of breast cancer    Family history of prostate cancer    Gout    Gout    Hypertrophic nonobstructive cardiomyopathy (HCC)    septal hypertrophy without LVOT   NICM (nonischemic cardiomyopathy) (HCC)    a. echo 8/10: EF20-25%, mod LAE, severe asymmetric septal hypertrophy (? nonobs. HCM); grade 1 diast dyfxn.  EF 55% echo June 6   Past Surgical History:  Procedure Laterality Date   KNEE ARTHROSCOPY     right   Social History:  reports that he has never smoked. He has never used smokeless tobacco. He reports that he does not drink alcohol and does not use drugs.  Allergies  Allergen Reactions   Spironolactone Other (See Comments)    Breast tenderness    Family History  Problem Relation Age of Onset   Kidney disease Brother    Cancer Brother        prostate   Kidney disease Father    Diabetes Mother    Heart attack Mother 12   Heart disease Mother    Cancer Sister        breast   CAD Brother 73  60% blockage   Breast cancer Sister 68   Breast cancer Sister 49       PALB2 pos   Healthy Son    Healthy Daughter     Prior to Admission medications   Medication Sig Start Date End Date Taking? Authorizing Provider  acetaminophen (TYLENOL) 325 MG tablet Take 2 tablets (650 mg total) by mouth every 4 (four) hours as needed for headache or mild pain. 12/29/16   Abelino Derrick, PA-C  allopurinol (ZYLOPRIM) 100 MG tablet Take 100 mg by mouth daily. 04/21/21   [provider]  carvedilol (COREG) 25 MG tablet Take 2 tablets (50 mg total) by mouth 2 (two) times daily with a meal. 05/30/23   Rollene Rotunda, MD  Cholecalciferol (VITAMIN D-3 PO) Take 1 tablet by mouth daily.     [provider]  CINNAMON PO Take 1 tablet by mouth daily.    [provider]  dapagliflozin propanediol (FARXIGA) 10 MG TABS tablet Take 1 tablet (10 mg total) by mouth daily. 05/30/23   Rollene Rotunda, MD  ondansetron (ZOFRAN-ODT) 4 MG disintegrating tablet Take 1 tablet (4 mg total) by mouth every 8 (eight) hours as needed for nausea or vomiting. 06/07/23   Small, Brooke L, PA  oxyCODONE-acetaminophen (PERCOCET/ROXICET) 5-325 MG tablet Take 1 tablet by mouth every 6 (six) hours as needed for severe pain. 06/07/23   Small, Brooke L, PA  predniSONE (DELTASONE) 20 MG tablet Take 1 tablet (20 mg total) by mouth See admin instructions. Take 3 pills on days 1-3, take 2 pills on days 4-6, take 1 pill on days 7-9, intake half pill on days 10-12 06/07/23   Small, Brooke L, PA  sacubitril-valsartan (ENTRESTO) 97-103 MG Take 1 tablet by mouth 2 (two) times daily. 05/30/23   Rollene Rotunda, MD  APIXABAN Everlene Balls) VTE STARTER PACK (10MG  AND 5MG ) Take as directed on package: start with two-5mg  tablets twice daily for 7 days. On day 8, switch to one-5mg  tablet twice daily. 07/21/20 04/03/21  Tyrone Nine, MD  colchicine 0.6 MG tablet Take 1 tablet (0.6 mg total) by mouth daily. 09/04/20 02/11/21  Cathren Laine, MD    Physical Exam: Vitals:   07/09/23 1327 07/09/23 1500 07/09/23 1547 07/09/23 1650  BP: (!) 151/115 (!) 128/116 (!) 175/130 (!) 159/113  Pulse: 88 85  61  Resp: 18 16 16  (!) 21  Temp: 98.6 F (37 C)   98.8 F (37.1 C)  TempSrc: Oral   Oral  SpO2: 97% 97%  100%  Weight:       General:  Appears calm and comfortable and is in NAD Eyes:  PERRL, EOMI, normal lids, iris ENT:  grossly normal hearing, lips & tongue, mmm; appropriate dentition Neck:  no LAD, masses or thyromegaly; no carotid bruits, no JVD  Cardiovascular:  irregularly, irregular, no m/r/g. No LE edema.  Respiratory:   CTA bilaterally with no wheezes/rales/rhonchi.  Normal respiratory effort. Abdomen:  soft, NT, ND,  NABS Back:   normal alignment, no CVAT Skin:  no rash or induration seen on limited exam Musculoskeletal:  grossly normal tone BUE/BLE, good ROM, no bony abnormality Lower extremity:  No LE edema.  Limited foot exam with no ulcerations.  2+ distal pulses. Psychiatric:  grossly normal mood and affect, speech fluent and appropriate, AOx3 Neurologic:  CN 2-12 grossly intact, moves all extremities in coordinated fashion, sensation intact   Radiological Exams on Admission: Independently reviewed - see discussion in A/P where applicable  CT Angio Chest  PE W/Cm &/Or Wo Cm  Result Date: 07/09/2023 CLINICAL DATA:  Pulmonary embolism (PE) suspected, low to intermediate prob, positive D-dimer EXAM: CT ANGIOGRAPHY CHEST WITH CONTRAST TECHNIQUE: Multidetector CT imaging of the chest was performed using the standard protocol during bolus administration of intravenous contrast. Multiplanar CT image reconstructions and MIPs were obtained to evaluate the vascular anatomy. RADIATION DOSE REDUCTION: This exam was performed according to the departmental dose-optimization program which includes automated exposure control, adjustment of the mA and/or kV according to patient size and/or use of iterative reconstruction technique. CONTRAST:  OMNIPAQUE IOHEXOL 350 MG/ML SOLN COMPARISON:  04/22/2022 FINDINGS: Cardiovascular: SVC patent. Mild cardiomegaly with left atrial enlargement. Dilated central pulmonary arteries suggesting pulmonary hypertension. Satisfactory opacification of pulmonary arteries noted, and there is no evidence of pulmonary emboli. Mild scattered LAD coronary calcifications. Fair contrast opacification of the thoracic aorta with no evidence of dissection, aneurysm, or stenosis. There is bovine variant brachiocephalic arch anatomy without proximal stenosis. Scattered calcified atheromatous plaque in the arch and descending thoracic aorta. Mediastinum/Nodes: No mediastinal mass, hematoma, or adenopathy.  Lungs/Pleura: Trace right pleural effusion has developed. No pneumothorax. Prominent septal lines per peripherally in both lung bases. No airspace disease or worrisome lesion. Upper Abdomen: No acute findings. Musculoskeletal: Spondylitic changes in the lower cervical spine. Vertebral endplate spurring at multiple levels in the mid and lower thoracic spine. Review of the MIP images confirms the above findings. IMPRESSION: 1. Negative for acute PE or thoracic aortic dissection. 2.  Cardiomegaly with left atrial enlargement. 3. Dilated central pulmonary arteries suggesting pulmonary hypertension. 4. Trace right pleural effusion. 5.  Aortic Atherosclerosis (ICD10-I70.0). Electronically Signed   By: Corlis Leak M.D.   On: 07/09/2023 12:12   DG Chest 2 View  Result Date: 07/09/2023 CLINICAL DATA:  Dyspnea, CHF EXAM: CHEST - 2 VIEW COMPARISON:  04/22/2022 chest radiograph. FINDINGS: Stable cardiomediastinal silhouette with mild to moderate cardiomegaly. No pneumothorax. No pleural effusion. Mild pulmonary edema. No acute consolidative airspace disease. IMPRESSION: Mild congestive heart failure. Electronically Signed   By: Delbert Phenix M.D.   On: 07/09/2023 08:32    EKG: Independently reviewed.  Atrial flutter with rate 101; nonspecific ST changes with no evidence of acute ischemia. RBBB   Labs on Admission: I have personally reviewed the available labs and imaging studies at the time of the admission.  Pertinent labs:    BNP: 1638.5,  troponin 38>40,  d-dimer: .78,  BUN: 24,  creatinine: 1.32,   Assessment and Plan: Principal Problem:   Acute on chronic combined systolic and diastolic CHF (congestive heart failure) (HCC) Active Problems:   Atrial flutter (HCC)   Elevated troponin   Essential hypertension   Hypertrophic nonobstructive cardiomyopathy (HCC)   Non Obstructive CAD by cath 06/2009   Gout    Assessment and Plan: * Acute on chronic combined systolic and diastolic CHF (congestive heart  failure) (HCC) 67 year old male presenting to ED with acute onset of shortness of breath for one day that progressed as well as orthopnea found to be in acute on chronic combined CHF exacerbation with elevated bnp >1500, CXR showing pulmonary edema and symptomatic SOB.  -obs to tele -known nonischemic cardiomyopathy with an original ejection fraction of 25% to 50% on followup in 2012 and 2014, but echo in 2019 showed improvement of EF to 50-55%. Most recent echo in 12/23: EF 50-55%. The LV has global hypokinesis. There is severe asymmetric left  ventricular hypertrophy of the septal segment. Grade 1 DD.  -will  repeat echo in case of DCCV  -likely driven into CHF by newly diagnosed atrial fib/flutter  -given one dose of lasix in ED, cardiology giving 40mg  tonight -continue coreg, farxiga, entresto -did not tolerate aldactone due to gynecomastia  -strict I/O and daily weights     Atrial flutter (HCC) Newly diagnosed today and reasonably well controlled rate  Symptomatic and likely causing CHF exacerbation  CHA2DS2-VASc score of 3 Heparin gtt started will need to transition to DOAC -SW consult  Check TSH and magnesium  On coreg 50mg  BID  Repeat echo Conversion per cardiology    Elevated troponin Troponin flat 38>40, not indicative of ACS Likely demand ischemia in setting of new atrial flutter and acute on chronic CHF exacerbation  On telelmetry  Repeat echo pending   Essential hypertension Has not had medication today Given lasix in ED Start back coreg 50mg  BID Entresto 97-103mg  IV parameters   Hypertrophic nonobstructive cardiomyopathy (HCC) Cardiac MRI demonstrated extensive mid wall gadolinium enhancement with septal wall thickening all supporting the diagnosis of hypertrophic cardiomyopathy.  -genetic testing not completed due to insurance issues at the time  -echo 7/24: ejection fraction was 50 to 55% with severe asymmetric left ventricular hypertrophy  -had 18 beat run of  VT and met with EP about ICD  -per cardiology note in July no longer considering ICD, he confirms this today   Non Obstructive CAD by cath 06/2009 -prox to mid LAD 20% in 06/2009 -CT coronary angiogram with normal coronaries and zero calcium score in 2019  Gout Not taking allopurinol as prescribed     Advance Care Planning:   Code Status: Full Code   Consults: cardiology: Dr. Jenene Slicker   DVT Prophylaxis: heparin gtt, SW  Family Communication: none   Severity of Illness: The appropriate patient status for this patient is OBSERVATION. Observation status is judged to be reasonable and necessary in order to provide the required intensity of service to ensure the patient's safety. The patient's presenting symptoms, physical exam findings, and initial radiographic and laboratory data in the context of their medical condition is felt to place them at decreased risk for further clinical deterioration. Furthermore, it is anticipated that the patient will be medically stable for discharge from the hospital within 2 midnights of admission.   Author: Orland Mustard, MD 07/09/2023 7:18 PM  For on call review [email or web address].

## 2023-07-09 NOTE — Progress Notes (Signed)
ANTICOAGULATION CONSULT NOTE  Pharmacy Consult for heparin Indication: atrial fibrillation  Allergies  Allergen Reactions   Spironolactone Other (See Comments)    Breast tenderness    Patient Measurements: Weight: 100.9 kg (222 lb 7.1 oz) Heparin Dosing Weight: 96kg  Vital Signs: Temp: 98.8 F (37.1 C) (08/10 1650) Temp Source: Oral (08/10 1650) BP: 159/113 (08/10 1650) Pulse Rate: 61 (08/10 1650)  Labs: Recent Labs    07/09/23 0902 07/09/23 0903 07/09/23 1019 07/09/23 1940  HGB 13.9  --   --   --   HCT 44.0  --   --   --   PLT 154  --   --   --   HEPARINUNFRC  --   --   --  0.38  CREATININE  --   --  1.32*  --   TROPONINIHS  --  38* 40*  --     Estimated Creatinine Clearance: 64.8 mL/min (A) (by C-G formula based on SCr of 1.32 mg/dL (H)).  Assessment: 89 YOM presenting with SOB, new onset aflutter, he is not on anticoagulation PTA, CBC wnl.  Heparin level is therapeutic at 0.38 on 1350 units/hr. No bleeding noted.   Goal of Therapy:  Heparin level 0.3-0.7 units/ml Monitor platelets by anticoagulation protocol: Yes   Plan:  Continue heparin gtt at 1350 units/hr Confirmatory heparin level with am labs F/u long term AC plan  Thank you for involving pharmacy in this patient's care.  Loura Back, PharmD, BCPS Clinical Pharmacist Clinical phone for 07/09/2023 is [phone] 07/09/2023 9:13 PM

## 2023-07-09 NOTE — Assessment & Plan Note (Addendum)
No chest pain, continue blood pressure control.

## 2023-07-09 NOTE — ED Notes (Signed)
Attempted to call report; this RN contact info provided for callback 

## 2023-07-09 NOTE — Assessment & Plan Note (Deleted)
Cardiac MRI demonstrated extensive mid wall gadolinium enhancement with septal wall thickening all supporting the diagnosis of hypertrophic cardiomyopathy.  -genetic testing not completed due to insurance issues at the time  -echo 7/24: ejection fraction was 50 to 55% with severe asymmetric left ventricular hypertrophy  -had 18 beat run of VT and met with EP about ICD  -per cardiology note in July no longer considering ICD, he confirms this today

## 2023-07-09 NOTE — Progress Notes (Signed)
ANTICOAGULATION CONSULT NOTE - Initial Consult  Pharmacy Consult for heparin Indication: atrial fibrillation  Allergies  Allergen Reactions   Spironolactone Other (See Comments)    Breast tenderness    Patient Measurements: Weight: 100.9 kg (222 lb 7.1 oz) Heparin Dosing Weight: 96kg  Vital Signs: Temp: 98 F (36.7 C) (08/10 0811) BP: 168/129 (08/10 1000) Pulse Rate: 109 (08/10 1000)  Labs: Recent Labs    07/09/23 0902 07/09/23 0903 07/09/23 1019  HGB 13.9  --   --   HCT 44.0  --   --   PLT 154  --   --   CREATININE  --   --  1.32*  TROPONINIHS  --  38* 40*    Estimated Creatinine Clearance: 64.8 mL/min (A) (by C-G formula based on SCr of 1.32 mg/dL (H)).   Medical History: Past Medical History:  Diagnosis Date   Arthritis    CAD (coronary artery disease)    NONOBSTRUCTIVE  a. cath 8/10: prox to mid LAD 20%   Chronic systolic heart failure (HCC)    Essential hypertension, benign    Family history of breast cancer    Family history of prostate cancer    Gout    Gout    Hypertrophic nonobstructive cardiomyopathy (HCC)    septal hypertrophy without LVOT   NICM (nonischemic cardiomyopathy) (HCC)    a. echo 8/10: EF20-25%, mod LAE, severe asymmetric septal hypertrophy (? nonobs. HCM); grade 1 diast dyfxn.  EF 55% echo June 6    Assessment: 75 YOM presenting with SOB, new onset aflutter, he is not on anticoagulation PTA, CBC wnl  Goal of Therapy:  Heparin level 0.3-0.7 units/ml Monitor platelets by anticoagulation protocol: Yes   Plan:  Heparin 5500 units IV x 1, and gtt at 1350 units/hr F/u 6 hour heparin level F/u long term [facility] plan  Daylene Posey, PharmD, [facility] Clinical Pharmacist ED Pharmacist Phone # [phone] 07/09/2023 1:07 PM

## 2023-07-09 NOTE — ED Triage Notes (Signed)
Shortness of breath when he went to bed , was unable to sleep due to SOB .  Denies chest pain . Hx CHF

## 2023-07-10 ENCOUNTER — Observation Stay (HOSPITAL_COMMUNITY): Payer: Medicare Other

## 2023-07-10 DIAGNOSIS — I77810 Thoracic aortic ectasia: Secondary | ICD-10-CM | POA: Diagnosis present

## 2023-07-10 DIAGNOSIS — I421 Obstructive hypertrophic cardiomyopathy: Secondary | ICD-10-CM | POA: Diagnosis not present

## 2023-07-10 DIAGNOSIS — Z8249 Family history of ischemic heart disease and other diseases of the circulatory system: Secondary | ICD-10-CM | POA: Diagnosis not present

## 2023-07-10 DIAGNOSIS — I5043 Acute on chronic combined systolic (congestive) and diastolic (congestive) heart failure: Secondary | ICD-10-CM | POA: Diagnosis present

## 2023-07-10 DIAGNOSIS — N179 Acute kidney failure, unspecified: Secondary | ICD-10-CM | POA: Diagnosis present

## 2023-07-10 DIAGNOSIS — E876 Hypokalemia: Secondary | ICD-10-CM | POA: Diagnosis not present

## 2023-07-10 DIAGNOSIS — I4891 Unspecified atrial fibrillation: Secondary | ICD-10-CM | POA: Diagnosis present

## 2023-07-10 DIAGNOSIS — I422 Other hypertrophic cardiomyopathy: Secondary | ICD-10-CM | POA: Diagnosis present

## 2023-07-10 DIAGNOSIS — T501X5A Adverse effect of loop [high-ceiling] diuretics, initial encounter: Secondary | ICD-10-CM | POA: Diagnosis not present

## 2023-07-10 DIAGNOSIS — I4892 Unspecified atrial flutter: Secondary | ICD-10-CM

## 2023-07-10 DIAGNOSIS — I5033 Acute on chronic diastolic (congestive) heart failure: Secondary | ICD-10-CM

## 2023-07-10 DIAGNOSIS — M109 Gout, unspecified: Secondary | ICD-10-CM | POA: Diagnosis present

## 2023-07-10 DIAGNOSIS — Z8042 Family history of malignant neoplasm of prostate: Secondary | ICD-10-CM | POA: Diagnosis not present

## 2023-07-10 DIAGNOSIS — I255 Ischemic cardiomyopathy: Secondary | ICD-10-CM | POA: Diagnosis present

## 2023-07-10 DIAGNOSIS — I272 Pulmonary hypertension, unspecified: Secondary | ICD-10-CM | POA: Diagnosis present

## 2023-07-10 DIAGNOSIS — I509 Heart failure, unspecified: Secondary | ICD-10-CM | POA: Insufficient documentation

## 2023-07-10 DIAGNOSIS — Z79899 Other long term (current) drug therapy: Secondary | ICD-10-CM | POA: Diagnosis not present

## 2023-07-10 DIAGNOSIS — Z841 Family history of disorders of kidney and ureter: Secondary | ICD-10-CM | POA: Diagnosis not present

## 2023-07-10 DIAGNOSIS — I2583 Coronary atherosclerosis due to lipid rich plaque: Secondary | ICD-10-CM

## 2023-07-10 DIAGNOSIS — I2510 Atherosclerotic heart disease of native coronary artery without angina pectoris: Secondary | ICD-10-CM

## 2023-07-10 DIAGNOSIS — I10 Essential (primary) hypertension: Secondary | ICD-10-CM

## 2023-07-10 DIAGNOSIS — Z803 Family history of malignant neoplasm of breast: Secondary | ICD-10-CM | POA: Diagnosis not present

## 2023-07-10 DIAGNOSIS — I2489 Other forms of acute ischemic heart disease: Secondary | ICD-10-CM | POA: Diagnosis present

## 2023-07-10 DIAGNOSIS — I440 Atrioventricular block, first degree: Secondary | ICD-10-CM | POA: Diagnosis present

## 2023-07-10 DIAGNOSIS — I452 Bifascicular block: Secondary | ICD-10-CM | POA: Diagnosis present

## 2023-07-10 DIAGNOSIS — I110 Hypertensive heart disease with heart failure: Secondary | ICD-10-CM | POA: Diagnosis present

## 2023-07-10 DIAGNOSIS — I5022 Chronic systolic (congestive) heart failure: Secondary | ICD-10-CM

## 2023-07-10 DIAGNOSIS — Z7984 Long term (current) use of oral hypoglycemic drugs: Secondary | ICD-10-CM | POA: Diagnosis not present

## 2023-07-10 DIAGNOSIS — Z833 Family history of diabetes mellitus: Secondary | ICD-10-CM | POA: Diagnosis not present

## 2023-07-10 DIAGNOSIS — R0602 Shortness of breath: Secondary | ICD-10-CM | POA: Diagnosis present

## 2023-07-10 DIAGNOSIS — Z7901 Long term (current) use of anticoagulants: Secondary | ICD-10-CM | POA: Diagnosis not present

## 2023-07-10 LAB — CBC WITH DIFFERENTIAL/PLATELET
Abs Immature Granulocytes: 0.02 10*3/uL (ref 0.00–0.07)
Basophils Absolute: 0 10*3/uL (ref 0.0–0.1)
Basophils Relative: 1 %
Eosinophils Absolute: 0.1 10*3/uL (ref 0.0–0.5)
Eosinophils Relative: 1 %
HCT: 44 % (ref 39.0–52.0)
Hemoglobin: 13.9 g/dL (ref 13.0–17.0)
Immature Granulocytes: 0 %
Lymphocytes Relative: 14 %
Lymphs Abs: 1.2 10*3/uL (ref 0.7–4.0)
MCH: 22.7 pg — ABNORMAL LOW (ref 26.0–34.0)
MCHC: 31.6 g/dL (ref 30.0–36.0)
MCV: 71.8 fL — ABNORMAL LOW (ref 80.0–100.0)
Monocytes Absolute: 0.6 10*3/uL (ref 0.1–1.0)
Monocytes Relative: 7 %
Neutro Abs: 6.9 10*3/uL (ref 1.7–7.7)
Neutrophils Relative %: 77 %
Platelets: 154 10*3/uL (ref 150–400)
RBC: 6.13 MIL/uL — ABNORMAL HIGH (ref 4.22–5.81)
RDW: 18.8 % — ABNORMAL HIGH (ref 11.5–15.5)
WBC: 8.8 10*3/uL (ref 4.0–10.5)
nRBC: 0 % (ref 0.0–0.2)

## 2023-07-10 LAB — CBC
HCT: 46.1 % (ref 39.0–52.0)
Hemoglobin: 14.6 g/dL (ref 13.0–17.0)
MCH: 22.8 pg — ABNORMAL LOW (ref 26.0–34.0)
MCHC: 31.7 g/dL (ref 30.0–36.0)
MCV: 72.1 fL — ABNORMAL LOW (ref 80.0–100.0)
Platelets: 163 10*3/uL (ref 150–400)
RBC: 6.39 MIL/uL — ABNORMAL HIGH (ref 4.22–5.81)
RDW: 18.8 % — ABNORMAL HIGH (ref 11.5–15.5)
WBC: 7.8 10*3/uL (ref 4.0–10.5)
nRBC: 0 % (ref 0.0–0.2)

## 2023-07-10 LAB — BASIC METABOLIC PANEL WITH GFR
Anion gap: 13 (ref 5–15)
BUN: 18 mg/dL (ref 8–23)
CO2: 23 mmol/L (ref 22–32)
Calcium: 9 mg/dL (ref 8.9–10.3)
Chloride: 103 mmol/L (ref 98–111)
Creatinine, Ser: 1.31 mg/dL — ABNORMAL HIGH (ref 0.61–1.24)
GFR, Estimated: 59 mL/min — ABNORMAL LOW (ref >60)
Glucose, Bld: 81 mg/dL (ref 70–99)
Potassium: 3.3 mmol/L — ABNORMAL LOW (ref 3.5–5.1)
Sodium: 139 mmol/L (ref 135–145)

## 2023-07-10 LAB — ECHOCARDIOGRAM COMPLETE
AR max vel: 3.63 cm2
AV Peak grad: 4.7 mmHg
Ao pk vel: 1.09 m/s
Area-P 1/2: 4.26 cm2
S' Lateral: 3.3 cm
Weight: 3240 [oz_av]

## 2023-07-10 LAB — HEPARIN LEVEL (UNFRACTIONATED): Heparin Unfractionated: 0.31 [IU]/mL (ref 0.30–0.70)

## 2023-07-10 MED ORDER — CARVEDILOL 25 MG PO TABS
25.0000 mg | ORAL_TABLET | Freq: Two times a day (BID) | ORAL | Status: DC
  Administered 2023-07-10 – 2023-07-13 (×7): 25 mg via ORAL
  Filled 2023-07-10 (×7): qty 1

## 2023-07-10 MED ORDER — FUROSEMIDE 10 MG/ML IJ SOLN
40.0000 mg | Freq: Two times a day (BID) | INTRAMUSCULAR | Status: DC
  Administered 2023-07-10 – 2023-07-11 (×2): 40 mg via INTRAVENOUS
  Filled 2023-07-10 (×2): qty 4

## 2023-07-10 MED ORDER — POTASSIUM CHLORIDE CRYS ER 20 MEQ PO TBCR
40.0000 meq | EXTENDED_RELEASE_TABLET | ORAL | Status: AC
  Administered 2023-07-10 (×2): 40 meq via ORAL
  Filled 2023-07-10 (×2): qty 2

## 2023-07-10 MED ORDER — FUROSEMIDE 10 MG/ML IJ SOLN
40.0000 mg | Freq: Once | INTRAMUSCULAR | Status: AC
  Administered 2023-07-10: 40 mg via INTRAVENOUS
  Filled 2023-07-10: qty 4

## 2023-07-10 MED ORDER — TRAZODONE HCL 50 MG PO TABS
50.0000 mg | ORAL_TABLET | Freq: Every day | ORAL | Status: DC
  Administered 2023-07-10: 50 mg via ORAL
  Filled 2023-07-10: qty 1

## 2023-07-10 MED ORDER — APIXABAN 5 MG PO TABS
5.0000 mg | ORAL_TABLET | Freq: Two times a day (BID) | ORAL | Status: DC
  Administered 2023-07-10 – 2023-07-13 (×7): 5 mg via ORAL
  Filled 2023-07-10 (×7): qty 1

## 2023-07-10 NOTE — Assessment & Plan Note (Signed)
Hypokalemia  Volume status has improved, renal function today with serum cr at 1,53 with K at 3,8 and serum bicarbonate at 21. Na 138 Mg 1,8   Add 40 meq Kcl and 2 g Mag sulfate today Target K of 4 and Mg 2.

## 2023-07-10 NOTE — Plan of Care (Signed)
  Problem: Education: Goal: Knowledge of General Education information will improve Description: Including pain rating scale, medication(s)/side effects and non-pharmacologic comfort measures Outcome: Progressing   Problem: Clinical Measurements: Goal: Ability to maintain clinical measurements within normal limits will improve Outcome: Progressing Goal: Will remain free from infection Outcome: Progressing Goal: Diagnostic test results will improve Outcome: Progressing Goal: Respiratory complications will improve Outcome: Progressing Goal: Cardiovascular complication will be avoided Outcome: Progressing   Problem: Activity: Goal: Risk for activity intolerance will decrease Outcome: Progressing   Problem: Nutrition: Goal: Adequate nutrition will be maintained Outcome: Progressing   Problem: Coping: Goal: Level of anxiety will decrease Outcome: Progressing  Problem: Elimination: Goal: Will not experience complications related to bowel motility Outcome: Progressing Goal: Will not experience complications related to urinary retention Outcome: Progressing   Problem: Pain Managment: Goal: General experience of comfort will improve Outcome: Progressing   Problem: Safety: Goal: Ability to remain free from injury will improve Outcome: Progressing   Problem: Skin Integrity: Goal: Risk for impaired skin integrity will decrease Outcome: Progressing   Problem: Education: Goal: Knowledge of disease or condition will improve Outcome: Progressing Goal: Understanding of medication regimen will improve Outcome: Progressing Goal: Individualized Educational Video(s) Outcome: Progressing   Problem: Activity: Goal: Ability to tolerate increased activity will improve Outcome: Progressing

## 2023-07-10 NOTE — Progress Notes (Signed)
Progress Note  Patient Name: Craig Prince Date of Encounter: 07/10/2023 Primary Cardiologist: Rollene Rotunda, MD   Subjective   Overnight notes improvement in shortness of breath without resolution or orthopnea or cough.  Vital Signs    Vitals:   07/09/23 1914 07/09/23 2014 07/09/23 2350 07/10/23 0416  BP: (!) 150/111 (!) 149/100 (!) 145/117 139/88  Pulse: 96 99 (!) 106 (!) 124  Resp: 19 19 18 16   Temp:  98.8 F (37.1 C) 98.5 F (36.9 C) 98.2 F (36.8 C)  TempSrc:   Oral Oral  SpO2: 98% 97% 98% 99%  Weight:    91.9 kg    Intake/Output Summary (Last 24 hours) at 07/10/2023 0655 Last data filed at 07/10/2023 0412 Gross per 24 hour  Intake 724.83 ml  Output 2980 ml  Net -2255.17 ml   Filed Weights   07/09/23 0811 07/10/23 0416  Weight: 100.9 kg 91.9 kg    Physical Exam   GEN: No acute distress.   Neck: No JVD Cardiac: iRRR, systolic murmurs, no rubs, or gallops.  Respiratory: Clear to auscultation bilaterally. GI: Soft, nontender, non-distended  MS: No edema  Labs   Telemetry: AFL, VT listed on monitor is AFL   Chemistry Recent Labs  Lab 07/09/23 1019 07/10/23 0125  NA 140 139  K 3.9 3.3*  CL 108 103  CO2 22 23  GLUCOSE 102* 81  BUN 24* 18  CREATININE 1.32* 1.31*  CALCIUM 9.0 9.0  PROT 7.0  --   ALBUMIN 4.0  --   AST 34  --   ALT 39  --   ALKPHOS 43  --   BILITOT 1.2  --   GFRNONAA 59* 59*  ANIONGAP 10 13     Hematology Recent Labs  Lab 07/09/23 0902 07/10/23 0125  WBC 8.8 7.8  RBC 6.13* 6.39*  HGB 13.9 14.6  HCT 44.0 46.1  MCV 71.8* 72.1*  MCH 22.7* 22.8*  MCHC 31.6 31.7  RDW 18.8* 18.8*  PLT 154 163    Cardiac EnzymesNo results for input(s): "TROPONINI" in the last 168 hours. No results for input(s): "TROPIPOC" in the last 168 hours.   BNP Recent Labs  Lab 07/09/23 0832  BNP 1,638.5*     DDimer  Recent Labs  Lab 07/09/23 0903  DDIMER 0.78*     Cardiac Studies   Cardiac Studies & Procedures        ECHOCARDIOGRAM  ECHOCARDIOGRAM COMPLETE 11/03/2022  Narrative ECHOCARDIOGRAM REPORT    Patient Name:   Craig Prince Date of Exam: 11/03/2022 Medical Rec #:  [id number]     Height:       70.0 in Accession #:    [id number]    Weight:       200.0 lb Date of Birth:  05-04-1956     BSA:          2.087 m Patient Age:    66 years      BP:           130/82 mmHg Patient Gender: M             HR:           61 bpm. Exam Location:  [address]  Procedure: 2D Echo, 3D Echo, Cardiac Doppler, Color Doppler and Strain Analysis  Indications:    I50.21 CHF  History:        Patient has prior history of Echocardiogram examinations, most recent 09/12/2018. CHF, CAD; Risk Factors:Hypertension, Dyslipidemia and Family History  of Coronary Artery Disease. Non-Ischemic Cardiomyopathy, prior EF 50-55%, Aortic Root enlargement.  Sonographer:    Farrel Conners RDCS Referring Phys: Marylene Land NICOLE DUKE  IMPRESSIONS   1. Severe asymmetric LVH of the septum up to 22 mm consistent with known history of hypertrophic cardiomyopathy. No LVOT obstruction. Left ventricular ejection fraction, by estimation, is 50 to 55%. The left ventricle has low normal function. The left ventricle demonstrates global hypokinesis. There is severe asymmetric left ventricular hypertrophy of the septal segment. Left ventricular diastolic parameters are consistent with Grade I diastolic dysfunction (impaired relaxation). The average left ventricular global longitudinal strain is -12.6 %. The global longitudinal strain is abnormal. 2. Right ventricular systolic function is normal. The right ventricular size is normal. There is normal pulmonary artery systolic pressure. The estimated right ventricular systolic pressure is 30.1 mmHg. 3. Left atrial size was severely dilated. 4. Right atrial size was mild to moderately dilated. 5. The mitral valve is grossly normal. No evidence of mitral valve regurgitation. No evidence of mitral  stenosis. 6. The aortic valve is tricuspid. Aortic valve regurgitation is not visualized. No aortic stenosis is present. 7. Aortic dilatation noted. There is mild dilatation of the aortic root, measuring 40 mm. There is moderate dilatation of the ascending aorta, measuring 42 mm. 8. The inferior vena cava is dilated in size with >50% respiratory variability, suggesting right atrial pressure of 8 mmHg.  Comparison(s): No significant change from prior study.    FINDINGS Left Ventricle: Severe asymmetric LVH of the septum up to 22 mm consistent with known history of hypertrophic cardiomyopathy. No LVOT obstruction. Left ventricular ejection fraction, by estimation, is 50 to 55%. The left ventricle has low normal function. The left ventricle demonstrates global hypokinesis. The average left ventricular global longitudinal strain is -12.6 %. The global longitudinal strain is abnormal. The left ventricular internal cavity size was normal in size. There is severe asymmetric left ventricular hypertrophy of the septal segment. Left ventricular diastolic parameters are consistent with Grade I diastolic dysfunction (impaired relaxation).  Right Ventricle: The right ventricular size is normal. No increase in right ventricular wall thickness. Right ventricular systolic function is normal. There is normal pulmonary artery systolic pressure. The tricuspid regurgitant velocity is 2.35 m/s, and with an assumed right atrial pressure of 8 mmHg, the estimated right ventricular systolic pressure is 30.1 mmHg.  Left Atrium: Left atrial size was severely dilated.  Right Atrium: Right atrial size was mild to moderately dilated.  Pericardium: There is no evidence of pericardial effusion.  Mitral Valve: The mitral valve is grossly normal. No evidence of mitral valve regurgitation. No evidence of mitral valve stenosis.  Tricuspid Valve: The tricuspid valve is grossly normal. Tricuspid valve regurgitation is mild . No  evidence of tricuspid stenosis.  Aortic Valve: The aortic valve is tricuspid. Aortic valve regurgitation is not visualized. No aortic stenosis is present.  Pulmonic Valve: The pulmonic valve was grossly normal. Pulmonic valve regurgitation is trivial. No evidence of pulmonic stenosis.  Aorta: Aortic dilatation noted. There is mild dilatation of the aortic root, measuring 40 mm. There is moderate dilatation of the ascending aorta, measuring 42 mm.  Venous: The inferior vena cava is dilated in size with greater than 50% respiratory variability, suggesting right atrial pressure of 8 mmHg.  IAS/Shunts: The atrial septum is grossly normal.   LEFT VENTRICLE PLAX 2D LVIDd:         6.40 cm   Diastology LVIDs:         4.15 cm  LV e' medial:    4.03 cm/s LV PW:         1.10 cm   LV E/e' medial:  9.8 LV IVS:        2.30 cm   LV e' lateral:   4.90 cm/s LVOT diam:     2.30 cm   LV E/e' lateral: 8.0 LV SV:         69 LV SV Index:   33        2D Longitudinal Strain LVOT Area:     4.15 cm  2D Strain GLS (A2C):   -13.2 % 2D Strain GLS (A3C):   -9.2 % 2D Strain GLS (A4C):   -15.5 % 2D Strain GLS Avg:     -12.6 %  3D Volume EF: 3D EF:        58 % LV EDV:       239 ml LV ESV:       100 ml LV SV:        139 ml  RIGHT VENTRICLE RV Basal diam:  4.80 cm RV Mid diam:    4.10 cm RV S prime:     15.80 cm/s TAPSE (M-mode): 2.8 cm  LEFT ATRIUM              Index        RIGHT ATRIUM           Index LA diam:        5.40 cm  2.59 cm/m   RA Area:     29.20 cm LA Vol (A2C):   102.0 ml 48.87 ml/m  RA Volume:   93.80 ml  44.94 ml/m LA Vol (A4C):   143.0 ml 68.51 ml/m LA Biplane Vol: 124.0 ml 59.41 ml/m AORTIC VALVE LVOT Vmax:   80.00 cm/s LVOT Vmean:  49.300 cm/s LVOT VTI:    0.167 m  AORTA Ao Root diam: 4.00 cm Ao Asc diam:  4.20 cm  MITRAL VALVE               TRICUSPID VALVE MV Area (PHT)  cm         TR Peak grad:   22.1 mmHg MV Decel Time: 243 msec    TR Vmax:        235.00 cm/s MV E  velocity: 39.40 cm/s MV A velocity: 48.40 cm/s  SHUNTS MV E/A ratio:  0.81        Systemic VTI:  0.17 m Systemic Diam: 2.30 cm  Lennie Odor MD Electronically signed by Lennie Odor MD Signature Date/Time: 11/03/2022/11:38:27 AM    Final    MONITORS  LONG TERM MONITOR (3-14 DAYS) 01/20/2022  Narrative  Had a min HR of 46 bpm, max HR of 179 bpm, and avg HR of 70 bpm. Predominant underlying rhythm was Sinus Rhythm. First Degree AV Block was present. Bundle Branch Block/IVCD was present. 1 run of Ventricular Tachycardia occurred lasting 18 beats with a max rate of 171 bpm (avg 145 bpm). 3 Supraventricular Tachycardia runs occurred, the run with the fastest interval lasting 4 beats with a max rate of 179 bpm, the longest lasting 9 beats with an avg rate of 121 bpm. Isolated SVEs were rare (<1.0%), SVE Couplets were rare (<1.0%), and SVE Triplets were rare (<1.0%). Isolated VEs were occasional (2.8%, 9747), VE Couplets were rare (<1.0%, 136), and no VE Triplets were present. Ventricular Bigeminy and Trigeminy were present.  Normal sinus rhythm NSVT lasting 18 beats. Three runs of SVT with  the longest being 9 beats.   CT SCANS  CT CORONARY MORPH W/CTA COR W/SCORE 12/29/2016  Addendum 12/29/2016  3:19 PM ADDENDUM REPORT: 12/29/2016 15:17  CLINICAL DATA:  Chest pain  EXAM: Cardiac CTA  MEDICATIONS: Sub lingual nitro. 4mg  and lopressor 5mg   TECHNIQUE: The patient was scanned on a Philips 256 slice scanner. Gantry rotation speed was 270 msecs. Collimation was .9mm. A 100 kV prospective scan was triggered in the descending thoracic aorta at 111 HU's with 5% padding centered around 78% of the R-R interval. Average HR during the scan was 64 bpm. The 3D data set was interpreted on a dedicated work station using MPR, MIP and VRT modes. A total of 80cc of contrast was used.  FINDINGS: Non-cardiac: See separate report from [facility]. No significant findings on limited lung  and soft tissue windows.  Calcium Score:  0  Coronary Arteries: Right dominant with no anomalies  LM:  Normal  LAD:  Normal  D1:  Normal  D2: Normal  D3:  Normal  Circumflex:  Normal dominant  OM1:  Normal  OM2:  Normal  RCA:  Normal  PDA:  Left sided normal  PLA:  Left sided normal  IMPRESSION: 1) Normal left dominant coronary arteries  2) Calcium score 0  Charlton Haws   Electronically Signed By: Charlton Haws M.D. On: 12/29/2016 15:17  Narrative EXAM: OVER-READ INTERPRETATION  CT CHEST  The following report is an over-read performed by radiologist Dr. Noe Gens [facility] on 12/29/2016. This over-read does not include interpretation of cardiac or coronary anatomy or pathology. The coronary CTA interpretation by the cardiologist is attached.  COMPARISON:  None.  FINDINGS: Cardiovascular: Aorta is tortuous, measuring 3.0 cm at the aortic valve, 4.3 cm at the sinus of Valsalva, 3.3 cm at the sino-tubular junction, and 3.9 cm maximally in the ascending thoracic aorta. Heart is borderline in size.  Mediastinum/Nodes: No adenopathy in the visualized lower mediastinum or hila.  Lungs/Pleura: Visualized lungs are clear.  No effusions.  Upper Abdomen: Imaging into the upper abdomen shows no acute findings.  Musculoskeletal: Visualized chest wall soft tissues. No acute bony abnormality.  IMPRESSION: Ectatic ascending thoracic aorta, measuring 4.3 cm maximally in the sinus of Valsalva and 3.9 cm in the ascending thoracic aorta.  No acute extracardiac abnormality.  Electronically Signed: By: Charlett Nose M.D. On: 12/29/2016 11:39   CARDIAC MRI  MR CARDIAC MORPHOLOGY W WO CONTRAST 03/10/2022  Narrative CLINICAL DATA:  Clinical question of hypertrophic cardiomyopathy Study assumes BSA of 2.31 m2.  EXAM: CARDIAC MRI  TECHNIQUE: The patient was scanned on a 1.5 Tesla GE magnet. A dedicated cardiac coil was used. Functional  imaging was done using Fiesta sequences. 2,3, and 4 chamber views were done to assess for RWMA's. Modified Simpson's rule using a short axis stack was used to calculate an ejection fraction on a dedicated work [employer]. The patient received 13 cc of Gadavist. After 10 minutes inversion recovery sequences were used to assess for infiltration and scar tissue.  CONTRAST:  13 cc  of Gadavist  FINDINGS: 1. Mildly increased left ventricular size, with LVEDD 66 mm, and LVEDVi 102 mL/m2.  Severe asymmetric septal hypertrophy, with intraventricular septal thickness of 22 mm, posterior wall thickness of 7 mm, and myocardial mass index of 94 g/m2.  Mildly reduced left ventricular systolic function (LVEF =40%). There is septal hypokinesis. There is no systolic anterior motion of the mitral valve.  Global longitudinal strain -6%. Qualitatively  increased mechanical dispersion.  Left ventricular parametric mapping notable for normal native T1 and T2 signal.  There is late gadolinium enhancement in the left ventricular myocardium. There is patchy heterogeneous LGE in the septum (basal to apex) and inferior and anterior (mid to apex). Using a 6 standard deviation assessment, LGE quantification is 23.5% of total myocardium.  2. Normal right ventricular size with RVEDVI 79 mL/m2.  Normal right ventricular thickness.  Low normal right ventricular systolic function (RVEF =45%). There is septal hypokinesis.  3.  Normal left and right atrial size.  4.  Mild dilation of the ascending aorta: 42 mm.  Moderate dilation of the main pulmonary artery, with diameter of 35 mm but with trunk to aorta ratio of 0.8.  5. Valve assessment:  Aortic Valve: Qualitatively, there is no significant regurgitation.  Pulmonic Valve: Qualitatively, there is no significant regurgitation.  Tricuspid Valve: Qualitatively, there is no significant regurgitation.  Mitral Valve: Mild central  regurgitation.  6.  Normal pericardium.  No pericardial effusion.  7. Grossly, no extracardiac findings. Recommended dedicated study if concerned for non-cardiac pathology.  IMPRESSION: Study suggestive of hypertrophic cardiomyopathy. No LVOT obstructive demonstrated in this study.  LVEF 40%, with septal thickness of 22 mm and LGE quantification of 23.5%.  Compared to prior, LVEF is slightly decrease and septal thickness is slightly increased. LGE is extensive.  Riley Lam MD   Electronically Signed By: Riley Lam M.D. On: 03/10/2022 18:47              Assessment & Plan   AFL, new - CHADSVASC NA due to HCM - continue Coreg - given HCM and EF < 40%, recommend TEE/DCCV when euvolemic   HCM: - no LVOT gradient at last assessment - with prior NSVT, he deferred ICD, despite LVEF < 40%, NSVT, and LGE > 10% - discussed outpatient screening for his 7 children   Acute on Chronic HFrEF HTN NYHA IV, Stage C, hypervolemic - continue BB, SGLT, ARNI, he has allergy to aldactone, eplerenone as outpatient - lasix 40 IV BID   Mild aortic dilation - potential TEE this admission, mild at last eval   For questions or updates, please contact [facility] Please consult [email or web address] for contact info under Cardiology/STEMI.      Riley Lam, MD FASE [facility] Cardiologist [facility]  [address] [place], [address] [place], [place] [address] [phone]  6:55 AM

## 2023-07-10 NOTE — Discharge Instructions (Signed)
Information on my medicine - ELIQUIS (apixaban)  Why was Eliquis prescribed for you? Eliquis was prescribed for you to reduce the risk of a blood clot forming that can cause a stroke if you have a medical condition called atrial fibrillation (a type of irregular heartbeat).  What do You need to know about Eliquis ? Take your Eliquis TWICE DAILY - one tablet in the morning and one tablet in the evening with or without food. If you have difficulty swallowing the tablet whole please discuss with your pharmacist how to take the medication safely.  Take Eliquis exactly as prescribed by your doctor and DO NOT stop taking Eliquis without talking to the doctor who prescribed the medication.  Stopping may increase your risk of developing a stroke.  Refill your prescription before you run out.  After discharge, you should have regular check-up appointments with your healthcare provider that is prescribing your Eliquis.  In the future your dose may need to be changed if your kidney function or weight changes by a significant amount or as you get older.  What do you do if you miss a dose? If you miss a dose, take it as soon as you remember on the same day and resume taking twice daily.  Do not take more than one dose of ELIQUIS at the same time to make up a missed dose.  Important Safety Information A possible side effect of Eliquis is bleeding. You should call your healthcare provider right away if you experience any of the following: Bleeding from an injury or your nose that does not stop. Unusual colored urine (red or dark brown) or unusual colored stools (red or black). Unusual bruising for unknown reasons. A serious fall or if you hit your head (even if there is no bleeding).  Some medicines may interact with Eliquis and might increase your risk of bleeding or clotting while on Eliquis. To help avoid this, consult your healthcare provider or pharmacist prior to using any new prescription or  non-prescription medications, including herbals, vitamins, non-steroidal anti-inflammatory drugs (NSAIDs) and supplements.  This website has more information on Eliquis (apixaban): http://[email or web address].[email or web address]   Additional Discharge Instructions   Please get your medications reviewed and adjusted by your Primary MD.  Please request your Primary MD to go over all Hospital Tests and Procedure/Radiological results at the follow up, please get all Hospital records sent to your Prim MD by signing hospital release before you go home.  If you had Pneumonia of Lung problems at the Hospital: Please get a 2 view Chest X ray done in approximately 4 weeks after hospital discharge or sooner if instructed by your Primary MD.  If you have Congestive Heart Failure: Please call your Cardiologist or Primary MD anytime you have any of the following symptoms:  1) 3 pound weight gain in 24 hours or 5 pounds in 1 week  2) shortness of breath, with or without a dry hacking cough  3) swelling in the hands, feet or stomach  4) if you have to sleep on extra pillows at night in order to breathe  Follow cardiac low salt diet and 1.5 lit/day fluid restriction.  If you have diabetes Accuchecks 4 times/day, Once in AM empty stomach and then before each meal. Log in all results and show them to your primary doctor at your next visit. If any glucose reading is under 80 or above 300 call your primary MD immediately.  If you have Seizure/Convulsions/Epilepsy: Please do not drive, operate heavy machinery,   participate in activities at heights or participate in high speed sports until you have seen by Primary MD or a Neurologist and advised to do so again. Per [place] DMV statutes, patients with seizures are not allowed to drive until they have been seizure-free for six months.  Use caution when using heavy equipment or power tools. Avoid working on ladders or at heights. Take showers instead of baths.  Ensure the water temperature is not too high on the home water heater. Do not go swimming alone. Do not lock yourself in a room alone (i.e. bathroom). When caring for infants or small children, sit down when holding, feeding, or changing them to minimize risk of injury to the child in the event you have a seizure. Maintain good sleep hygiene. Avoid alcohol.   If you had Gastrointestinal Bleeding: Please ask your Primary MD to check a complete blood count within one week of discharge or at your next visit. Your endoscopic/colonoscopic biopsies that are pending at the time of discharge, will also need to followed by your Primary MD.  Get Medicines reviewed and adjusted. Please take all your medications with you for your next visit with your Primary MD  Please request your Primary MD to go over all hospital tests and procedure/radiological results at the follow up, please ask your Primary MD to get all Hospital records sent to his/her office.  If you experience worsening of your admission symptoms, develop shortness of breath, life threatening emergency, suicidal or homicidal thoughts you must seek medical attention immediately by calling 911 or calling your MD immediately  if symptoms less severe.  You must read complete instructions/literature along with all the possible adverse reactions/side effects for all the Medicines you take and that have been prescribed to you. Take any new Medicines after you have completely understood and accpet all the possible adverse reactions/side effects.   Do not drive or operate heavy machinery when taking Pain medications.   Do not take more than prescribed Pain, Sleep and Anxiety Medications  Special Instructions: If you have smoked or chewed Tobacco  in the last 2 yrs please stop smoking, stop any regular Alcohol  and or any Recreational drug use.  Wear Seat belts while driving.  Please note You were cared for by a hospitalist during your hospital stay. If  you have any questions about your discharge medications or the care you received while you were in the hospital after you are discharged, you can call the unit and asked to speak with the hospitalist on call if the hospitalist that took care of you is not available. Once you are discharged, your primary care physician will handle any further medical issues. Please note that NO REFILLS for any discharge medications will be authorized once you are discharged, as it is imperative that you return to your primary care physician (or establish a relationship with a primary care physician if you do not have one) for your aftercare needs so that they can reassess your need for medications and monitor your lab values.  You can reach the hospitalist office at phone [phone] or fax [phone]   If you do not have a primary care physician, you can call [phone] for a physician referral.  

## 2023-07-10 NOTE — Assessment & Plan Note (Signed)
Echocardiogram with preserved LV systolic function with EF 50 to 55%, severe LVH, RV systolic function preserved, LA with severe dilatation, RA with moderate dilatation, moderate dilatation of the ascending aorta 42 mm,  Hypertrophic cardiomyopathy with evidence of LVOT obstruction.   Urine output is 625 ml Systolic blood pressure 98 to 829  mmHg. .  Medical therapy with carvedilol, entresto and SGLT 2 inh.  Holding furosemide for now.

## 2023-07-10 NOTE — Progress Notes (Signed)
Progress Note   Patient: Craig Prince ION:[id number] DOB: 04/07/1956 DOA: 07/09/2023     0 DOS: the patient was seen and examined on 07/10/2023   Brief hospital course: Mr. Mallard was admitted to the hospital with the working diagnosis of heart failure in the setting of atrial fibrillation with RVR.   67 yo male with the past medical history of coronary artery disease, heart failure, hypertension, and gout who presented with dyspnea. Reported rapidly worsening dyspnea on exertion for about 24 hrs, prompting him to come to the ED. On his initial physical examination his blood pressure was 151/111, HR 88, RR 16 and 02 saturation 97%, lungs with no wheezing or rhonchi, heart with S1 and S2 present irregularly irregular with no gallops or rubs, abdomen with no distention and no lower extremity edema.   Na 140, K 3,9 Cl 108 bicarbonate 22, glucose 102 bun 24 cr 1,32 Mg 1,8  Wbc 8,8, hgb 13,9 plt 154  D dimer 0,78  High sensitive troponin 38 and 40  Urine analysis SG >1.030, protein 100, glucose 250, wbc 0-5, rbc 0-5. Small hgb.   Chest radiograph with mild cardiomegaly, bilateral hilar vascular congestion with cephalization of the vasculature, no effusions.  CT chest with no acute pulmonary embolism or thoracic aortic dissection.  Dilated central pulmonary arteries suggesting pulmonary hypertension.  Trace right pleural effusion.   EKG 101 bpm, left axis deviation, left bundle branch block, qtc not prolonged, atrial flutter with variable block, no significant ST segment or T wave changes.   Patient was placed on IV furosemide for diuresis and IV heparin for anticoagulation. Rate control with AV blockade.   Assessment and Plan: * Acute on chronic diastolic CHF (congestive heart failure) (HCC) Echocardiogram with preserved LV systolic function with EF 50 to 55%, severe LVH, RV systolic function preserved, LA with severe dilatation, RA with moderate dilatation, moderate dilatation of the  ascending aorta 42 mm,  Hypertrophic cardiomyopathy with evidence of LVOT obstruction.   Urine output is 2,980 ml Systolic blood pressure 136 to 91 mmHg.  Plan to continue diuresis with furosemide 40 mg IV bid.  Medical therapy with carvedilol, entresto and SGLT 2 inh.   Atrial flutter (HCC) Plan to continue rate control with carvedilol.  Anticoagulation with apixaban. Continue telemetry monitoring.  Will benefit from converting to sinus rhythm considering heart failure due to hypertrophic cardiomyopathy.   Essential hypertension Continue blood pressure monitoring. On carvedilol and entresto.   AKI (acute kidney injury) (HCC) Hypokalemia  Renal function with serum cr at 1,31 with K at 3,3 and serum bicarbonate at 23. Na 139 Mg, 1,8  Plan to continue diuresis with furosemide Add Kcl x2 doses of 40 meq and follow up renal function in am.   Non Obstructive CAD by cath 06/2009 No chest pain, continue blood pressure control.       Subjective: Dyspnea is improving, no chest pain or palpitations.   Physical Exam: Vitals:   07/10/23 0416 07/10/23 0700 07/10/23 0839 07/10/23 0843  BP: 139/88 (!) 136/95 (!) 136/95   Pulse: (!) 124  (!) 130   Resp: 16 16 14    Temp: 98.2 F (36.8 C) 98.7 F (37.1 C) 98.1 F (36.7 C)   TempSrc: Oral Oral Oral   SpO2: 99%  95% 98%  Weight: 91.9 kg      Neurology awake and alert ENT with no pallor Cardiovascular with S1 and S2 present irregular with no gallops, rubs or murmurs No JVD No lower extremity edema  Respiratory with mild rales at bases with no wheezing Abdomen with no distention  Data Reviewed:    Family Communication: no family at the bedside   Disposition: Status is: Observation The patient will require care spanning > 2 midnights and should be moved to inpatient because: Iv diuresis   Planned Discharge Destination: Home    Author: Coralie Keens, MD 07/10/2023 12:53 PM  For on call review [email or web address].

## 2023-07-10 NOTE — Hospital Course (Signed)
Mr. Aquilar was admitted to the hospital with the working diagnosis of heart failure in the setting of atrial fibrillation with RVR.   67 yo male with the past medical history of coronary artery disease, heart failure, hypertension, and gout who presented with dyspnea. Reported rapidly worsening dyspnea on exertion for about 24 hrs, prompting him to come to the ED. On his initial physical examination his blood pressure was 151/111, HR 88, RR 16 and 02 saturation 97%, lungs with no wheezing or rhonchi, heart with S1 and S2 present irregularly irregular with no gallops or rubs, abdomen with no distention and no lower extremity edema.   Na 140, K 3,9 Cl 108 bicarbonate 22, glucose 102 bun 24 cr 1,32 Mg 1,8  Wbc 8,8, hgb 13,9 plt 154  D dimer 0,78  High sensitive troponin 38 and 40  Urine analysis SG >1.030, protein 100, glucose 250, wbc 0-5, rbc 0-5. Small hgb.   Chest radiograph with mild cardiomegaly, bilateral hilar vascular congestion with cephalization of the vasculature, no effusions.  CT chest with no acute pulmonary embolism or thoracic aortic dissection.  Dilated central pulmonary arteries suggesting pulmonary hypertension.  Trace right pleural effusion.   EKG 101 bpm, left axis deviation, left bundle branch block, qtc not prolonged, atrial flutter with variable block, no significant ST segment or T wave changes.   Patient was placed on IV furosemide for diuresis and IV heparin for anticoagulation. Rate control with AV blockade.   08/12 this pm back into atrial fibrillation with RVR 140 bpm.  08/13 continue with atrial fibrillation with RVR, plan for direct current cardioversion tomorrow.

## 2023-07-10 NOTE — Progress Notes (Addendum)
Progress Note   Patient: Craig Prince WUJ:[id number] DOB: September 15, 1956 DOA: 07/09/2023     0 DOS: the patient was seen and examined on 07/10/2023   Brief hospital course: Craig Prince was admitted to the hospital with the working diagnosis of heart failure in the setting of atrial fibrillation with RVR.   67 yo male with the past medical history of coronary artery disease, heart failure, hypertension, and gout who presented with dyspnea. Reported rapidly worsening dyspnea on exertion for about 24 hrs, prompting him to come to the ED. On his initial physical examination his blood pressure was 151/111, HR 88, RR 16 and 02 saturation 97%, lungs with no wheezing or rhonchi, heart with S1 and S2 present irregularly irregular with no gallops or rubs, abdomen with no distention and no lower extremity edema.   Na 140, K 3,9 Cl 108 bicarbonate 22, glucose 102 bun 24 cr 1,32 Mg 1,8  Wbc 8,8, hgb 13,9 plt 154  D dimer 0,78  High sensitive troponin 38 and 40  Urine analysis SG >1.030, protein 100, glucose 250, wbc 0-5, rbc 0-5. Small hgb.   Chest radiograph with mild cardiomegaly, bilateral hilar vascular congestion with cephalization of the vasculature, no effusions.  CT chest with no acute pulmonary embolism or thoracic aortic dissection.  Dilated central pulmonary arteries suggesting pulmonary hypertension.  Trace right pleural effusion.   EKG 101 bpm, left axis deviation, left bundle branch block, qtc not prolonged, atrial flutter with variable block, no significant ST segment or T wave changes.   Patient was placed on IV furosemide for diuresis and IV heparin for anticoagulation. Rate control with AV blockade.   Assessment and Plan: * Acute on chronic diastolic CHF (congestive heart failure) (HCC) Echocardiogram with preserved LV systolic function with EF 50 to 55%, severe LVH, RV systolic function preserved, LA with severe dilatation, RA with moderate dilatation, moderate dilatation of the  ascending aorta 42 mm,  Hypertrophic cardiomyopathy with evidence of LVOT obstruction.   Urine output is 2,980 ml Systolic blood pressure 136 to 91 mmHg.  Plan to continue diuresis with furosemide 40 mg IV bid.  Medical therapy with carvedilol, entresto and SGLT 2 inh.   Atrial flutter (HCC) Plan to continue rate control with carvedilol.  Anticoagulation with apixaban. Continue telemetry monitoring.  Will benefit from converting to sinus rhythm considering heart failure due to hypertrophic cardiomyopathy.   Essential hypertension Continue blood pressure monitoring. On carvedilol and entresto.   AKI (acute kidney injury) (HCC) Hypokalemia  Renal function with serum cr at 1,31 with K at 3,3 and serum bicarbonate at 23. Na 139 Mg, 1,8  Plan to continue diuresis with furosemide Add Kcl x2 doses of 40 meq and follow up renal function in am.   Non Obstructive CAD by cath 06/2009 No chest pain, continue blood pressure control.     Subjective: Patient with improvement in dyspnea, but not back to baseline, no chest pain   Physical Exam: Vitals:   07/10/23 0416 07/10/23 0700 07/10/23 0839 07/10/23 0843  BP: 139/88 (!) 136/95 (!) 136/95   Pulse: (!) 124  (!) 130   Resp: 16 16 14    Temp: 98.2 F (36.8 C) 98.7 F (37.1 C) 98.1 F (36.7 C)   TempSrc: Oral Oral Oral   SpO2: 99%  95% 98%  Weight: 91.9 kg      Neurology awake and alert ENT with mild pallor Cardiovascular with S1 and S2 present, irregularly irregular with no gallops, rubs or murmurs Respiratory with  no rhonchi or wheezing, mild rales at bases bilaterally  Abdomen with no distention  No lower extremity edema  Data Reviewed:    Family Communication: no family at the bedside  Disposition: Status is: Inpatient Remains inpatient appropriate because: diuresis   Planned Discharge Destination: Home  Author: Coralie Keens, MD 07/10/2023 3:29 PM  For on call review [email or web address].

## 2023-07-10 NOTE — Progress Notes (Signed)
Echocardiogram 2D Echocardiogram has been performed.  Irish Lack, RDCS 07/10/2023, 11:45 AM

## 2023-07-11 ENCOUNTER — Other Ambulatory Visit (HOSPITAL_COMMUNITY): Payer: Self-pay

## 2023-07-11 DIAGNOSIS — I10 Essential (primary) hypertension: Secondary | ICD-10-CM | POA: Diagnosis not present

## 2023-07-11 DIAGNOSIS — I421 Obstructive hypertrophic cardiomyopathy: Secondary | ICD-10-CM

## 2023-07-11 DIAGNOSIS — I4892 Unspecified atrial flutter: Secondary | ICD-10-CM | POA: Diagnosis not present

## 2023-07-11 DIAGNOSIS — N179 Acute kidney failure, unspecified: Secondary | ICD-10-CM | POA: Diagnosis not present

## 2023-07-11 DIAGNOSIS — I5033 Acute on chronic diastolic (congestive) heart failure: Secondary | ICD-10-CM | POA: Diagnosis not present

## 2023-07-11 MED ORDER — TRAZODONE HCL 100 MG PO TABS
100.0000 mg | ORAL_TABLET | Freq: Every evening | ORAL | Status: DC | PRN
  Administered 2023-07-12 (×2): 100 mg via ORAL
  Filled 2023-07-11 (×2): qty 1

## 2023-07-11 MED ORDER — SODIUM CHLORIDE 0.9 % IV SOLN: INTRAVENOUS | Status: AC

## 2023-07-11 MED ORDER — POTASSIUM CHLORIDE CRYS ER 20 MEQ PO TBCR
40.0000 meq | EXTENDED_RELEASE_TABLET | Freq: Once | ORAL | Status: AC
  Administered 2023-07-11: 40 meq via ORAL
  Filled 2023-07-11: qty 2

## 2023-07-11 MED ORDER — METOPROLOL TARTRATE 5 MG/5ML IV SOLN
2.5000 mg | Freq: Once | INTRAVENOUS | Status: AC
  Administered 2023-07-11: 2.5 mg via INTRAVENOUS
  Filled 2023-07-11: qty 5

## 2023-07-11 MED ORDER — FUROSEMIDE 40 MG PO TABS
40.0000 mg | ORAL_TABLET | Freq: Every day | ORAL | Status: DC
  Filled 2023-07-11: qty 1

## 2023-07-11 MED ORDER — MAGNESIUM SULFATE 2 GM/50ML IV SOLN
2.0000 g | Freq: Once | INTRAVENOUS | Status: AC
  Administered 2023-07-11: 2 g via INTRAVENOUS
  Filled 2023-07-11: qty 50

## 2023-07-11 MED ORDER — METOPROLOL TARTRATE 5 MG/5ML IV SOLN: 5.0000 mg | Freq: Once | INTRAVENOUS | Status: DC

## 2023-07-11 MED ORDER — SIMETHICONE 80 MG PO CHEW
80.0000 mg | CHEWABLE_TABLET | Freq: Four times a day (QID) | ORAL | Status: DC | PRN
  Administered 2023-07-11 – 2023-07-12 (×3): 80 mg via ORAL
  Filled 2023-07-11 (×3): qty 1

## 2023-07-11 NOTE — TOC CM/SW Note (Signed)
Transition of Care [facility]) - Inpatient Brief Assessment   Patient Details  Name: Craig Prince MRN: [id number] Date of Birth: 03/31/1956  [facility] CM/SW Contact:    Gala Lewandowsky, RN Phone Number: 07/11/2023, 11:22 AM   Clinical Narrative: Patient presented for shortness of breath. PTA patient reports that he was independent from home alone and uses DME cane. Patient reports that he has PCP and no issues obtaining medications. Case Manager will continue to follow for transition of care needs as the patient progresses.    Transition of Care Asessment: Insurance and Status: Insurance coverage has been reviewed Patient has primary care physician: Yes Home environment has been reviewed: reviewed Prior level of function:: independent Prior/Current Home Services: No current home services Social Determinants of Health Reivew: SDOH reviewed no interventions necessary Readmission risk has been reviewed: Yes Transition of care needs: no transition of care needs at this time

## 2023-07-11 NOTE — Progress Notes (Signed)
Progress Note  Patient Name: Craig Prince Date of Encounter: 07/11/2023 Primary Cardiologist: Rollene Rotunda, MD   Subjective   He had some difficulty sleeping. Otherwise, no acute events  Vital Signs    Vitals:   07/10/23 1657 07/10/23 1933 07/11/23 0643 07/11/23 0751  BP: (!) 125/94 118/89  (!) 110/90  Pulse: (!) 112 (!) 104  (!) 133  Resp: 16 16  18   Temp: 98.2 F (36.8 C) 97.8 F (36.6 C)  98.5 F (36.9 C)  TempSrc: Oral Oral  Oral  SpO2: 97% 97%    Weight:   90.2 kg     Intake/Output Summary (Last 24 hours) at 07/11/2023 0956 Last data filed at 07/11/2023 0700 Gross per 24 hour  Intake 240 ml  Output 1400 ml  Net -1160 ml   Filed Weights   07/09/23 0811 07/10/23 0416 07/11/23 0643  Weight: 100.9 kg 91.9 kg 90.2 kg    Physical Exam   GEN: No acute distress.   Neck: No JVD Cardiac: iRRR, systolic murmurs, no rubs, or gallops.  Respiratory: Clear to auscultation bilaterally. GI: Soft, nontender, non-distended  MS: No edema  Labs   Telemetry: AFL 130s-140s   Chemistry Recent Labs  Lab 07/09/23 1019 07/10/23 0125 07/11/23 0701  NA 140 139 138  K 3.9 3.3* 3.8  CL 108 103 106  CO2 22 23 21*  GLUCOSE 102* 81 114*  BUN 24* 18 23  CREATININE 1.32* 1.31* 1.53*  CALCIUM 9.0 9.0 9.2  PROT 7.0  --   --   ALBUMIN 4.0  --   --   AST 34  --   --   ALT 39  --   --   ALKPHOS 43  --   --   BILITOT 1.2  --   --   GFRNONAA 59* 59* 49*  ANIONGAP 10 13 11      Hematology Recent Labs  Lab 07/09/23 0902 07/10/23 0125  WBC 8.8 7.8  RBC 6.13* 6.39*  HGB 13.9 14.6  HCT 44.0 46.1  MCV 71.8* 72.1*  MCH 22.7* 22.8*  MCHC 31.6 31.7  RDW 18.8* 18.8*  PLT 154 163    Cardiac EnzymesNo results for input(s): "TROPONINI" in the last 168 hours. No results for input(s): "TROPIPOC" in the last 168 hours.   BNP Recent Labs  Lab 07/09/23 0832  BNP 1,638.5*     DDimer  Recent Labs  Lab 07/09/23 0903  DDIMER 0.78*     Cardiac Studies   Cardiac  Studies & Procedures       ECHOCARDIOGRAM  ECHOCARDIOGRAM COMPLETE 07/10/2023  Narrative ECHOCARDIOGRAM REPORT    Patient Name:   GRAVIEL LIVINGSTON Date of Exam: 07/10/2023 Medical Rec #:  [id number]     Height:       71.0 in Accession #:    [id number]    Weight:       202.5 lb Date of Birth:  06-Aug-1956     BSA:          2.120 m Patient Age:    67 years      BP:           136/95 mmHg Patient Gender: M             HR:           104 bpm. Exam Location:  Inpatient  Procedure: 2D Echo, Color Doppler and Cardiac Doppler  Indications:    Atrial Flutter  History:  Patient has prior history of Echocardiogram examinations, most recent 11/03/2022. CHF and Hypertrophic Cardiomyopathy, CAD, Arrythmias:Atrial Flutter; Risk Factors:Hypertension. Family History of Coronary Artery Disease, Non Ischemic Cardiomyopathy, Aortic root enlargement.  Sonographer:    Raeford Razor RDCS Referring Phys: [id number] ALLISON WOLFE  IMPRESSIONS   1. Known Hypertrophic cardiomyopathy with evidence of LVOT obstruction. Left ventricular ejection fraction, by estimation, is 50 to 55%. The left ventricle has low normal function. The left ventricle has no regional wall motion abnormalities. There is severe concentric left ventricular hypertrophy. Left ventricular diastolic parameters are indeterminate. 2. Right ventricular systolic function is normal. The right ventricular size is normal. 3. Left atrial size was severely dilated. 4. Right atrial size was moderately dilated. 5. The mitral valve is normal in structure. Trivial mitral valve regurgitation. No evidence of mitral stenosis. 6. The aortic valve is tricuspid. Aortic valve regurgitation is not visualized. Aortic valve sclerosis is present, with no evidence of aortic valve stenosis. 7. There is mild dilatation of the aortic root, measuring 41 mm. There is moderate dilatation of the ascending aorta, measuring 42 mm. 8. The inferior vena cava is normal in size  with greater than 50% respiratory variability, suggesting right atrial pressure of 3 mmHg.  Comparison(s): No significant change from prior study.  FINDINGS Left Ventricle: Known Hypertrophic cardiomyopathy with evidence of LVOT obstruction. Left ventricular ejection fraction, by estimation, is 50 to 55%. The left ventricle has low normal function. The left ventricle has no regional wall motion abnormalities. The left ventricular internal cavity size was normal in size. There is severe concentric left ventricular hypertrophy. Left ventricular diastolic parameters are indeterminate.  Right Ventricle: The right ventricular size is normal. No increase in right ventricular wall thickness. Right ventricular systolic function is normal.  Left Atrium: Left atrial size was severely dilated.  Right Atrium: Right atrial size was moderately dilated.  Pericardium: There is no evidence of pericardial effusion. Presence of epicardial fat layer.  Mitral Valve: The mitral valve is normal in structure. Trivial mitral valve regurgitation. No evidence of mitral valve stenosis.  Tricuspid Valve: The tricuspid valve is normal in structure. Tricuspid valve regurgitation is mild . No evidence of tricuspid stenosis.  Aortic Valve: The aortic valve is tricuspid. Aortic valve regurgitation is not visualized. Aortic valve sclerosis is present, with no evidence of aortic valve stenosis. Aortic valve peak gradient measures 4.7 mmHg.  Pulmonic Valve: The pulmonic valve was normal in structure. Pulmonic valve regurgitation is trivial. No evidence of pulmonic stenosis.  Aorta: There is mild dilatation of the aortic root, measuring 41 mm. There is moderate dilatation of the ascending aorta, measuring 42 mm.  Venous: The inferior vena cava is normal in size with greater than 50% respiratory variability, suggesting right atrial pressure of 3 mmHg.  IAS/Shunts: No atrial level shunt detected by color flow Doppler.   LEFT  VENTRICLE PLAX 2D LVIDd:         4.50 cm LVIDs:         3.30 cm LV PW:         2.20 cm LV IVS:        2.20 cm LVOT diam:     2.30 cm LV SV:         60 LV SV Index:   28 LVOT Area:     4.15 cm   RIGHT VENTRICLE          IVC RV Basal diam:  2.80 cm  IVC diam: 2.60 cm TAPSE (M-mode): 2.1 cm  LEFT ATRIUM              Index        RIGHT ATRIUM           Index LA diam:        4.70 cm  2.22 cm/m   RA Area:     23.30 cm LA Vol (A2C):   170.0 ml 80.20 ml/m  RA Volume:   69.80 ml  32.93 ml/m LA Vol (A4C):   161.0 ml 75.95 ml/m LA Biplane Vol: 177.0 ml 83.50 ml/m AORTIC VALVE AV Area (Vmax): 3.63 cm AV Vmax:        108.80 cm/s AV Peak Grad:   4.7 mmHg LVOT Vmax:      95.03 cm/s LVOT Vmean:     62.367 cm/s LVOT VTI:       0.144 m  AORTA Ao Root diam: 4.00 cm Ao Asc diam:  4.20 cm  MITRAL VALVE               TRICUSPID VALVE MV Area (PHT): 4.26 cm    TR Peak grad:   8.4 mmHg MV Decel Time: 178 msec    TR Vmax:        145.00 cm/s MV E velocity: 63.83 cm/s SHUNTS Systemic VTI:  0.14 m Systemic Diam: 2.30 cm  Kardie Tobb DO Electronically signed by Thomasene Ripple DO Signature Date/Time: 07/10/2023/1:47:29 PM    Final    MONITORS  LONG TERM MONITOR (3-14 DAYS) 01/20/2022  Narrative  Had a min HR of 46 bpm, max HR of 179 bpm, and avg HR of 70 bpm. Predominant underlying rhythm was Sinus Rhythm. First Degree AV Block was present. Bundle  Block/IVCD was present. 1 run of Ventricular Tachycardia occurred lasting 18 beats with a max rate of 171 bpm (avg 145 bpm). 3 Supraventricular Tachycardia runs occurred, the run with the fastest interval lasting 4 beats with a max rate of 179 bpm, the longest lasting 9 beats with an avg rate of 121 bpm. Isolated SVEs were rare (<1.0%), SVE Couplets were rare (<1.0%), and SVE Triplets were rare (<1.0%). Isolated VEs were occasional (2.8%, 9747), VE Couplets were rare (<1.0%, 136), and no VE Triplets were present. Ventricular Bigeminy and  Trigeminy were present.  Normal sinus rhythm NSVT lasting 18 beats. Three runs of SVT with the longest being 9 beats.   CT SCANS  CT CORONARY MORPH W/CTA COR W/SCORE 12/29/2016  Addendum 12/29/2016  3:19 PM ADDENDUM REPORT: 12/29/2016 15:17  CLINICAL DATA:  Chest pain  EXAM: Cardiac CTA  MEDICATIONS: Sub lingual nitro. 4mg  and lopressor 5mg   TECHNIQUE: The patient was scanned on a Philips 256 slice scanner. Gantry rotation speed was 270 msecs. Collimation was .9mm. A 100 kV prospective scan was triggered in the descending thoracic aorta at 111 HU's with 5% padding centered around 78% of the R-R interval. Average HR during the scan was 64 bpm. The 3D data set was interpreted on a dedicated work station using MPR, MIP and VRT modes. A total of 80cc of contrast was used.  FINDINGS: Non-cardiac: See separate report from [facility]. No significant findings on limited lung and soft tissue windows.  Calcium Score:  0  Coronary Arteries: Right dominant with no anomalies  LM:  Normal  LAD:  Normal  D1:  Normal  D2: Normal  D3:  Normal  Circumflex:  Normal dominant  OM1:  Normal  OM2:  Normal  RCA:  Normal  PDA:  Left sided normal  PLA:  Left sided normal  IMPRESSION: 1) Normal left dominant coronary arteries  2) Calcium score 0  Charlton Haws   Electronically Signed By: Charlton Haws M.D. On: 12/29/2016 15:17  Narrative EXAM: OVER-READ INTERPRETATION  CT CHEST  The following report is an over-read performed by radiologist Dr. Noe Gens [facility] on 12/29/2016. This over-read does not include interpretation of cardiac or coronary anatomy or pathology. The coronary CTA interpretation by the cardiologist is attached.  COMPARISON:  None.  FINDINGS: Cardiovascular: Aorta is tortuous, measuring 3.0 cm at the aortic valve, 4.3 cm at the sinus of Valsalva, 3.3 cm at the sino-tubular junction, and 3.9 cm maximally in the  ascending thoracic aorta. Heart is borderline in size.  Mediastinum/Nodes: No adenopathy in the visualized lower mediastinum or hila.  Lungs/Pleura: Visualized lungs are clear.  No effusions.  Upper Abdomen: Imaging into the upper abdomen shows no acute findings.  Musculoskeletal: Visualized chest wall soft tissues. No acute bony abnormality.  IMPRESSION: Ectatic ascending thoracic aorta, measuring 4.3 cm maximally in the sinus of Valsalva and 3.9 cm in the ascending thoracic aorta.  No acute extracardiac abnormality.  Electronically Signed: By: Charlett Nose M.D. On: 12/29/2016 11:39   CARDIAC MRI  MR CARDIAC MORPHOLOGY W WO CONTRAST 03/10/2022  Narrative CLINICAL DATA:  Clinical question of hypertrophic cardiomyopathy Study assumes BSA of 2.31 m2.  EXAM: CARDIAC MRI  TECHNIQUE: The patient was scanned on a 1.5 Tesla GE magnet. A dedicated cardiac coil was used. Functional imaging was done using Fiesta sequences. 2,3, and 4 chamber views were done to assess for RWMA's. Modified Simpson's rule using a short axis stack was used to calculate an ejection fraction on a dedicated work [employer]. The patient received 13 cc of Gadavist. After 10 minutes inversion recovery sequences were used to assess for infiltration and scar tissue.  CONTRAST:  13 cc  of Gadavist  FINDINGS: 1. Mildly increased left ventricular size, with LVEDD 66 mm, and LVEDVi 102 mL/m2.  Severe asymmetric septal hypertrophy, with intraventricular septal thickness of 22 mm, posterior wall thickness of 7 mm, and myocardial mass index of 94 g/m2.  Mildly reduced left ventricular systolic function (LVEF =40%). There is septal hypokinesis. There is no systolic anterior motion of the mitral valve.  Global longitudinal strain -6%. Qualitatively increased mechanical dispersion.  Left ventricular parametric mapping notable for normal native T1 and T2 signal.  There is late  gadolinium enhancement in the left ventricular myocardium. There is patchy heterogeneous LGE in the septum (basal to apex) and inferior and anterior (mid to apex). Using a 6 standard deviation assessment, LGE quantification is 23.5% of total myocardium.  2. Normal right ventricular size with RVEDVI 79 mL/m2.  Normal right ventricular thickness.  Low normal right ventricular systolic function (RVEF =45%). There is septal hypokinesis.  3.  Normal left and right atrial size.  4.  Mild dilation of the ascending aorta: 42 mm.  Moderate dilation of the main pulmonary artery, with diameter of 35 mm but with trunk to aorta ratio of 0.8.  5. Valve assessment:  Aortic Valve: Qualitatively, there is no significant regurgitation.  Pulmonic Valve: Qualitatively, there is no significant regurgitation.  Tricuspid Valve: Qualitatively, there is no significant regurgitation.  Mitral Valve: Mild central regurgitation.  6.  Normal pericardium.  No pericardial effusion.  7. Grossly, no extracardiac findings. Recommended dedicated study if concerned for non-cardiac pathology.  IMPRESSION: Study suggestive of hypertrophic cardiomyopathy. No LVOT obstructive demonstrated in this  study.  LVEF 40%, with septal thickness of 22 mm and LGE quantification of 23.5%.  Compared to prior, LVEF is slightly decrease and septal thickness is slightly increased. LGE is extensive.  Riley Lam MD   Electronically Signed By: Riley Lam M.D. On: 03/10/2022 18:47              Assessment & Plan   AFL, new - CHADSVASC NA due to HCM - continue eliquis  - continue Coreg - given HCM and EF < 40%,  TEE/DCCV recommended, he is euvolemic, will schedule him (likely Wednesday) Informed Consent   Shared Decision Making/Informed Consent The risks [stroke, cardiac arrhythmias rarely resulting in the need for a temporary or permanent pacemaker, skin irritation or burns, esophageal  damage, perforation (1:10,000 risk), bleeding, pharyngeal hematoma as well as other potential complications associated with conscious sedation including aspiration, arrhythmia, respiratory failure and death], benefits (treatment guidance, restoration of normal sinus rhythm, diagnostic support) and alternatives of a transesophageal echocardiogram guided cardioversion were discussed in detail with Mr. Dillahunt and he is willing to proceed.     HCM: - no LVOT gradient at last assessment - with prior NSVT, he deferred ICD, despite LVEF < 40%, NSVT, and LGE > 10% - discussed outpatient screening for his 7 children   Acute on Chronic HFrEF HTN NYHA IV, Stage C, hypervolemic - continue BB, SGLT, ARNI, he has allergy to aldactone, eplerenone as outpatient - transition to oral lasix 40 mg daily   Mild aortic dilation - potential TEE this admission, mild at last eval   For questions or updates, please contact [facility] Please consult [email or web address] for contact info under Cardiology/STEMI.      Maisie Fus, MD

## 2023-07-11 NOTE — Progress Notes (Signed)
   07/11/23 0751  Assess: MEWS Score  Temp 98.5 F (36.9 C)  BP (!) 110/90  MAP (mmHg) 99  Pulse Rate (!) 133  Resp 18  Level of Consciousness Alert  Assess: MEWS Score  MEWS Temp 0  MEWS Systolic 0  MEWS Pulse 3  MEWS RR 0  MEWS LOC 0  MEWS Score 3  MEWS Score Color Yellow  Assess: if the MEWS score is Yellow or Red  Were vital signs accurate and taken at a resting state? Yes  Does the patient meet 2 or more of the SIRS criteria? No  MEWS guidelines implemented  Yes, yellow  Treat  MEWS Interventions Considered administering scheduled or prn medications/treatments as ordered  Take Vital Signs  Increase Vital Sign Frequency  Yellow: Q2hr x1, continue Q4hrs until patient remains green for 12hrs  Escalate  MEWS: Escalate Yellow: Discuss with charge nurse and consider notifying provider and/or RRT  Notify: Charge Nurse/RN  Name of Charge Nurse/RN Notified   RN  Assess: SIRS CRITERIA  SIRS Temperature  0  SIRS Pulse 1  SIRS Respirations  0  SIRS WBC 0  SIRS Score Sum  1

## 2023-07-11 NOTE — Progress Notes (Addendum)
TRH night cross cover note:   I was notified by RN of the patient's request for increased dose of trazodone this evening relative to the 50 mg that he received last evening as a sleep aid.  I subsequently discontinued existing trazodone and ordered trazodone 100 mg p.o. nightly as needed for insomnia.   Update: patient is also c/o gas pain, requesting simethicone. I subsequently ordered prn simethicone for him.     Craig Pigg, DO Hospitalist

## 2023-07-11 NOTE — Progress Notes (Signed)
This RN paged cardiology around 1730 about HR staying in the 140's in afib. Patient asymptomatic and BP stable at this time. Per Johny Shears, [place]- if BP drops below 100 systolic or if pt becomes symptomatic of HR in the 140's then ok to start amio gtt.

## 2023-07-11 NOTE — TOC Benefit Eligibility Note (Signed)
Pharmacy Patient [facility] verification completed.    The patient is insured through [facility]. Patient has [employer], may use a copay card, and/or apply for patient assistance if available.    Ran test claim for Eliquis and the current 30 day co-pay is $15.00.   This test claim was processed through [facility]- copay amounts may vary at other pharmacies due to pharmacy/plan contracts, or as the patient moves through the different stages of their insurance plan.

## 2023-07-11 NOTE — Progress Notes (Signed)
   07/11/23 1442  Assess: MEWS Score  BP 99/87  MAP (mmHg) 93  Pulse Rate (!) 144  Resp 16  Level of Consciousness Alert  Assess: MEWS Score  MEWS Temp 0  MEWS Systolic 1  MEWS Pulse 3  MEWS RR 0  MEWS LOC 0  MEWS Score 4  MEWS Score Color Red  Assess: if the MEWS score is Yellow or Red  Were vital signs accurate and taken at a resting state? Yes  Does the patient meet 2 or more of the SIRS criteria? No  MEWS guidelines implemented  Yes, red  Treat  MEWS Interventions Considered administering scheduled or prn medications/treatments as ordered  Take Vital Signs  Increase Vital Sign Frequency  Red: Q1hr x2, continue Q4hrs until patient remains green for 12hrs  Escalate  MEWS: Escalate Red: Discuss with charge nurse and notify provider. Consider notifying RRT. If remains red for 2 hours consider need for higher level of care  Notify: Charge Nurse/RN  Name of Charge Nurse/RN Notified Psychiatric nurse RN  Assess: SIRS CRITERIA  SIRS Temperature  0  SIRS Pulse 1  SIRS Respirations  0  SIRS WBC 0  SIRS Score Sum  1

## 2023-07-11 NOTE — Plan of Care (Signed)

## 2023-07-11 NOTE — Progress Notes (Signed)
Heart Failure Navigator Progress Note  Assessed for Heart & Vascular TOC clinic readiness.  Patient EF 50-55% HOCM, TEE/DCCV possibly 07/13/2023, No HF TOC per Dr. Ella Jubilee, follow up with [facility]. .   Navigator will sign off at this time.   Rhae Hammock, BSN, Scientist, clinical (histocompatibility and immunogenetics) Only

## 2023-07-11 NOTE — Progress Notes (Signed)
Progress Note   Patient: Craig Prince WGN:[id number] DOB: 1956/10/18 DOA: 07/09/2023     1 DOS: the patient was seen and examined on 07/11/2023   Brief hospital course: [name] was admitted to the hospital with the working diagnosis of heart failure in the setting of atrial fibrillation with RVR.   67 yo male with the past medical history of coronary artery disease, heart failure, hypertension, and gout who presented with dyspnea. Reported rapidly worsening dyspnea on exertion for about 24 hrs, prompting him to come to the ED. On his initial physical examination his blood pressure was 151/111, HR 88, RR 16 and 02 saturation 97%, lungs with no wheezing or rhonchi, heart with S1 and S2 present irregularly irregular with no gallops or rubs, abdomen with no distention and no lower extremity edema.   Na 140, K 3,9 Cl 108 bicarbonate 22, glucose 102 bun 24 cr 1,32 Mg 1,8  Wbc 8,8, hgb 13,9 plt 154  D dimer 0,78  High sensitive troponin 38 and 40  Urine analysis SG >1.030, protein 100, glucose 250, wbc 0-5, rbc 0-5. Small hgb.   Chest radiograph with mild cardiomegaly, bilateral hilar vascular congestion with cephalization of the vasculature, no effusions.  CT chest with no acute pulmonary embolism or thoracic aortic dissection.  Dilated central pulmonary arteries suggesting pulmonary hypertension.  Trace right pleural effusion.   EKG 101 bpm, left axis deviation, left bundle branch block, qtc not prolonged, atrial flutter with variable block, no significant ST segment or T wave changes.   Patient was placed on IV furosemide for diuresis and IV heparin for anticoagulation. Rate control with AV blockade.   08/12 this pm back into atrial fibrillation with RVR 140 bpm.   Assessment and Plan: * Acute on chronic diastolic CHF (congestive heart failure) (HCC) Echocardiogram with preserved LV systolic function with EF 50 to 55%, severe LVH, RV systolic function preserved, LA with severe  dilatation, RA with moderate dilatation, moderate dilatation of the ascending aorta 42 mm,  Hypertrophic cardiomyopathy with evidence of LVOT obstruction.   Urine output is 1,700 ml Systolic blood pressure 109 to 110  mmHg. .  Medical therapy with carvedilol, entresto and SGLT 2 inh.  Transitioned to furosemide 40 mg po daily for further diuresis.   Atrial flutter (HCC) This pm patient back into RVR, atrial flutter, he denies chest pain, dyspnea or palpitations.   He has been on carvedilol 25 mg po bid Plan to add IV metoprolol one dose of 2.5 mg and may repeat up to total of 5 mg.  If persistent tachycardia may need to start on amiodarone infusion. Will check with cardiology about antiarrhythmics.   Anticoagulation with apixaban. Plan for inpatient direct current cardioversion.   Essential hypertension Continue blood pressure monitoring. On carvedilol and entresto.   AKI (acute kidney injury) (HCC) Hypokalemia  Volume status has improved, renal function today with serum cr at 1,53 with K at 3,8 and serum bicarbonate at 21. Na 138 Mg 1,8   Add 40 meq Kcl and 2 g Mag sulfate today Target K of 4 and Mg 2.     Non Obstructive CAD by cath 06/2009 No chest pain, continue blood pressure control.         Subjective: Patient with no chest pain or palpitations, no dyspnea, no PND or orthopnea.   Physical Exam: Vitals:   07/10/23 1933 07/11/23 0643 07/11/23 0751 07/11/23 1327  BP: 118/89  (!) 110/90 109/86  Pulse: (!) 104  (!) 133 Marland Kitchen)  138  Resp: 16  18 16   Temp: 97.8 F (36.6 C)  98.5 F (36.9 C)   TempSrc: Oral  Oral Oral  SpO2: 97%     Weight:  90.2 kg     Neurology awake and alert ENT with mild pallor Cardiovascular with S1 and S2 present and tachycardic with no gallops or rubs No JVD No  lower extremity edema Respiratory with no rales or wheezing, no rhonchi Abdomen with no distention  Data Reviewed:    Family Communication: no family at the bedside    Disposition: Status is: Inpatient Remains inpatient appropriate because: uncontrolled atrial fibrillation   Planned Discharge Destination: Home      Author: Coralie Keens, MD 07/11/2023 2:23 PM  For on call review [email or web address].

## 2023-07-11 NOTE — Progress Notes (Signed)
   07/11/23 1536  Assess: MEWS Score  BP (!) 108/90  MAP (mmHg) 89  Pulse Rate (!) 144  Level of Consciousness Alert  SpO2 97 %  O2 Device Room Air  Assess: MEWS Score  MEWS Temp 0  MEWS Systolic 0  MEWS Pulse 3  MEWS RR 0  MEWS LOC 0  MEWS Score 3  MEWS Score Color Yellow  Assess: if the MEWS score is Yellow or Red  Were vital signs accurate and taken at a resting state? Yes  Does the patient meet 2 or more of the SIRS criteria? No  MEWS guidelines implemented  Yes, yellow  Treat  MEWS Interventions Considered administering scheduled or prn medications/treatments as ordered  Take Vital Signs  Increase Vital Sign Frequency  Yellow: Q2hr x1, continue Q4hrs until patient remains green for 12hrs  Escalate  MEWS: Escalate Yellow: Discuss with charge nurse and consider notifying provider and/or RRT  Notify: Charge Nurse/RN  Name of Charge Nurse/RN Notified Gertie Baron RN  Provider Notification  Provider Name/Title Yvonna Alanis PA  Date Provider Notified 07/11/23  Method of Notification Face-to-face  Notification Reason New onset of dysrhythmia  Provider response At bedside  Assess: SIRS CRITERIA  SIRS Temperature  0  SIRS Pulse 1  SIRS Respirations  0  SIRS WBC 0  SIRS Score Sum  1

## 2023-07-12 DIAGNOSIS — N179 Acute kidney failure, unspecified: Secondary | ICD-10-CM | POA: Diagnosis not present

## 2023-07-12 DIAGNOSIS — I5033 Acute on chronic diastolic (congestive) heart failure: Secondary | ICD-10-CM | POA: Diagnosis not present

## 2023-07-12 DIAGNOSIS — I4892 Unspecified atrial flutter: Secondary | ICD-10-CM | POA: Diagnosis not present

## 2023-07-12 DIAGNOSIS — I10 Essential (primary) hypertension: Secondary | ICD-10-CM | POA: Diagnosis not present

## 2023-07-12 DIAGNOSIS — I421 Obstructive hypertrophic cardiomyopathy: Secondary | ICD-10-CM | POA: Diagnosis not present

## 2023-07-12 MED ORDER — ALUM & MAG HYDROXIDE-SIMETH 200-200-20 MG/5ML PO SUSP
30.0000 mL | ORAL | Status: DC | PRN
  Administered 2023-07-12: 30 mL via ORAL
  Filled 2023-07-12: qty 30

## 2023-07-12 MED ORDER — POTASSIUM CHLORIDE CRYS ER 20 MEQ PO TBCR
40.0000 meq | EXTENDED_RELEASE_TABLET | Freq: Once | ORAL | Status: AC
  Administered 2023-07-12: 40 meq via ORAL
  Filled 2023-07-12: qty 2

## 2023-07-12 MED ORDER — DICLOFENAC SODIUM 1 % EX GEL
2.0000 g | Freq: Every day | CUTANEOUS | Status: DC | PRN
  Administered 2023-07-12: 2 g via TOPICAL
  Filled 2023-07-12: qty 100

## 2023-07-12 MED ORDER — OFF THE BEAT BOOK
Freq: Once | Status: AC
  Filled 2023-07-12: qty 1

## 2023-07-12 NOTE — Plan of Care (Signed)

## 2023-07-12 NOTE — Progress Notes (Signed)
TRH night cross cover note:   I was notified by RN of the patient's request for Voltaren gel for arthralgias.  Per brief chart review, it appears that he is here with acute on chronic combined heart failure.  However, there is diminished risk for systemic absorption of this topical NSAID.  Subsequently I placed order for Voltaren gel topical application to affected areas daily as needed.    Newton Pigg, DO Hospitalist

## 2023-07-12 NOTE — Plan of Care (Signed)
  Problem: Education: Goal: Knowledge of General Education information will improve Description: Including pain rating scale, medication(s)/side effects and non-pharmacologic comfort measures Outcome: Progressing   Problem: Health Behavior/Discharge Planning: Goal: Ability to manage health-related needs will improve Outcome: Progressing   Problem: Clinical Measurements: Goal: Will remain free from infection Outcome: Progressing   Problem: Activity: Goal: Risk for activity intolerance will decrease Outcome: Progressing   Problem: Nutrition: Goal: Adequate nutrition will be maintained Outcome: Progressing   Problem: Coping: Goal: Level of anxiety will decrease Outcome: Progressing   Problem: Safety: Goal: Ability to remain free from injury will improve Outcome: Progressing   Problem: Skin Integrity: Goal: Risk for impaired skin integrity will decrease Outcome: Progressing   Problem: Education: Goal: Knowledge of disease or condition will improve Outcome: Progressing Goal: Understanding of medication regimen will improve Outcome: Progressing

## 2023-07-12 NOTE — Progress Notes (Signed)
Progress Note  Patient Name: Craig Prince Date of Encounter: 07/12/2023 Primary Cardiologist: Rollene Rotunda, MD   Subjective   He is asymptomatic. Asked questions about TEE/DCCV   Interval hx: Rates still elevated up to 140s. 90s systolic Crt up 1.5 from 1.3  Vital Signs    Vitals:   07/11/23 2353 07/12/23 0530 07/12/23 0531 07/12/23 0826  BP: 122/87 95/74 101/72 92/72  Pulse: (!) 138 (!) 138 (!) 144 (!) 123  Resp: 19 16 16 16   Temp: 98.1 F (36.7 C) 98.1 F (36.7 C) 98.1 F (36.7 C) 98 F (36.7 C)  TempSrc: Oral Oral  Oral  SpO2: 97% 95% 96% 99%  Weight:  94.1 kg    Height:        Intake/Output Summary (Last 24 hours) at 07/12/2023 0853 Last data filed at 07/12/2023 0347 Gross per 24 hour  Intake 1032.84 ml  Output 625 ml  Net 407.84 ml   Filed Weights   07/10/23 0416 07/11/23 0643 07/12/23 0530  Weight: 91.9 kg 90.2 kg 94.1 kg    Physical Exam   GEN: No acute distress.   Neck: No JVD Cardiac: iRRR, systolic murmurs, no rubs, or gallops.  Respiratory: Clear to auscultation bilaterally. GI: Soft, nontender, non-distended  MS: No edema  Labs   Telemetry: variable AFL 130s-140s   Chemistry Recent Labs  Lab 07/09/23 1019 07/10/23 0125 07/11/23 0701 07/12/23 0056  NA 140 139 138 136  K 3.9 3.3* 3.8 3.7  CL 108 103 106 102  CO2 22 23 21* 21*  GLUCOSE 102* 81 114* 111*  BUN 24* 18 23 30*  CREATININE 1.32* 1.31* 1.53* 1.56*  CALCIUM 9.0 9.0 9.2 9.1  PROT 7.0  --   --   --   ALBUMIN 4.0  --   --   --   AST 34  --   --   --   ALT 39  --   --   --   ALKPHOS 43  --   --   --   BILITOT 1.2  --   --   --   GFRNONAA 59* 59* 49* 48*  ANIONGAP 10 13 11 13      Hematology Recent Labs  Lab 07/09/23 0902 07/10/23 0125  WBC 8.8 7.8  RBC 6.13* 6.39*  HGB 13.9 14.6  HCT 44.0 46.1  MCV 71.8* 72.1*  MCH 22.7* 22.8*  MCHC 31.6 31.7  RDW 18.8* 18.8*  PLT 154 163    Cardiac EnzymesNo results for input(s): "TROPONINI" in the last 168 hours. No  results for input(s): "TROPIPOC" in the last 168 hours.   BNP Recent Labs  Lab 07/09/23 0832  BNP 1,638.5*     DDimer  Recent Labs  Lab 07/09/23 0903  DDIMER 0.78*     Cardiac Studies   Cardiac Studies & Procedures       ECHOCARDIOGRAM  ECHOCARDIOGRAM COMPLETE 07/10/2023  Narrative ECHOCARDIOGRAM REPORT    Patient Name:   Craig Prince Date of Exam: 07/10/2023 Medical Rec #:  [id number]     Height:       71.0 in Accession #:    [id number]    Weight:       202.5 lb Date of Birth:  03-18-1956     BSA:          2.120 m Patient Age:    67 years      BP:           136/95 mmHg Patient  Gender: M             HR:           104 bpm. Exam Location:  Inpatient  Procedure: 2D Echo, Color Doppler and Cardiac Doppler  Indications:    Atrial Flutter  History:        Patient has prior history of Echocardiogram examinations, most recent 11/03/2022. CHF and Hypertrophic Cardiomyopathy, CAD, Arrythmias:Atrial Flutter; Risk Factors:Hypertension. Family History of Coronary Artery Disease, Non Ischemic Cardiomyopathy, Aortic root enlargement.  Sonographer:    Raeford Razor RDCS Referring Phys: [id number] ALLISON WOLFE  IMPRESSIONS   1. Known Hypertrophic cardiomyopathy with evidence of LVOT obstruction. Left ventricular ejection fraction, by estimation, is 50 to 55%. The left ventricle has low normal function. The left ventricle has no regional wall motion abnormalities. There is severe concentric left ventricular hypertrophy. Left ventricular diastolic parameters are indeterminate. 2. Right ventricular systolic function is normal. The right ventricular size is normal. 3. Left atrial size was severely dilated. 4. Right atrial size was moderately dilated. 5. The mitral valve is normal in structure. Trivial mitral valve regurgitation. No evidence of mitral stenosis. 6. The aortic valve is tricuspid. Aortic valve regurgitation is not visualized. Aortic valve sclerosis is present, with no  evidence of aortic valve stenosis. 7. There is mild dilatation of the aortic root, measuring 41 mm. There is moderate dilatation of the ascending aorta, measuring 42 mm. 8. The inferior vena cava is normal in size with greater than 50% respiratory variability, suggesting right atrial pressure of 3 mmHg.  Comparison(s): No significant change from prior study.  FINDINGS Left Ventricle: Known Hypertrophic cardiomyopathy with evidence of LVOT obstruction. Left ventricular ejection fraction, by estimation, is 50 to 55%. The left ventricle has low normal function. The left ventricle has no regional wall motion abnormalities. The left ventricular internal cavity size was normal in size. There is severe concentric left ventricular hypertrophy. Left ventricular diastolic parameters are indeterminate.  Right Ventricle: The right ventricular size is normal. No increase in right ventricular wall thickness. Right ventricular systolic function is normal.  Left Atrium: Left atrial size was severely dilated.  Right Atrium: Right atrial size was moderately dilated.  Pericardium: There is no evidence of pericardial effusion. Presence of epicardial fat layer.  Mitral Valve: The mitral valve is normal in structure. Trivial mitral valve regurgitation. No evidence of mitral valve stenosis.  Tricuspid Valve: The tricuspid valve is normal in structure. Tricuspid valve regurgitation is mild . No evidence of tricuspid stenosis.  Aortic Valve: The aortic valve is tricuspid. Aortic valve regurgitation is not visualized. Aortic valve sclerosis is present, with no evidence of aortic valve stenosis. Aortic valve peak gradient measures 4.7 mmHg.  Pulmonic Valve: The pulmonic valve was normal in structure. Pulmonic valve regurgitation is trivial. No evidence of pulmonic stenosis.  Aorta: There is mild dilatation of the aortic root, measuring 41 mm. There is moderate dilatation of the ascending aorta, measuring 42  mm.  Venous: The inferior vena cava is normal in size with greater than 50% respiratory variability, suggesting right atrial pressure of 3 mmHg.  IAS/Shunts: No atrial level shunt detected by color flow Doppler.   LEFT VENTRICLE PLAX 2D LVIDd:         4.50 cm LVIDs:         3.30 cm LV PW:         2.20 cm LV IVS:        2.20 cm LVOT diam:     2.30  cm LV SV:         60 LV SV Index:   28 LVOT Area:     4.15 cm   RIGHT VENTRICLE          IVC RV Basal diam:  2.80 cm  IVC diam: 2.60 cm TAPSE (M-mode): 2.1 cm  LEFT ATRIUM              Index        RIGHT ATRIUM           Index LA diam:        4.70 cm  2.22 cm/m   RA Area:     23.30 cm LA Vol (A2C):   170.0 ml 80.20 ml/m  RA Volume:   69.80 ml  32.93 ml/m LA Vol (A4C):   161.0 ml 75.95 ml/m LA Biplane Vol: 177.0 ml 83.50 ml/m AORTIC VALVE AV Area (Vmax): 3.63 cm AV Vmax:        108.80 cm/s AV Peak Grad:   4.7 mmHg LVOT Vmax:      95.03 cm/s LVOT Vmean:     62.367 cm/s LVOT VTI:       0.144 m  AORTA Ao Root diam: 4.00 cm Ao Asc diam:  4.20 cm  MITRAL VALVE               TRICUSPID VALVE MV Area (PHT): 4.26 cm    TR Peak grad:   8.4 mmHg MV Decel Time: 178 msec    TR Vmax:        145.00 cm/s MV E velocity: 63.83 cm/s SHUNTS Systemic VTI:  0.14 m Systemic Diam: 2.30 cm  Kardie Tobb DO Electronically signed by Thomasene Ripple DO Signature Date/Time: 07/10/2023/1:47:29 PM    Final    MONITORS  LONG TERM MONITOR (3-14 DAYS) 01/20/2022  Narrative  Had a min HR of 46 bpm, max HR of 179 bpm, and avg HR of 70 bpm. Predominant underlying rhythm was Sinus Rhythm. First Degree AV Block was present. Bundle  Block/IVCD was present. 1 run of Ventricular Tachycardia occurred lasting 18 beats with a max rate of 171 bpm (avg 145 bpm). 3 Supraventricular Tachycardia runs occurred, the run with the fastest interval lasting 4 beats with a max rate of 179 bpm, the longest lasting 9 beats with an avg rate of 121 bpm. Isolated  SVEs were rare (<1.0%), SVE Couplets were rare (<1.0%), and SVE Triplets were rare (<1.0%). Isolated VEs were occasional (2.8%, 9747), VE Couplets were rare (<1.0%, 136), and no VE Triplets were present. Ventricular Bigeminy and Trigeminy were present.  Normal sinus rhythm NSVT lasting 18 beats. Three runs of SVT with the longest being 9 beats.   CT SCANS  CT CORONARY MORPH W/CTA COR W/SCORE 12/29/2016  Addendum 12/29/2016  3:19 PM ADDENDUM REPORT: 12/29/2016 15:17  CLINICAL DATA:  Chest pain  EXAM: Cardiac CTA  MEDICATIONS: Sub lingual nitro. 4mg  and lopressor 5mg   TECHNIQUE: The patient was scanned on a Philips 256 slice scanner. Gantry rotation speed was 270 msecs. Collimation was .9mm. A 100 kV prospective scan was triggered in the descending thoracic aorta at 111 HU's with 5% padding centered around 78% of the R-R interval. Average HR during the scan was 64 bpm. The 3D data set was interpreted on a dedicated work station using MPR, MIP and VRT modes. A total of 80cc of contrast was used.  FINDINGS: Non-cardiac: See separate report from [facility]. No significant findings on limited lung and soft tissue  windows.  Calcium Score:  0  Coronary Arteries: Right dominant with no anomalies  LM:  Normal  LAD:  Normal  D1:  Normal  D2: Normal  D3:  Normal  Circumflex:  Normal dominant  OM1:  Normal  OM2:  Normal  RCA:  Normal  PDA:  Left sided normal  PLA:  Left sided normal  IMPRESSION: 1) Normal left dominant coronary arteries  2) Calcium score 0  Charlton Haws   Electronically Signed By: Charlton Haws M.D. On: 12/29/2016 15:17  Narrative EXAM: OVER-READ INTERPRETATION  CT CHEST  The following report is an over-read performed by radiologist Dr. Noe Gens [facility] on 12/29/2016. This over-read does not include interpretation of cardiac or coronary anatomy or pathology. The coronary CTA interpretation by the  cardiologist is attached.  COMPARISON:  None.  FINDINGS: Cardiovascular: Aorta is tortuous, measuring 3.0 cm at the aortic valve, 4.3 cm at the sinus of Valsalva, 3.3 cm at the sino-tubular junction, and 3.9 cm maximally in the ascending thoracic aorta. Heart is borderline in size.  Mediastinum/Nodes: No adenopathy in the visualized lower mediastinum or hila.  Lungs/Pleura: Visualized lungs are clear.  No effusions.  Upper Abdomen: Imaging into the upper abdomen shows no acute findings.  Musculoskeletal: Visualized chest wall soft tissues. No acute bony abnormality.  IMPRESSION: Ectatic ascending thoracic aorta, measuring 4.3 cm maximally in the sinus of Valsalva and 3.9 cm in the ascending thoracic aorta.  No acute extracardiac abnormality.  Electronically Signed: By: Charlett Nose M.D. On: 12/29/2016 11:39   CARDIAC MRI  MR CARDIAC MORPHOLOGY W WO CONTRAST 03/10/2022  Narrative CLINICAL DATA:  Clinical question of hypertrophic cardiomyopathy Study assumes BSA of 2.31 m2.  EXAM: CARDIAC MRI  TECHNIQUE: The patient was scanned on a 1.5 Tesla GE magnet. A dedicated cardiac coil was used. Functional imaging was done using Fiesta sequences. 2,3, and 4 chamber views were done to assess for RWMA's. Modified Simpson's rule using a short axis stack was used to calculate an ejection fraction on a dedicated work [employer]. The patient received 13 cc of Gadavist. After 10 minutes inversion recovery sequences were used to assess for infiltration and scar tissue.  CONTRAST:  13 cc  of Gadavist  FINDINGS: 1. Mildly increased left ventricular size, with LVEDD 66 mm, and LVEDVi 102 mL/m2.  Severe asymmetric septal hypertrophy, with intraventricular septal thickness of 22 mm, posterior wall thickness of 7 mm, and myocardial mass index of 94 g/m2.  Mildly reduced left ventricular systolic function (LVEF =40%). There is septal hypokinesis. There is no  systolic anterior motion of the mitral valve.  Global longitudinal strain -6%. Qualitatively increased mechanical dispersion.  Left ventricular parametric mapping notable for normal native T1 and T2 signal.  There is late gadolinium enhancement in the left ventricular myocardium. There is patchy heterogeneous LGE in the septum (basal to apex) and inferior and anterior (mid to apex). Using a 6 standard deviation assessment, LGE quantification is 23.5% of total myocardium.  2. Normal right ventricular size with RVEDVI 79 mL/m2.  Normal right ventricular thickness.  Low normal right ventricular systolic function (RVEF =45%). There is septal hypokinesis.  3.  Normal left and right atrial size.  4.  Mild dilation of the ascending aorta: 42 mm.  Moderate dilation of the main pulmonary artery, with diameter of 35 mm but with trunk to aorta ratio of 0.8.  5. Valve assessment:  Aortic Valve: Qualitatively, there is no significant regurgitation.  Pulmonic Valve:  Qualitatively, there is no significant regurgitation.  Tricuspid Valve: Qualitatively, there is no significant regurgitation.  Mitral Valve: Mild central regurgitation.  6.  Normal pericardium.  No pericardial effusion.  7. Grossly, no extracardiac findings. Recommended dedicated study if concerned for non-cardiac pathology.  IMPRESSION: Study suggestive of hypertrophic cardiomyopathy. No LVOT obstructive demonstrated in this study.  LVEF 40%, with septal thickness of 22 mm and LGE quantification of 23.5%.  Compared to prior, LVEF is slightly decrease and septal thickness is slightly increased. LGE is extensive.  Riley Lam MD   Electronically Signed By: Riley Lam M.D. On: 03/10/2022 18:47              Assessment & Plan   AFL, new - CHADSVASC NA due to HCM - continue eliquis  - continue Coreg - avoid dilt with CMR EF 40% - given soft Bps, rates not well controlled, plan  for TEE/DCCV recommended, he is euvolemic, he is scheduled for Wed Informed Consent   Shared Decision Making/Informed Consent The risks [stroke, cardiac arrhythmias rarely resulting in the need for a temporary or permanent pacemaker, skin irritation or burns, esophageal damage, perforation (1:10,000 risk), bleeding, pharyngeal hematoma as well as other potential complications associated with conscious sedation including aspiration, arrhythmia, respiratory failure and death], benefits (treatment guidance, restoration of normal sinus rhythm, diagnostic support) and alternatives of a transesophageal echocardiogram guided cardioversion were discussed in detail with Mr. Colombe and he is willing to proceed.     HCM: Echo shows EF 50-55%, CMR 02/2022: EF was 40% - no LVOT gradient at last assessment on CMR - with prior NSVT, he deferred ICD, despite LVEF < 40%, NSVT, and LGE > 10% - discussed outpatient screening for his 7 children  AKI - diuretic holiday   Acute on Chronic HFrEF per CMR HTN NYHA IV, Stage C, euvolemic - continue BB, SGLT, ARNI, he has allergy to aldactone, eplerenone as outpatient - transition to oral lasix 40 mg daily once crt normalizes; will consider PRN   Mild aortic dilation - potential TEE this admission, mild at last eval   For questions or updates, please contact [facility] Please consult [email or web address] for contact info under Cardiology/STEMI.      Maisie Fus, MD

## 2023-07-12 NOTE — Progress Notes (Signed)
   07/12/23 0826  Assess: MEWS Score  Temp 98 F (36.7 C)  BP 92/72  MAP (mmHg) 79  Pulse Rate (!) 123  ECG Heart Rate (!) 143  Resp 16  Level of Consciousness Alert  SpO2 99 %  O2 Device Room Air  Assess: MEWS Score  MEWS Temp 0  MEWS Systolic 1  MEWS Pulse 3  MEWS RR 0  MEWS LOC 0  MEWS Score 4  MEWS Score Color Red  Assess: if the MEWS score is Yellow or Red  Were vital signs accurate and taken at a resting state? Yes  Does the patient meet 2 or more of the SIRS criteria? No  MEWS guidelines implemented  Yes, red  Treat  MEWS Interventions Considered administering scheduled or prn medications/treatments as ordered  Take Vital Signs  Increase Vital Sign Frequency  Red: Q1hr x2, continue Q4hrs until patient remains green for 12hrs  Escalate  MEWS: Escalate Red: Discuss with charge nurse and notify provider. Consider notifying RRT. If remains red for 2 hours consider need for higher level of care  Notify: Charge Nurse/RN  Name of Charge Nurse/RN Notified Psychiatric nurse RN  Assess: SIRS CRITERIA  SIRS Temperature  0  SIRS Pulse 1  SIRS Respirations  0  SIRS WBC 0  SIRS Score Sum  1

## 2023-07-12 NOTE — Progress Notes (Signed)
Progress Note   Patient: Craig Prince VHQ:[id number] DOB: November 03, 1956 DOA: 07/09/2023     2 DOS: the patient was seen and examined on 07/12/2023   Brief hospital course: Mr. Stutes was admitted to the hospital with the working diagnosis of heart failure in the setting of atrial fibrillation with RVR.   67 yo male with the past medical history of coronary artery disease, heart failure, hypertension, and gout who presented with dyspnea. Reported rapidly worsening dyspnea on exertion for about 24 hrs, prompting him to come to the ED. On his initial physical examination his blood pressure was 151/111, HR 88, RR 16 and 02 saturation 97%, lungs with no wheezing or rhonchi, heart with S1 and S2 present irregularly irregular with no gallops or rubs, abdomen with no distention and no lower extremity edema.   Na 140, K 3,9 Cl 108 bicarbonate 22, glucose 102 bun 24 cr 1,32 Mg 1,8  Wbc 8,8, hgb 13,9 plt 154  D dimer 0,78  High sensitive troponin 38 and 40  Urine analysis SG >1.030, protein 100, glucose 250, wbc 0-5, rbc 0-5. Small hgb.   Chest radiograph with mild cardiomegaly, bilateral hilar vascular congestion with cephalization of the vasculature, no effusions.  CT chest with no acute pulmonary embolism or thoracic aortic dissection.  Dilated central pulmonary arteries suggesting pulmonary hypertension.  Trace right pleural effusion.   EKG 101 bpm, left axis deviation, left bundle branch block, qtc not prolonged, atrial flutter with variable block, no significant ST segment or T wave changes.   Patient was placed on IV furosemide for diuresis and IV heparin for anticoagulation. Rate control with AV blockade.   08/12 this pm back into atrial fibrillation with RVR 140 bpm.  08/13 continue with atrial fibrillation with RVR, plan for direct current cardioversion tomorrow.   Assessment and Plan: * Acute on chronic diastolic CHF (congestive heart failure) (HCC) Echocardiogram with preserved LV  systolic function with EF 50 to 55%, severe LVH, RV systolic function preserved, LA with severe dilatation, RA with moderate dilatation, moderate dilatation of the ascending aorta 42 mm,  Hypertrophic cardiomyopathy with evidence of LVOT obstruction.   Urine output is 625 ml Systolic blood pressure 98 to 413  mmHg. .  Medical therapy with carvedilol, entresto and SGLT 2 inh.  Holding furosemide for now.   Atrial flutter (HCC) Continue atrial fibrillation RVR up to 140   Continue carvedilol for B blockade. Follow up with cardioversion tomorrow,   Anticoagulation with apixaban.  Essential hypertension Continue blood pressure monitoring. On carvedilol and entresto.   AKI (acute kidney injury) (HCC) Hypokalemia  Renal function with serum cr at 1,5 with K at 3,7 and serum bicarbonate at 21.  Mg 2,2   Holding furosemide today. Follow up renal function and electrolytes in am.    Non Obstructive CAD by cath 06/2009 No chest pain, continue blood pressure control.         Subjective: patient is feeling well, no dyspnea, occasional palpitations no chest pain   Physical Exam: Vitals:   07/12/23 0900 07/12/23 1026 07/12/23 1100 07/12/23 1235  BP:  98/84  103/81  Pulse: 91 (!) 112 73   Resp:      Temp:  98 F (36.7 C)    TempSrc:  Oral    SpO2: 99%  98%   Weight:      Height:       Neurology awake and alert ENT with no pallor Cardiovascular with S1 and S2 present, irregularly irregular with  no gallops, rubs or murmurs No JVD No lower extremity edema Respiratory with no rales or wheezing Abdomen with no distention  Data Reviewed:    Family Communication: daughter in law at the bedside   Disposition: Status is: Inpatient Remains inpatient appropriate because: pending cardioversion.   Planned Discharge Destination: home       Author: Coralie Keens, MD 07/12/2023 4:12 PM  For on call review [email or web address].

## 2023-07-13 ENCOUNTER — Other Ambulatory Visit (HOSPITAL_COMMUNITY): Payer: Self-pay

## 2023-07-13 ENCOUNTER — Other Ambulatory Visit: Payer: Self-pay | Admitting: Cardiology

## 2023-07-13 ENCOUNTER — Encounter (HOSPITAL_COMMUNITY): Payer: Self-pay | Admitting: Registered Nurse

## 2023-07-13 ENCOUNTER — Encounter (HOSPITAL_COMMUNITY)
Admission: EM | Disposition: A | Discharge status: Home / Self Care | Payer: Self-pay | Source: Home / Self Care | Attending: Internal Medicine

## 2023-07-13 ENCOUNTER — Other Ambulatory Visit (HOSPITAL_COMMUNITY): Payer: BLUE CROSS/BLUE SHIELD

## 2023-07-13 DIAGNOSIS — I5043 Acute on chronic combined systolic (congestive) and diastolic (congestive) heart failure: Secondary | ICD-10-CM | POA: Diagnosis not present

## 2023-07-13 DIAGNOSIS — I4892 Unspecified atrial flutter: Secondary | ICD-10-CM | POA: Diagnosis not present

## 2023-07-13 DIAGNOSIS — I421 Obstructive hypertrophic cardiomyopathy: Secondary | ICD-10-CM | POA: Diagnosis not present

## 2023-07-13 LAB — BASIC METABOLIC PANEL
Anion gap: 10 (ref 5–15)
BUN: 30 mg/dL — ABNORMAL HIGH (ref 8–23)
CO2: 23 mmol/L (ref 22–32)
Calcium: 9 mg/dL (ref 8.9–10.3)
Chloride: 100 mmol/L (ref 98–111)
Creatinine, Ser: 1.48 mg/dL — ABNORMAL HIGH (ref 0.61–1.24)
GFR, Estimated: 51 mL/min — ABNORMAL LOW (ref >60)
Glucose, Bld: 98 mg/dL (ref 70–99)
Potassium: 3.9 mmol/L (ref 3.5–5.1)
Sodium: 133 mmol/L — ABNORMAL LOW (ref 135–145)

## 2023-07-13 SURGERY — ECHOCARDIOGRAM, TRANSESOPHAGEAL
Anesthesia: Monitor Anesthesia Care

## 2023-07-13 MED ORDER — EPLERENONE 50 MG PO TABS
50.0000 mg | ORAL_TABLET | Freq: Every day | ORAL | 1 refills | Status: DC
  Filled 2023-07-13: qty 30, 30d supply, fill #0

## 2023-07-13 MED ORDER — APIXABAN 5 MG PO TABS
5.0000 mg | ORAL_TABLET | Freq: Two times a day (BID) | ORAL | 1 refills | Status: DC
  Filled 2023-07-13: qty 60, 30d supply, fill #0

## 2023-07-13 MED ORDER — CARVEDILOL 25 MG PO TABS
25.0000 mg | ORAL_TABLET | Freq: Two times a day (BID) | ORAL | 1 refills | Status: DC
  Filled 2023-07-13 (×2): qty 60, 30d supply, fill #0

## 2023-07-13 MED ORDER — FUROSEMIDE 40 MG PO TABS: 40.0000 mg | ORAL_TABLET | ORAL | 0 refills | Status: DC | PRN

## 2023-07-13 NOTE — Progress Notes (Addendum)
Rounding Note    Patient Name: Craig Prince Date of Encounter: 07/13/2023  [facility] Cardiologist: Rollene Rotunda, MD   Subjective   Patient feeling better.  Not a significant difference however states that he does not chest pressure any longer.  Overall feeling great.  Inpatient Medications    Scheduled Meds:  apixaban  5 mg Oral BID   carvedilol  25 mg Oral BID WC   dapagliflozin propanediol  10 mg Oral Daily   sacubitril-valsartan  1 tablet Oral BID   Continuous Infusions:  PRN Meds: acetaminophen **OR** acetaminophen, albuterol, alum & mag hydroxide-simeth, diclofenac Sodium, simethicone, traZODone   Vital Signs    Vitals:   07/12/23 1926 07/12/23 2345 07/13/23 0545 07/13/23 0726  BP: 109/79 113/85 115/79 109/87  Pulse: 71 69 65 70  Resp: 15 16 18 16   Temp: 98.3 F (36.8 C) 97.8 F (36.6 C) 98.1 F (36.7 C) 97.9 F (36.6 C)  TempSrc: Oral Oral Oral Oral  SpO2: 98% 99% 97% 94%  Weight:   95.1 kg   Height:        Intake/Output Summary (Last 24 hours) at 07/13/2023 0818 Last data filed at 07/13/2023 0553 Gross per 24 hour  Intake 480 ml  Output 1130 ml  Net -650 ml      07/13/2023    5:45 AM 07/12/2023    5:30 AM 07/11/2023    6:43 AM  Last 3 Weights  Weight (lbs) 209 lb 9.6 oz 207 lb 8 oz 198 lb 13.7 oz  Weight (kg) 95.074 kg 94.121 kg 90.2 kg      Telemetry    Normal sinus rhythm heart rates 70s to 80s- Personally Reviewed  ECG    Normal sinus rhythm first-degree AV block, PR 230.   - Personally Reviewed  Physical Exam   GEN: No acute distress.   Neck: No JVD Cardiac: RRR, no murmurs, rubs, or gallops.  Respiratory: Clear to auscultation bilaterally. GI: Soft, nontender, non-distended  MS: No edema; No deformity. Neuro:  Nonfocal  Psych: Normal affect   Labs    High Sensitivity Troponin:   Recent Labs  Lab 07/09/23 0903 07/09/23 1019  TROPONINIHS 38* 40*     Chemistry Recent Labs  Lab 07/09/23 1019  07/09/23 1940 07/10/23 0125 07/11/23 0701 07/12/23 0056 07/13/23 0624  NA 140  --    < > 138 136 133*  K 3.9  --    < > 3.8 3.7 3.9  CL 108  --    < > 106 102 100  CO2 22  --    < > 21* 21* 23  GLUCOSE 102*  --    < > 114* 111* 98  BUN 24*  --    < > 23 30* 30*  CREATININE 1.32*  --    < > 1.53* 1.56* 1.48*  CALCIUM 9.0  --    < > 9.2 9.1 9.0  MG  --  1.8  --  1.8 2.2  --   PROT 7.0  --   --   --   --   --   ALBUMIN 4.0  --   --   --   --   --   AST 34  --   --   --   --   --   ALT 39  --   --   --   --   --   ALKPHOS 43  --   --   --   --   --  BILITOT 1.2  --   --   --   --   --   GFRNONAA 59*  --    < > 49* 48* 51*  ANIONGAP 10  --    < > 11 13 10    < > = values in this interval not displayed.    Lipids No results for input(s): "CHOL", "TRIG", "HDL", "LABVLDL", "LDLCALC", "CHOLHDL" in the last 168 hours.  Hematology Recent Labs  Lab 07/09/23 0902 07/10/23 0125  WBC 8.8 7.8  RBC 6.13* 6.39*  HGB 13.9 14.6  HCT 44.0 46.1  MCV 71.8* 72.1*  MCH 22.7* 22.8*  MCHC 31.6 31.7  RDW 18.8* 18.8*  PLT 154 163   Thyroid  Recent Labs  Lab 07/09/23 1940  TSH 1.092    BNP Recent Labs  Lab 07/09/23 0832  BNP 1,638.5*    DDimer  Recent Labs  Lab 07/09/23 0903  DDIMER 0.78*     Radiology    No results found.  Cardiac Studies   Echocardiogram 07/10/2023  1. Known Hypertrophic cardiomyopathy with evidence of LVOT obstruction.  Left ventricular ejection fraction, by estimation, is 50 to 55%. The left  ventricle has low normal function. The left ventricle has no regional wall  motion abnormalities. There is  severe concentric left ventricular hypertrophy. Left ventricular diastolic  parameters are indeterminate.   2. Right ventricular systolic function is normal. The right ventricular  size is normal.   3. Left atrial size was severely dilated.   4. Right atrial size was moderately dilated.   5. The mitral valve is normal in structure. Trivial mitral valve   regurgitation. No evidence of mitral stenosis.   6. The aortic valve is tricuspid. Aortic valve regurgitation is not  visualized. Aortic valve sclerosis is present, with no evidence of aortic  valve stenosis.   7. There is mild dilatation of the aortic root, measuring 41 mm. There is  moderate dilatation of the ascending aorta, measuring 42 mm.   8. The inferior vena cava is normal in size with greater than 50%  respiratory variability, suggesting right atrial pressure of 3 mmHg.   Comparison(s): No significant change from prior study.   Cardiac MRI 08/10/2022 FINDINGS: 1. Mildly increased left ventricular size, with LVEDD 66 mm, and LVEDVi 102 mL/m2.   Severe asymmetric septal hypertrophy, with intraventricular septal thickness of 22 mm, posterior wall thickness of 7 mm, and myocardial mass index of 94 g/m2.   Mildly reduced left ventricular systolic function (LVEF =40%). There is septal hypokinesis. There is no systolic anterior motion of the mitral valve.   Global longitudinal strain -6%. Qualitatively increased mechanical dispersion.   Left ventricular parametric mapping notable for normal native T1 and T2 signal.   There is late gadolinium enhancement in the left ventricular myocardium. There is patchy heterogeneous LGE in the septum (basal to apex) and inferior and anterior (mid to apex). Using a 6 standard deviation assessment, LGE quantification is 23.5% of total myocardium.   2. Normal right ventricular size with RVEDVI 79 mL/m2.   Normal right ventricular thickness.   Low normal right ventricular systolic function (RVEF =45%). There is septal hypokinesis.   3.  Normal left and right atrial size.   4.  Mild dilation of the ascending aorta: 42 mm.   Moderate dilation of the main pulmonary artery, with diameter of 35 mm but with trunk to aorta ratio of 0.8.   5. Valve assessment:   Aortic Valve: Qualitatively, there is no significant  regurgitation.    Pulmonic Valve: Qualitatively, there is no significant regurgitation.   Tricuspid Valve: Qualitatively, there is no significant regurgitation.   Mitral Valve: Mild central regurgitation.   6.  Normal pericardium.  No pericardial effusion.   7. Grossly, no extracardiac findings. Recommended dedicated study if concerned for non-cardiac pathology.   IMPRESSION: Study suggestive of hypertrophic cardiomyopathy. No LVOT obstructive demonstrated in this study.   LVEF 40%, with septal thickness of 22 mm and LGE quantification of 23.5%.   Compared to prior, LVEF is slightly decrease and septal thickness is slightly increased. LGE is extensive.   Riley Lam MD   Patient Profile     67 y.o. male non-obstructive CAD on cardiac catheterization in 06/2009 and normal coronaries on coronary CTA in 11/2016, non-ischemic cardiomyopathy with EF as low as 25% in the remote past but has since normalized (50-55% in 10/2022), cardiomyopathy without LVOT obstruction, mild dilation of ascending aorta (4.2 cm on cardiac MRI in 02/2022), hypertension, and hyperlipidemia.  Patient was admitted on 07/09/2019 for further evaluation of atrial flutter.  Assessment & Plan    Acute on chronic combined HFrEF Nonischemic cardiomyopathy Hypertrophic cardiomyopathy Has history of nonischemic cardiomyopathy with EF as low as 25%.  EF has normalized since then.  Has had previous CTA in 2018 that showed CAC score of 0 and normal coronary arteries.  Most recent cardiac MRI in April 2023 showed EF of 40% with findings suggestive of hypertrophic cardiomyopathy.  His echocardiogram during this admission showed normalized EF 50 to 55% with evidence of LVOT obstruction.  Gradient not given. Previous echocardiogram in December 2023 did not show LVOT obstruction. He had severe concentric LVH. Continue Entresto 97-103 mg twice daily, carvedilol 50 mg twice daily, Farxiga 10 mg daily.  Did not tolerate spironolactone due  to breast tenderness.  Can consider eplerenone. Outpatient screening has been discussed with patient for his children. Volume status has not been a significant issue during this admission.  Can discharge on 40 mg as needed. Previously deferred ICD. Likely can discharge  New onset atrial fibrillation Self converted to NSR yesterday 07/12/2023 at approximately 1600.  Patient feeling much better now with no chest pressure. Continue carvedilol 50 mg twice daily.  Continue Eliquis 5 mg twice daily  Dilated ascending aorta On echocardiogram this admission there is mild dilatation of the aortic root measuring 41 mm and moderate dilatation of ascending aorta measuring 42 mm.  Continue to monitor this outpatient.  Appears stable.  First-degree AV block.   Continue to monitor.  PR 230.  For questions or updates, please contact [facility] Please consult [email or web address] for contact info under        Signed, Abagail Kitchens, PA-C  07/13/2023, 8:18 AM

## 2023-07-13 NOTE — Discharge Summary (Signed)
Physician Discharge Summary  Craig Prince:[id number] DOB: [birth year]  PCP: Charlane Ferretti, DO  Admitted from: Home Discharged to: Home  Admit date: 07/09/2023 Discharge date: 07/13/2023  Recommendations for Outpatient Follow-up:    Follow-up Information     Ronney Asters, NP Follow up.   Specialty: Cardiology Why: Monday Jul 25, 2023 Appt at 10:55 AM (25 min)  Additionally you have lab work to complete in one week. Please call if you have questions on how to complete this Contact information: [address] [place] [address] [phone]         Charlane Ferretti, DO Follow up in 1 week(s).   Specialty: Internal Medicine Why: To be seen with repeat labs (CBC & BMP). Contact information: [address] [place] [address] [phone]                  Home Health: None    Equipment/Devices: None    Discharge Condition: Improved and stable.   Code Status: Full Code Diet recommendation:  Discharge Diet Orders (From admission, onward)     Start     Ordered   07/13/23 0000  Diet - low sodium heart healthy        07/13/23 1659             Discharge Diagnoses:  Principal Problem:   Acute on chronic combined systolic and diastolic CHF (congestive heart failure) (HCC) Active Problems:   Atrial flutter (HCC)   Essential hypertension   AKI (acute kidney injury) (HCC)   Non Obstructive CAD by cath 06/2009   Brief Summary: 67 year old male with PMH of CAD s/p cardiac cath 06/2009 and normal coronaries on coronary CTA in 11/2016, NICM with EF low at 25% in the remote past which had since normalized to 50-55% in 10/2022, cardiomyopathy without LVOT, mild dilatation of ascending aorta, chronic diastolic CHF, HTN, HLD gout presented to the ED on 07/09/2023 with complaints of rapidly worsening dyspnea on exertion for about 24 hours.  He was admitted for acute on chronic combined CHF and new onset A-fib.  Cardiology consulted and assisted with  management.  Assessment and plan:  Acute on chronic combined CHF Nonischemic cardiomyopathy Hypertrophic cardiomyopathy History as noted above. Most recent cardiac MRI in April 2023 showed EF of 40% with findings suggestive of hypertrophic cardiomyopathy TTE 07/10/2023: Known hypertrophic cardiomyopathy with evidence of LVOT obstruction.  LVEF 50-55%.  Severe concentric LVH.  Mild dilatation of aortic root measuring 41 mm, moderate dilatation of ascending aorta, measuring 42 mm. Treated over 2 days with several doses of IV Lasix with clinical improvement. -4.1 L and weight down by approximately 12 pounds since admission.  Clinically euvolemic. As per cardiology follow-up today, cleared for discharge.  Verified with cardiology APP Yvonna Alanis this afternoon and confirms following discharge meds: Carvedilol 25 Mg twice daily, Farxiga 10 Mg daily, Inspra 50 Mg daily, Entresto 97-103 mg, 1 tablet twice daily.  Did not tolerate spironolactone due to breast tenderness.  Per cardiology, volume status has not been a significant issue during this admission.  At time of discharge, had not noticed the as needed Lasix that was recommended by cardiology and hence was not ordered.  Patient's meds had been filled by the [facility] pharmacy.  Now ordered Lasix 40 mg as needed for weight gain and forwarded it to his community pharmacy.  Attempted to reach patient and listed son but unable.  Will continue to try to reach them. As per cardiology, previously  deferred ICD. Cardiology has outpatient follow-up arranged.  New onset atrial fibrillation with RVR: Self converted to normal sinus rhythm on 8/13.  He therefore did not require cardioversion which was being considered. Continue carvedilol 25 Mg twice daily, Eliquis 5 Mg twice daily.  Dilated ascending aorta: Dimensions as noted above in TTE. Continue to monitor as outpatient.  Essential hypertension: Controlled.  Continue carvedilol and  Entresto.  Hypokalemia Replaced  Acute kidney injury: Recent baseline creatinine not known.  Most recent creatinine was 1.15 on 08/04/2022.  Presented with creatinine of 1.32 which peaked to 1.56 but is downtrending again. Likely due to IV Lasix diuresis, CT contrast or cardiorenal. Follow BMP closely as outpatient.   Consultations: Cardiology  Procedures: None   Discharge Instructions  Discharge Instructions     (HEART FAILURE PATIENTS) Call MD:  Anytime you have any of the following symptoms: 1) 3 pound weight gain in 24 hours or 5 pounds in 1 week 2) shortness of breath, with or without a dry hacking cough 3) swelling in the hands, feet or stomach 4) if you have to sleep on extra pillows at night in order to breathe.   Complete by: As directed    Call MD for:  difficulty breathing, headache or visual disturbances   Complete by: As directed    Call MD for:  extreme fatigue   Complete by: As directed    Call MD for:  persistant dizziness or light-headedness   Complete by: As directed    Call MD for:  persistant nausea and vomiting   Complete by: As directed    Call MD for:  severe uncontrolled pain   Complete by: As directed    Call MD for:  temperature >100.4   Complete by: As directed    Diet - low sodium heart healthy   Complete by: As directed    Increase activity slowly   Complete by: As directed         Medication List     TAKE these medications    acetaminophen 325 MG tablet Commonly known as: TYLENOL Take 2 tablets (650 mg total) by mouth every 4 (four) hours as needed for headache or mild pain.   allopurinol 100 MG tablet Commonly known as: ZYLOPRIM Take 100 mg by mouth daily.   carvedilol 25 MG tablet Commonly known as: COREG Take 1 tablet (25 mg total) by mouth 2 (two) times daily with a meal. What changed: how much to take   dapagliflozin propanediol 10 MG Tabs tablet Commonly known as: Farxiga Take 1 tablet (10 mg total) by mouth daily.    Eliquis 5 MG Tabs tablet Generic drug: apixaban Take 1 tablet (5 mg total) by mouth 2 (two) times daily.   eplerenone 50 MG tablet Commonly known as: INSPRA Take 1 tablet (50 mg total) by mouth daily.   furosemide 40 MG tablet Commonly known as: Lasix Take 1 tablet (40 mg total) by mouth as needed for fluid or edema (For 3 pound weight gain in 24 hours or 5 pounds in 1 week).   ondansetron 4 MG disintegrating tablet Commonly known as: ZOFRAN-ODT Take 1 tablet (4 mg total) by mouth every 8 (eight) hours as needed for nausea or vomiting.   sacubitril-valsartan 97-103 MG Commonly known as: ENTRESTO Take 1 tablet by mouth 2 (two) times daily.       Allergies  Allergen Reactions   Spironolactone Other (See Comments)    Breast tenderness      Procedures/Studies: ECHOCARDIOGRAM  COMPLETE  Result Date: 07/10/2023    ECHOCARDIOGRAM REPORT   Patient Name:   GAVEN RISKO Date of Exam: 07/10/2023 Medical Rec #:  [id number]     Height:       71.0 in Accession #:    [id number]    Weight:       202.5 lb Date of Birth:  June 12, 1956     BSA:          2.120 m Patient Age:    67 years      BP:           136/95 mmHg Patient Gender: M             HR:           104 bpm. Exam Location:  Inpatient Procedure: 2D Echo, Color Doppler and Cardiac Doppler Indications:    Atrial Flutter  History:        Patient has prior history of Echocardiogram examinations, most                 recent 11/03/2022. CHF and Hypertrophic Cardiomyopathy, CAD,                 Arrythmias:Atrial Flutter; Risk Factors:Hypertension. Family                 History of Coronary Artery Disease, Non Ischemic Cardiomyopathy,                 Aortic root enlargement.  Sonographer:    Raeford Razor RDCS Referring Phys: [id number] ALLISON WOLFE IMPRESSIONS  1. Known Hypertrophic cardiomyopathy with evidence of LVOT obstruction. Left ventricular ejection fraction, by estimation, is 50 to 55%. The left ventricle has low normal function. The left ventricle  has no regional wall motion abnormalities. There is severe concentric left ventricular hypertrophy. Left ventricular diastolic parameters are indeterminate.  2. Right ventricular systolic function is normal. The right ventricular size is normal.  3. Left atrial size was severely dilated.  4. Right atrial size was moderately dilated.  5. The mitral valve is normal in structure. Trivial mitral valve regurgitation. No evidence of mitral stenosis.  6. The aortic valve is tricuspid. Aortic valve regurgitation is not visualized. Aortic valve sclerosis is present, with no evidence of aortic valve stenosis.  7. There is mild dilatation of the aortic root, measuring 41 mm. There is moderate dilatation of the ascending aorta, measuring 42 mm.  8. The inferior vena cava is normal in size with greater than 50% respiratory variability, suggesting right atrial pressure of 3 mmHg. Comparison(s): No significant change from prior study. FINDINGS  Left Ventricle: Known Hypertrophic cardiomyopathy with evidence of LVOT obstruction. Left ventricular ejection fraction, by estimation, is 50 to 55%. The left ventricle has low normal function. The left ventricle has no regional wall motion abnormalities. The left ventricular internal cavity size was normal in size. There is severe concentric left ventricular hypertrophy. Left ventricular diastolic parameters are indeterminate. Right Ventricle: The right ventricular size is normal. No increase in right ventricular wall thickness. Right ventricular systolic function is normal. Left Atrium: Left atrial size was severely dilated. Right Atrium: Right atrial size was moderately dilated. Pericardium: There is no evidence of pericardial effusion. Presence of epicardial fat layer. Mitral Valve: The mitral valve is normal in structure. Trivial mitral valve regurgitation. No evidence of mitral valve stenosis. Tricuspid Valve: The tricuspid valve is normal in structure. Tricuspid valve regurgitation  is mild . No evidence of tricuspid stenosis. Aortic Valve: The  aortic valve is tricuspid. Aortic valve regurgitation is not visualized. Aortic valve sclerosis is present, with no evidence of aortic valve stenosis. Aortic valve peak gradient measures 4.7 mmHg. Pulmonic Valve: The pulmonic valve was normal in structure. Pulmonic valve regurgitation is trivial. No evidence of pulmonic stenosis. Aorta: There is mild dilatation of the aortic root, measuring 41 mm. There is moderate dilatation of the ascending aorta, measuring 42 mm. Venous: The inferior vena cava is normal in size with greater than 50% respiratory variability, suggesting right atrial pressure of 3 mmHg. IAS/Shunts: No atrial level shunt detected by color flow Doppler.  LEFT VENTRICLE PLAX 2D LVIDd:         4.50 cm LVIDs:         3.30 cm LV PW:         2.20 cm LV IVS:        2.20 cm LVOT diam:     2.30 cm LV SV:         60 LV SV Index:   28 LVOT Area:     4.15 cm  RIGHT VENTRICLE          IVC RV Basal diam:  2.80 cm  IVC diam: 2.60 cm TAPSE (M-mode): 2.1 cm LEFT ATRIUM              Index        RIGHT ATRIUM           Index LA diam:        4.70 cm  2.22 cm/m   RA Area:     23.30 cm LA Vol (A2C):   170.0 ml 80.20 ml/m  RA Volume:   69.80 ml  32.93 ml/m LA Vol (A4C):   161.0 ml 75.95 ml/m LA Biplane Vol: 177.0 ml 83.50 ml/m  AORTIC VALVE AV Area (Vmax): 3.63 cm AV Vmax:        108.80 cm/s AV Peak Grad:   4.7 mmHg LVOT Vmax:      95.03 cm/s LVOT Vmean:     62.367 cm/s LVOT VTI:       0.144 m  AORTA Ao Root diam: 4.00 cm Ao Asc diam:  4.20 cm MITRAL VALVE               TRICUSPID VALVE MV Area (PHT): 4.26 cm    TR Peak grad:   8.4 mmHg MV Decel Time: 178 msec    TR Vmax:        145.00 cm/s MV E velocity: 63.83 cm/s                            SHUNTS                            Systemic VTI:  0.14 m                            Systemic Diam: 2.30 cm Kardie Tobb DO Electronically signed by Thomasene Ripple DO Signature Date/Time: 07/10/2023/1:47:29 PM    Final     CT Angio Chest PE W/Cm &/Or Wo Cm  Result Date: 07/09/2023 CLINICAL DATA:  Pulmonary embolism (PE) suspected, low to intermediate prob, positive D-dimer EXAM: CT ANGIOGRAPHY CHEST WITH CONTRAST TECHNIQUE: Multidetector CT imaging of the chest was performed using the standard protocol during bolus administration of intravenous contrast. Multiplanar CT image reconstructions and MIPs  were obtained to evaluate the vascular anatomy. RADIATION DOSE REDUCTION: This exam was performed according to the departmental dose-optimization program which includes automated exposure control, adjustment of the mA and/or kV according to patient size and/or use of iterative reconstruction technique. CONTRAST:  OMNIPAQUE IOHEXOL 350 MG/ML SOLN COMPARISON:  04/22/2022 FINDINGS: Cardiovascular: SVC patent. Mild cardiomegaly with left atrial enlargement. Dilated central pulmonary arteries suggesting pulmonary hypertension. Satisfactory opacification of pulmonary arteries noted, and there is no evidence of pulmonary emboli. Mild scattered LAD coronary calcifications. Fair contrast opacification of the thoracic aorta with no evidence of dissection, aneurysm, or stenosis. There is bovine variant brachiocephalic arch anatomy without proximal stenosis. Scattered calcified atheromatous plaque in the arch and descending thoracic aorta. Mediastinum/Nodes: No mediastinal mass, hematoma, or adenopathy. Lungs/Pleura: Trace right pleural effusion has developed. No pneumothorax. Prominent septal lines per peripherally in both lung bases. No airspace disease or worrisome lesion. Upper Abdomen: No acute findings. Musculoskeletal: Spondylitic changes in the lower cervical spine. Vertebral endplate spurring at multiple levels in the mid and lower thoracic spine. Review of the MIP images confirms the above findings. IMPRESSION: 1. Negative for acute PE or thoracic aortic dissection. 2.  Cardiomegaly with left atrial enlargement. 3. Dilated  central pulmonary arteries suggesting pulmonary hypertension. 4. Trace right pleural effusion. 5.  Aortic Atherosclerosis (ICD10-I70.0). Electronically Signed   By: Corlis Leak M.D.   On: 07/09/2023 12:12   DG Chest 2 View  Result Date: 07/09/2023 CLINICAL DATA:  Dyspnea, CHF EXAM: CHEST - 2 VIEW COMPARISON:  04/22/2022 chest radiograph. FINDINGS: Stable cardiomediastinal silhouette with mild to moderate cardiomegaly. No pneumothorax. No pleural effusion. Mild pulmonary edema. No acute consolidative airspace disease. IMPRESSION: Mild congestive heart failure. Electronically Signed   By: Delbert Phenix M.D.   On: 07/09/2023 08:32      Subjective: Seen this afternoon.  Denies complaints.  No chest pain, dyspnea, palpitations, dizziness or lightheadedness.  Discharge Exam:  Vitals:   07/13/23 0545 07/13/23 0726 07/13/23 1625 07/13/23 1651  BP: 115/79 109/87 109/84   Pulse: 65 70    Resp: 18 16 16    Temp: 98.1 F (36.7 C) 97.9 F (36.6 C)  98.6 F (37 C)  TempSrc: Oral Oral  Oral  SpO2: 97% 94%    Weight: 95.1 kg     Height:        General: Young male, moderately built and nourished lying comfortably supine in bed without distress. Cardiovascular: S1 & S2 heard, irregular irregular, S1/S2 +. No murmurs, rubs, gallops or clicks. No JVD or pedal edema. Respiratory: Clear to auscultation without wheezing, rhonchi or crackles. No increased work of breathing. Abdominal:  Non distended, non tender & soft. No organomegaly or masses appreciated. Normal bowel sounds heard. CNS: Alert and oriented. No focal deficits. Extremities: no edema, no cyanosis    The results of significant diagnostics from this hospitalization (including imaging, microbiology, ancillary and laboratory) are listed below for reference.     Microbiology: No results found for this or any previous visit (from the past 240 hour(s)).   Labs: CBC: Recent Labs  Lab 07/09/23 0902 07/10/23 0125  WBC 8.8 7.8  NEUTROABS  6.9  --   HGB 13.9 14.6  HCT 44.0 46.1  MCV 71.8* 72.1*  PLT 154 163    Basic Metabolic Panel: Recent Labs  Lab 07/09/23 1019 07/09/23 1940 07/10/23 0125 07/11/23 0701 07/12/23 0056 07/13/23 0624  NA 140  --  139 138 136 133*  K 3.9  --  3.3* 3.8  3.7 3.9  CL 108  --  103 106 102 100  CO2 22  --  23 21* 21* 23  GLUCOSE 102*  --  81 114* 111* 98  BUN 24*  --  18 23 30* 30*  CREATININE 1.32*  --  1.31* 1.53* 1.56* 1.48*  CALCIUM 9.0  --  9.0 9.2 9.1 9.0  MG  --  1.8  --  1.8 2.2  --     Liver Function Tests: Recent Labs  Lab 07/09/23 1019  AST 34  ALT 39  ALKPHOS 43  BILITOT 1.2  PROT 7.0  ALBUMIN 4.0     Urinalysis    Component Value Date/Time   COLORURINE YELLOW 07/09/2023 0832   APPEARANCEUR CLEAR 07/09/2023 0832   LABSPEC >=1.030 07/09/2023 0832   PHURINE 5.5 07/09/2023 0832   GLUCOSEU 250 (A) 07/09/2023 0832   HGBUR SMALL (A) 07/09/2023 0832   BILIRUBINUR NEGATIVE 07/09/2023 0832   KETONESUR NEGATIVE 07/09/2023 0832   PROTEINUR 100 (A) 07/09/2023 0832   UROBILINOGEN 0.2 07/26/2009 2221   NITRITE NEGATIVE 07/09/2023 0832   LEUKOCYTESUR NEGATIVE 07/09/2023 9629      Time coordinating discharge: Less than 30 minutes  SIGNED:  Marcellus Scott, MD,  FACP, FHM, [facility], [facility], [facility]   [facility] Advisor [place]     To contact the attending provider between 7A-7P or the covering provider during after hours 7P-7A, please log into the web site [email or web address] and access using universal [place] password for that web site. If you do not have the password, please call the hospital operator.

## 2023-07-13 NOTE — Progress Notes (Signed)
Check BMP given new addition of epleronone.

## 2023-07-13 NOTE — Plan of Care (Signed)
  Problem: Education: Goal: Knowledge of General Education information will improve Description: Including pain rating scale, medication(s)/side effects and non-pharmacologic comfort measures Outcome: Progressing   Problem: Health Behavior/Discharge Planning: Goal: Ability to manage health-related needs will improve Outcome: Progressing   Problem: Clinical Measurements: Goal: Ability to maintain clinical measurements within normal limits will improve Outcome: Progressing Goal: Will remain free from infection Outcome: Progressing Goal: Diagnostic test results will improve Outcome: Progressing Goal: Cardiovascular complication will be avoided Outcome: Progressing   Problem: Activity: Goal: Risk for activity intolerance will decrease Outcome: Progressing   Problem: Nutrition: Goal: Adequate nutrition will be maintained Outcome: Progressing   Problem: Elimination: Goal: Will not experience complications related to bowel motility Outcome: Progressing Goal: Will not experience complications related to urinary retention Outcome: Progressing   Problem: Safety: Goal: Ability to remain free from injury will improve Outcome: Progressing   Problem: Skin Integrity: Goal: Risk for impaired skin integrity will decrease Outcome: Progressing

## 2023-07-13 NOTE — Plan of Care (Signed)
Problem: Education: Goal: Knowledge of General Education information will improve Description: Including pain rating scale, medication(s)/side effects and non-pharmacologic comfort measures 07/13/2023 1709 by Georgiann Mccoy, RN Outcome: Adequate for Discharge 07/13/2023 0841 by Georgiann Mccoy, RN Outcome: Progressing   Problem: Health Behavior/Discharge Planning: Goal: Ability to manage health-related needs will improve 07/13/2023 1709 by Georgiann Mccoy, RN Outcome: Adequate for Discharge 07/13/2023 0841 by Georgiann Mccoy, RN Outcome: Progressing   Problem: Clinical Measurements: Goal: Ability to maintain clinical measurements within normal limits will improve 07/13/2023 1709 by Georgiann Mccoy, RN Outcome: Adequate for Discharge 07/13/2023 0841 by Georgiann Mccoy, RN Outcome: Progressing Goal: Will remain free from infection 07/13/2023 1709 by Georgiann Mccoy, RN Outcome: Adequate for Discharge 07/13/2023 0841 by Georgiann Mccoy, RN Outcome: Progressing Goal: Diagnostic test results will improve 07/13/2023 1709 by Georgiann Mccoy, RN Outcome: Adequate for Discharge 07/13/2023 0841 by Georgiann Mccoy, RN Outcome: Progressing Goal: Respiratory complications will improve 07/13/2023 1709 by Georgiann Mccoy, RN Outcome: Adequate for Discharge 07/13/2023 0841 by Georgiann Mccoy, RN Outcome: Progressing Goal: Cardiovascular complication will be avoided 07/13/2023 1709 by Georgiann Mccoy, RN Outcome: Adequate for Discharge 07/13/2023 0841 by Georgiann Mccoy, RN Outcome: Progressing   Problem: Activity: Goal: Risk for activity intolerance will decrease 07/13/2023 1709 by Georgiann Mccoy, RN Outcome: Adequate for Discharge 07/13/2023 0841 by Georgiann Mccoy, RN Outcome: Progressing   Problem: Nutrition: Goal: Adequate nutrition will be maintained 07/13/2023 1709 by Georgiann Mccoy, RN Outcome: Adequate for Discharge 07/13/2023 0841 by Georgiann Mccoy, RN Outcome:  Progressing   Problem: Coping: Goal: Level of anxiety will decrease 07/13/2023 1709 by Georgiann Mccoy, RN Outcome: Adequate for Discharge 07/13/2023 0841 by Georgiann Mccoy, RN Outcome: Progressing   Problem: Elimination: Goal: Will not experience complications related to bowel motility 07/13/2023 1709 by Georgiann Mccoy, RN Outcome: Adequate for Discharge 07/13/2023 0841 by Georgiann Mccoy, RN Outcome: Progressing Goal: Will not experience complications related to urinary retention 07/13/2023 1709 by Georgiann Mccoy, RN Outcome: Adequate for Discharge 07/13/2023 0841 by Georgiann Mccoy, RN Outcome: Progressing   Problem: Pain Managment: Goal: General experience of comfort will improve 07/13/2023 1709 by Georgiann Mccoy, RN Outcome: Adequate for Discharge 07/13/2023 0841 by Georgiann Mccoy, RN Outcome: Progressing   Problem: Safety: Goal: Ability to remain free from injury will improve 07/13/2023 1709 by Georgiann Mccoy, RN Outcome: Adequate for Discharge 07/13/2023 0841 by Georgiann Mccoy, RN Outcome: Progressing   Problem: Skin Integrity: Goal: Risk for impaired skin integrity will decrease 07/13/2023 1709 by Georgiann Mccoy, RN Outcome: Adequate for Discharge 07/13/2023 0841 by Georgiann Mccoy, RN Outcome: Progressing   Problem: Education: Goal: Knowledge of disease or condition will improve 07/13/2023 1709 by Georgiann Mccoy, RN Outcome: Adequate for Discharge 07/13/2023 0841 by Georgiann Mccoy, RN Outcome: Progressing Goal: Understanding of medication regimen will improve 07/13/2023 1709 by Georgiann Mccoy, RN Outcome: Adequate for Discharge 07/13/2023 0841 by Georgiann Mccoy, RN Outcome: Progressing Goal: Individualized Educational Video(s) 07/13/2023 1709 by Georgiann Mccoy, RN Outcome: Adequate for Discharge 07/13/2023 0841 by Georgiann Mccoy, RN Outcome: Progressing   Problem: Activity: Goal: Ability to tolerate increased activity will  improve 07/13/2023 1709 by Georgiann Mccoy, RN Outcome: Adequate for Discharge 07/13/2023 0841 by Georgiann Mccoy, RN Outcome: Progressing   Problem: Cardiac: Goal: Ability to achieve and maintain adequate cardiopulmonary perfusion will improve 07/13/2023 1709 by  Georgiann Mccoy, RN Outcome: Adequate for Discharge 07/13/2023 0841 by Georgiann Mccoy, RN Outcome: Progressing   Problem: Health Behavior/Discharge Planning: Goal: Ability to safely manage health-related needs after discharge will improve 07/13/2023 1709 by Georgiann Mccoy, RN Outcome: Adequate for Discharge 07/13/2023 0841 by Georgiann Mccoy, RN Outcome: Progressing

## 2023-07-13 NOTE — Plan of Care (Signed)

## 2023-07-14 NOTE — Care Management (Signed)
4010 07-14-23 Case Manager spoke with patient this morning regarding Lasix Rx. Patient is aware and agreeable to pick up Lasix Rx from [facility] this morning. No further needs identified at this time.

## 2023-07-22 NOTE — Progress Notes (Deleted)
Cardiology Clinic Note   Patient Name: [name] Date of Encounter: 07/22/2023  Primary Care Provider:  Charlane Ferretti, DO Primary Cardiologist:  Craig Rotunda, MD  Patient Profile     Craig Prince 67 year old male presents to the clinic today for follow-up evaluation of his essential hypertension and nonischemic cardiomyopathy.  Past Medical History    Past Medical History:  Diagnosis Date   Arthritis    CAD (coronary artery disease)    NONOBSTRUCTIVE  a. cath 8/10: prox to mid LAD 20%   Chronic systolic heart failure (HCC)    Essential hypertension, benign    Family history of breast cancer    Family history of prostate cancer    Gout    Gout    Hypertrophic nonobstructive cardiomyopathy (HCC)    septal hypertrophy without LVOT   NICM (nonischemic cardiomyopathy) (HCC)    a. echo 8/10: EF20-25%, mod LAE, severe asymmetric septal hypertrophy (? nonobs. HCM); grade 1 diast dyfxn.  EF 55% echo June 6   Past Surgical History:  Procedure Laterality Date   KNEE ARTHROSCOPY     right    Allergies  Allergies  Allergen Reactions   Spironolactone Other (See Comments)    Breast tenderness    History of Present Illness     Craig Prince has a PMH of cardiomyopathy without LVOT obstruction, mild dilation of ascending aorta 42 mm on cardiac MRI 4/23, hypertension, and hyperlipidemia.  He was admitted 07/09/2023 and discharged on 07/12/2023.  Cardiology was consulted for evaluation of new onset atrial fibrillation.  His echocardiogram showed normalized EF of 50-55% with no evidence of LVOT obstruction.  His GDMT was continued.  He previously did not tolerate spironolactone due to breast tenderness.  He was started on eplerenone at discharge.  Screening for his children outpatient was recommended.  He previously deferred ICD implantation.  He spontaneously converted to sinus rhythm.  He denied chest pain on inpatient follow-up.  He was continued on carvedilol and  apixaban.  He presents to the clinic today for follow-up evaluation and states***.  *** denies chest pain, shortness of breath, lower extremity edema, fatigue, palpitations, melena, hematuria, hemoptysis, diaphoresis, weakness, presyncope, syncope, orthopnea, and PND.  New onset atrial fibrillation-denies episodes of accelerated or irregular heartbeat.  Denies palpitations.  Was noted to have new onset atrial fibrillation during recent hospital admission.  Spontaneously converted to sinus rhythm on 8/13.  Reports compliance with apixaban.  Denies bleeding issues. Continue carvedilol, apixaban Avoid triggers caffeine, chocolate, EtOH, dehydration etc.  Nonischemic cardiomyopathy, hypertrophic cardiomyopathy, acute on chronic combined HFrEF-euvolemic today.  Recent echocardiogram showed EF of 50-55%.  Per the study did not tolerate spironolactone due to breast tenderness. Continue GDMT Heart healthy low-sodium diet Daily weights Elevate lower extremities when not active  First-degree AV block-denies episodes of presyncope, syncope, and lightheadedness.  Heart rate today***. Continue to monitor  Ascending aortic dilation-denies chest and back discomfort.  8 group noted to be 41 mm on echocardiogram and ascending aorta measured 42 mm. Maintain good blood pressure control Continue current medical therapy Plan for repeat echocardiogram annually  Disposition: Follow-up with Dr. Antoine Prince or me in 4 to 6 months.  Home Medications    Prior to Admission medications   Medication Sig Start Date End Date Taking? Authorizing Provider  acetaminophen (TYLENOL) 325 MG tablet Take 2 tablets (650 mg total) by mouth every 4 (four) hours as needed for headache or mild pain. 12/29/16   Craig Derrick, PA-C  allopurinol (ZYLOPRIM) 100 MG tablet Take 100 mg by mouth daily. 04/21/21   [provider]  apixaban (ELIQUIS) 5 MG TABS tablet Take 1 tablet (5 mg total) by mouth 2 (two) times daily. 07/13/23    Craig Prince Greenland, MD  carvedilol (COREG) 25 MG tablet Take 1 tablet (25 mg total) by mouth 2 (two) times daily with a meal. 07/13/23   Craig Prince Greenland, MD  dapagliflozin propanediol (FARXIGA) 10 MG TABS tablet Take 1 tablet (10 mg total) by mouth daily. 05/30/23   Craig Rotunda, MD  eplerenone (INSPRA) 50 MG tablet Take 1 tablet (50 mg total) by mouth daily. 07/13/23   Craig Prince Greenland, MD  furosemide (LASIX) 40 MG tablet Take 1 tablet (40 mg total) by mouth as needed for fluid or edema (For 3 pound weight gain in 24 hours or 5 pounds in 1 week). 07/13/23 07/12/24  Craig Prince Greenland, MD  ondansetron (ZOFRAN-ODT) 4 MG disintegrating tablet Take 1 tablet (4 mg total) by mouth every 8 (eight) hours as needed for nausea or vomiting. 06/07/23   Craig Prince, Brooke L, PA  sacubitril-valsartan (ENTRESTO) 97-103 MG Take 1 tablet by mouth 2 (two) times daily. 05/30/23   Craig Rotunda, MD  colchicine 0.6 MG tablet Take 1 tablet (0.6 mg total) by mouth daily. 09/04/20 02/11/21  Craig Laine, MD    Family History    Family History  Problem Relation Age of Onset   Kidney disease Brother    Cancer Brother        prostate   Kidney disease Father    Diabetes Mother    Heart attack Mother 71   Heart disease Mother    Cancer Sister        breast   CAD Brother 69       60% blockage   Breast cancer Sister 59   Breast cancer Sister 72       PALB2 pos   Healthy Son    Healthy Daughter    He indicated that his mother is deceased. He indicated that his father is deceased. He indicated that all of his three sisters are alive. He indicated that both of his brothers are alive. He indicated that his maternal grandmother is deceased. He indicated that his maternal grandfather is deceased. He indicated that his paternal grandmother is deceased. He indicated that his paternal grandfather is deceased. He indicated that his daughter is alive. He indicated that his son is alive.  Social History    Social History    Socioeconomic History   Marital status: Married    Spouse name: Not on file   Number of children: Not on file   Years of education: Not on file   Highest education level: Not on file  Occupational History   Not on file  Tobacco Use   Smoking status: Never   Smokeless tobacco: Never  Substance and Sexual Activity   Alcohol use: No    Alcohol/week: 0.0 standard drinks of alcohol   Drug use: No   Sexual activity: Not on file  Other Topics Concern   Not on file  Social History Narrative   Not on file   Social Determinants of Health   Financial Resource Strain: Not on file  Food Insecurity: No Food Insecurity (07/09/2023)   Hunger Vital Sign    Worried About Running Out of Food in the Last Year: Never true    Ran Out of Food in the Last Year: Never true  Transportation Needs:  No Transportation Needs (07/09/2023)   PRAPARE - Administrator, Civil Service (Medical): No    Lack of Transportation (Non-Medical): No  Physical Activity: Not on file  Stress: Not on file  Social Connections: Not on file  Intimate Partner Violence: Not At Risk (07/09/2023)   Humiliation, Afraid, Rape, and Kick questionnaire    Fear of Current or Ex-Partner: No    Emotionally Abused: No    Physically Abused: No    Sexually Abused: No     Review of Systems    General:  No chills, fever, night sweats or weight changes.  Cardiovascular:  No chest pain, dyspnea on exertion, edema, orthopnea, palpitations, paroxysmal nocturnal dyspnea. Dermatological: No rash, lesions/masses Respiratory: No cough, dyspnea Urologic: No hematuria, dysuria Abdominal:   No nausea, vomiting, diarrhea, bright red blood per rectum, melena, or hematemesis Neurologic:  No visual changes, wkns, changes in mental status. All other systems reviewed and are otherwise negative except as noted above.  Physical Exam    VS:  There were no vitals taken for this visit. , BMI There is no height or weight on file to  calculate BMI. GEN: Well nourished, well developed, in no acute distress. HEENT: normal. Neck: Supple, no JVD, carotid bruits, or masses. Cardiac: RRR, no murmurs, rubs, or gallops. No clubbing, cyanosis, edema.  Radials/DP/PT 2+ and equal bilaterally.  Respiratory:  Respirations regular and unlabored, clear to auscultation bilaterally. GI: Soft, nontender, nondistended, BS + x 4. MS: no deformity or atrophy. Skin: warm and dry, no rash. Neuro:  Strength and sensation are intact. Psych: Normal affect.  Accessory Clinical Findings    Recent Labs: 07/09/2023: ALT 39; B Natriuretic Peptide 1,638.5; TSH 1.092 07/10/2023: Hemoglobin 14.6; Platelets 163 07/12/2023: Magnesium 2.2 07/13/2023: BUN 30; Creatinine, Ser 1.48; Potassium 3.9; Sodium 133   Recent Lipid Panel    Component Value Date/Time   CHOL  07/27/2009 0500    193        ATP III CLASSIFICATION:  <200     mg/dL   Desirable  629-528  mg/dL   Borderline High  >=413    mg/dL   High          TRIG 77 07/27/2009 0500   HDL 35 (Prince) 07/27/2009 0500   CHOLHDL 5.5 07/27/2009 0500   VLDL 15 07/27/2009 0500   LDLCALC (H) 07/27/2009 0500    143        Total Cholesterol/HDL:CHD Risk Coronary Heart Disease Risk Table                     Men   Women  1/2 Average Risk   3.4   3.3  Average Risk       5.0   4.4  2 X Average Risk   9.6   7.1  3 X Average Risk  23.4   11.0        Use the calculated Patient Ratio above and the CHD Risk Table to determine the patient's CHD Risk.        ATP III CLASSIFICATION (LDL):  <100     mg/dL   Optimal  244-010  mg/dL   Near or Above                    Optimal  130-159  mg/dL   Borderline  272-536  mg/dL   High  >644     mg/dL   Very High    No BP recorded.  {  Refresh Note OR Click here to enter BP  :1}***    ECG personally reviewed by me today- ***    Echocardiogram 07/10/2023  IMPRESSIONS     1. Known Hypertrophic cardiomyopathy with evidence of LVOT obstruction.  Left ventricular  ejection fraction, by estimation, is 50 to 55%. The left  ventricle has low normal function. The left ventricle has no regional wall  motion abnormalities. There is  severe concentric left ventricular hypertrophy. Left ventricular diastolic  parameters are indeterminate.   2. Right ventricular systolic function is normal. The right ventricular  size is normal.   3. Left atrial size was severely dilated.   4. Right atrial size was moderately dilated.   5. The mitral valve is normal in structure. Trivial mitral valve  regurgitation. No evidence of mitral stenosis.   6. The aortic valve is tricuspid. Aortic valve regurgitation is not  visualized. Aortic valve sclerosis is present, with no evidence of aortic  valve stenosis.   7. There is mild dilatation of the aortic root, measuring 41 mm. There is  moderate dilatation of the ascending aorta, measuring 42 mm.   8. The inferior vena cava is normal in size with greater than 50%  respiratory variability, suggesting right atrial pressure of 3 mmHg.   Comparison(s): No significant change from prior study.   FINDINGS   Left Ventricle: Known Hypertrophic cardiomyopathy with evidence of LVOT  obstruction. Left ventricular ejection fraction, by estimation, is 50 to  55%. The left ventricle has low normal function. The left ventricle has no  regional wall motion  abnormalities. The left ventricular internal cavity size was normal in  size. There is severe concentric left ventricular hypertrophy. Left  ventricular diastolic parameters are indeterminate.   Right Ventricle: The right ventricular size is normal. No increase in  right ventricular wall thickness. Right ventricular systolic function is  normal.   Left Atrium: Left atrial size was severely dilated.   Right Atrium: Right atrial size was moderately dilated.   Pericardium: There is no evidence of pericardial effusion. Presence of  epicardial fat layer.   Mitral Valve: The mitral  valve is normal in structure. Trivial mitral  valve regurgitation. No evidence of mitral valve stenosis.   Tricuspid Valve: The tricuspid valve is normal in structure. Tricuspid  valve regurgitation is mild . No evidence of tricuspid stenosis.   Aortic Valve: The aortic valve is tricuspid. Aortic valve regurgitation is  not visualized. Aortic valve sclerosis is present, with no evidence of  aortic valve stenosis. Aortic valve peak gradient measures 4.7 mmHg.   Pulmonic Valve: The pulmonic valve was normal in structure. Pulmonic valve  regurgitation is trivial. No evidence of pulmonic stenosis.   Aorta: There is mild dilatation of the aortic root, measuring 41 mm. There  is moderate dilatation of the ascending aorta, measuring 42 mm.   Venous: The inferior vena cava is normal in size with greater than 50%  respiratory variability, suggesting right atrial pressure of 3 mmHg.   IAS/Shunts: No atrial level shunt detected by color flow Doppler.       Assessment & Plan   1.  ***   Thomasene Ripple. Ester Hilley NP-C     07/22/2023, 7:29 AM [facility] [address] Office [phone] Fax [phone]    I spent***minutes examining this patient, reviewing medications, and using patient centered shared decision making involving her cardiac care.  Prior to her visit I spent greater than 20 minutes reviewing her  past medical history,  medications, and prior cardiac tests.

## 2023-07-25 ENCOUNTER — Ambulatory Visit: Payer: BLUE CROSS/BLUE SHIELD | Admitting: General Practice

## 2023-08-01 NOTE — Progress Notes (Unsigned)
Cardiology Office Note:   Date:  08/04/2023  ID:  Craig Prince, DOB March 20, 1956, MRN [id number] PCP: Charlane Ferretti, DO  [facility] Providers Cardiologist:  Rollene Rotunda, MD Cardiology APP:  Marcelino Duster, PA {  History of Present Illness:   Craig Prince is a 67 y.o. male who presents for followup of his hypertension and cardiomyopathy. He has a nonischemic cardiomyopathy with an original ejection fraction of 25% to 50% on followup in 2012 and 14. I last saw him in 2019. He has had a CT coronary angiogram with normal coronaries and zero calcium score. Cardiac MRI demonstrated extensive mid wall gadolinium enhancement with septal wall thickening all supporting the diagnosis of hypertrophic cardiomyopathy. An echocardiogram suggested that his EF was about 50%. There was some septal anterior motion of the mitral valve. However, there was no significant gradient. He was seen previously at [facility]. On Echo he had a 42 mm aorta. It looks like he had 20 mm septal thickening with an EF of 65 to 70%. They describe severe septal hypertrophy but no significant gradient or SAM. He had an MRI and had some features that are high risk with increased LGE, NSVT on monitor and 25 mm wall thickness. He did have his genetic testing.  He talked with Dr. Jomarie Longs. In addition he did have an 18 beat run of VT on the monitor. He saw Dr. Graciela Husbands and he was considering ICD placement. Of note his ejection fraction on the MRI was 40%. Echo done yesterday demonstrated that the ejection fraction was 50 to 55% with severe asymmetric left ventricular hypertrophy as previously known.   Since he was last seen in the office he was in the hospital.  He presented to the ED with worsening shortness of breath.  He had new onset atrial fibrillation and acute on chronic heart failure.  His EF was 50 to 55% with severe LVH.  He was diuresed about 12 pounds.  He was sent home on the meds as listed and as needed Lasix.  He did  convert to sinus rhythm spontaneously.  He was placed on anticoagulation.  He did have some AKI that peaked a creatinine of 1.56 from his baseline of 1.15.  He is on optimal medical therapy except for spironolactone exercise breast tenderness.  He is on eplerenone.  He says he has done okay since he was home.  He has not had any of the shortness of breath that he was having.  He does not notice any tachypalpitations.  He said no leg swelling.  He denies chest pressure, neck or arm discomfort.  He has lost a lot of weight which has helped some of his back and knee pain.  He still walks with a cane.  Of note he has been taking his Lasix every day although I thought the instruction looks like as needed at discharge.  ROS: As stated in the HPI and negative for all other systems.  Studies Reviewed:    EKG:   NA  Risk Assessment/Calculations:    CHA2DS2-VASc Score = 3   This indicates a 3.2% annual risk of stroke. The patient's score is based upon: CHF History: 1 HTN History: 1 Diabetes History: 0 Stroke History: 0 Vascular Disease History: 0 Age Score: 1 Gender Score: 0             Physical Exam:   VS:  BP 130/88 (BP Location: Left Arm, Patient Position: Sitting, Cuff Size: Normal)   Pulse 65  Ht 5\' 11"  (1.803 m)   Wt 204 lb 6.4 oz (92.7 kg)   SpO2 100%   BMI 28.51 kg/m    Wt Readings from Last 3 Encounters:  08/04/23 204 lb 6.4 oz (92.7 kg)  07/13/23 209 lb 9.6 oz (95.1 kg)  06/07/23 211 lb 10.3 oz (96 kg)     GEN: Well nourished, well developed in no acute distress NECK: No JVD; No carotid bruits CARDIAC: RRR, no murmurs, rubs, gallops RESPIRATORY:  Clear to auscultation without rales, wheezing or rhonchi  ABDOMEN: Soft, non-tender, non-distended EXTREMITIES:  No edema; No deformity   ASSESSMENT AND PLAN:   Acute on chronic combined systolic and diastolic CHF/hypertrophic cardiomyopathy: He has some high risk features but has not wanted ICD.  He is on optimal medical  therapy.  I will go ahead and leave him on his Lasix daily but check a basic metabolic profile today.  Of note I did send him to a geneticist.  He was not able to afford genetic testing.  He has been counseled that his 7 children are at risk for this condition and they should at least get echocardiograms.    New onset atrial flutter with variable ventricular rate. :  I looked through the rhythm strips and this predominantly was flutter.  There may have been brief run of fibrillation.  He needs to continue anticoagulation with fib/flutter and hypertrophic cardiomyopathy.   I would have a low threshold for ablation if he has recurrent atrial arrhythmias.  I will check a CBC today.    Dilated ascending aorta: This was previously 42 mm.  I will follow this with repeat exams in the future.  Essential hypertension: The blood pressure is managed through management of his hypertrophic cardiomyopathy.  Hypokalemia: This was replaced in the hospital and I will be checking it today.  Acute kidney injury: As above.  His creatinine discharge was 1.48.  His baseline is somewhere between 1.15-1.32.     Follow up APP in 3 months.   Signed, Rollene Rotunda, MD

## 2023-08-04 ENCOUNTER — Encounter: Payer: Self-pay | Admitting: Cardiology

## 2023-08-04 ENCOUNTER — Ambulatory Visit: Payer: BLUE CROSS/BLUE SHIELD | Attending: General Practice | Admitting: Cardiology

## 2023-08-04 VITALS — BP 130/88 | HR 65 | Ht 71.0 in | Wt 204.4 lb

## 2023-08-04 DIAGNOSIS — I5043 Acute on chronic combined systolic (congestive) and diastolic (congestive) heart failure: Secondary | ICD-10-CM | POA: Diagnosis not present

## 2023-08-04 DIAGNOSIS — I480 Paroxysmal atrial fibrillation: Secondary | ICD-10-CM | POA: Diagnosis not present

## 2023-08-04 DIAGNOSIS — I421 Obstructive hypertrophic cardiomyopathy: Secondary | ICD-10-CM

## 2023-08-04 DIAGNOSIS — I10 Essential (primary) hypertension: Secondary | ICD-10-CM

## 2023-08-04 MED ORDER — EPLERENONE 50 MG PO TABS: 50.0000 mg | ORAL_TABLET | Freq: Every day | ORAL | 2 refills | Status: DC

## 2023-08-04 MED ORDER — APIXABAN 5 MG PO TABS: 5.0000 mg | ORAL_TABLET | Freq: Two times a day (BID) | ORAL | 2 refills | Status: DC

## 2023-08-04 NOTE — Patient Instructions (Addendum)
Medication Instructions:   Medication refilled for 3 months  at a time   -- Eliquis , Eplerenone   *If you need a refill on your cardiac medications before your next appointment, please call your pharmacy*   Lab Work:  BMP CBC  If you have labs (blood work) drawn today and your tests are completely normal, you will receive your results only by: MyChart Message (if you have MyChart) OR A paper copy in the mail If you have any lab test that is abnormal or we need to change your treatment, we will call you to review the results.   Testing/Procedures:  Not needed  Follow-Up: At [facility], you and your health needs are our priority.  As part of our continuing mission to provide you with exceptional heart care, we have created designated Provider Care Teams.  These Care Teams include your primary Cardiologist (physician) and Advanced Practice Providers (APPs -  Physician Assistants and Nurse Practitioners) who all work together to provide you with the care you need, when you need it.  We recommend signing up for the patient portal called "MyChart".  Sign up information is provided on this After Visit Summary.  MyChart is used to connect with patients for Virtual Visits (Telemedicine).  Patients are able to view lab/test results, encounter notes, upcoming appointments, etc.  Non-urgent messages can be sent to your provider as well.   To learn more about what you can do with MyChart, go to [email or web address].    Your next appointment:   3 month(s)  The format for your next appointment:   In Person  Provider:   Nelida Gores PA    Other Instructions

## 2023-08-05 ENCOUNTER — Other Ambulatory Visit: Payer: Self-pay | Admitting: *Deleted

## 2023-08-05 DIAGNOSIS — I421 Obstructive hypertrophic cardiomyopathy: Secondary | ICD-10-CM

## 2023-08-05 DIAGNOSIS — I5043 Acute on chronic combined systolic (congestive) and diastolic (congestive) heart failure: Secondary | ICD-10-CM

## 2023-08-05 LAB — CBC
Hematocrit: 46.8 % (ref 37.5–51.0)
Hemoglobin: 13.8 g/dL (ref 13.0–17.7)
MCH: 21.9 pg — ABNORMAL LOW (ref 26.6–33.0)
MCHC: 29.5 g/dL — ABNORMAL LOW (ref 31.5–35.7)
MCV: 74 fL — ABNORMAL LOW (ref 79–97)
Platelets: 188 10*3/uL (ref 150–450)
RBC: 6.3 x10E6/uL — ABNORMAL HIGH (ref 4.14–5.80)
RDW: 18.5 % — ABNORMAL HIGH (ref 11.6–15.4)
WBC: 6.3 10*3/uL (ref 3.4–10.8)

## 2023-08-05 LAB — BASIC METABOLIC PANEL
BUN/Creatinine Ratio: 24 (ref 10–24)
BUN: 41 mg/dL — ABNORMAL HIGH (ref 8–27)
CO2: 22 mmol/L (ref 20–29)
Calcium: 9.5 mg/dL (ref 8.6–10.2)
Chloride: 104 mmol/L (ref 96–106)
Creatinine, Ser: 1.72 mg/dL — ABNORMAL HIGH (ref 0.76–1.27)
Glucose: 88 mg/dL (ref 70–99)
Potassium: 4.2 mmol/L (ref 3.5–5.2)
Sodium: 138 mmol/L (ref 134–144)
eGFR: 43 mL/min/{1.73_m2} — ABNORMAL LOW (ref >59)

## 2023-08-29 ENCOUNTER — Encounter: Payer: Self-pay | Admitting: *Deleted

## 2023-09-09 ENCOUNTER — Other Ambulatory Visit: Payer: Self-pay | Admitting: *Deleted

## 2023-09-09 ENCOUNTER — Telehealth: Payer: Self-pay | Admitting: Cardiology

## 2023-09-09 DIAGNOSIS — I428 Other cardiomyopathies: Secondary | ICD-10-CM

## 2023-09-09 MED ORDER — DAPAGLIFLOZIN PROPANEDIOL 10 MG PO TABS: 10.0000 mg | ORAL_TABLET | Freq: Every day | ORAL | 2 refills | Status: DC

## 2023-09-09 NOTE — Telephone Encounter (Signed)
*  STAT* If patient is at the pharmacy, call can be transferred to refill team.   1. Which medications need to be refilled? (please list name of each medication and dose if known)   dapagliflozin propanediol (FARXIGA) 10 MG TABS tablet    2. Which pharmacy/location (including street and city if local pharmacy) is medication to be sent to?  [id number] - [place],  - [address] AT [facility] OF [address]      3. Do they need a 30 day or 90 day supply? 90 day   Pt is out of this medication

## 2023-10-24 NOTE — Progress Notes (Unsigned)
Cardiology Office Note:    Date:  10/24/2023   ID:  RAMIREZ PRAWL, DOB January 16, 1956, MRN [id number]  PCP:  Charlane Ferretti, DO   [facility] Providers Cardiologist:  Rollene Rotunda, MD Cardiology APP:  Marcelino Duster, PA { Click to update primary MD,subspecialty MD or APP then REFRESH:1}    Referring MD: Charlane Ferretti, DO   No chief complaint on file. ***  History of Present Illness:    Craig Prince is a 67 y.o. male with a hx of hypertrophic nonobstructive cardiomyopathy, nonischemic cardiomyopathy, paroxysmal atrial flutter, hypertension, nonobstructive CAD by heart cath 06/2009.  Ischemic cardiomyopathy with an original ejection fraction 25% on echo in 2012 and 2014.  CT coronary showed normal coronaries and 0 calcium score.  Cardiac MRI demonstrated extensive mid wall gadolinium enhancement with septal wall thickening supporting the diagnosis of hypertrophic cardiomyopathy; suggested that his EF was about 50%.  Septal anterior motion of the mitral valve was noted but no significant gradient.  Severe septal hypertrophy but no significant gradient or SAM.  LVEF 40%.  Due to 18 beat run of VT on heart monitor, EP was consulted for consideration of ICD placement.  He deferred.  Dr. Antoine Poche and I continued to titrate medications OP - he did not tolerate spironolactone due to breast tenderness.  Follow-up echocardiogram 10/2022 showed an LVEF 50-55% with severe asymmetric LVH of the septum up to 22 mm but no LVOT as well as grade 1 DD.   He was hospitalized 07/09/2023 for shortness of breath and hypertension.  He was found to be in atrial flutter with rate 101, RBBB, LAFB.  A-fib/flutter rates are reasonably controlled however increased with movement in the setting of soft BP.  TEE guided cardioversion was planned but he self converted.    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 cath 8/10: prox to mid LAD 20%   Chronic systolic heart failure (HCC)    Essential hypertension, benign    Family history of breast cancer    Family history of prostate cancer    Gout    Gout    Hypertrophic nonobstructive cardiomyopathy (HCC)    septal hypertrophy without LVOT   NICM (nonischemic cardiomyopathy) (HCC)    a. echo 8/10: EF20-25%, mod LAE, severe asymmetric septal hypertrophy (? nonobs. HCM); grade 1 diast dyfxn.  EF 55% echo June 6    Past Surgical History:  Procedure Laterality Date   KNEE ARTHROSCOPY     right    Current Medications: No outpatient medications have been marked as taking for the 11/07/23 encounter (Appointment) with Marcelino Duster, PA.     Allergies:   Spironolactone   Social History   Socioeconomic History   Marital status: Married    Spouse name: Not on file   Number of children: Not on file   Years of education: Not on file   Highest education level: Not on file  Occupational History   Not  on file  Tobacco Use   Smoking status: Never   Smokeless tobacco: Never  Substance and Sexual Activity   Alcohol use: No    Alcohol/week: 0.0 standard drinks of alcohol   Drug use: No   Sexual activity: Not on file  Other Topics Concern   Not on file  Social History Narrative   Not on file   Social Determinants of Health   Financial Resource Strain: Not on file  Food Insecurity: No Food Insecurity (07/09/2023)   Hunger Vital Sign    Worried About Running Out of Food in the Last Year: Never true    Ran Out of Food in the  Last Year: Never true  Transportation Needs: No Transportation Needs (07/09/2023)   PRAPARE - Administrator, Civil Service (Medical): No    Lack of Transportation (Non-Medical): No  Physical Activity: Not on file  Stress: Not on file  Social Connections: Not on file     Family History: The patient's ***family history includes Breast cancer (age of onset: 20) in his sister; Breast cancer (age of onset: 41) in his sister; CAD (age of onset: 65) in his brother; Cancer in his brother and sister; Diabetes in his mother; Healthy in his daughter and son; Heart attack (age of onset: 34) in his mother; Heart disease in his mother; Kidney disease in his brother and father.  ROS:   Please see the history of present illness.    *** All other systems reviewed and are negative.  EKGs/Labs/Other Studies Reviewed:    The following studies were reviewed today: ***      Recent Labs: 07/09/2023: ALT 39; B Natriuretic Peptide 1,638.5; TSH 1.092 07/12/2023: Magnesium 2.2 08/04/2023: BUN 41; Creatinine, Ser 1.72; Hemoglobin 13.8; Platelets 188; Potassium 4.2; Sodium 138  Recent Lipid Panel    Component Value Date/Time   CHOL  07/27/2009 0500    193        ATP III CLASSIFICATION:  <200     mg/dL   Desirable  841-324  mg/dL   Borderline High  >=401    mg/dL   High          TRIG 77 07/27/2009 0500   HDL 35 (L) 07/27/2009 0500   CHOLHDL 5.5 07/27/2009 0500   VLDL 15 07/27/2009 0500   LDLCALC (H) 07/27/2009 0500    143        Total Cholesterol/HDL:CHD Risk Coronary Heart Disease Risk Table                     Men   Women  1/2 Average Risk   3.4   3.3  Average Risk       5.0   4.4  2 X Average Risk   9.6   7.1  3 X Average Risk  23.4   11.0        Use the calculated Patient Ratio above and the CHD Risk Table to determine the patient's CHD Risk.        ATP III CLASSIFICATION (LDL):  <100     mg/dL   Optimal  027-253  mg/dL   Near or Above                    Optimal  130-159   mg/dL   Borderline  664-403  mg/dL   High  >474     mg/dL   Very High     Risk Assessment/Calculations:   {Does this patient  have ATRIAL FIBRILLATION?[phone]}  No BP recorded.  {Refresh Note OR Click here to enter BP  :1}***         Physical Exam:    VS:  There were no vitals taken for this visit.    Wt Readings from Last 3 Encounters:  08/04/23 204 lb 6.4 oz (92.7 kg)  07/13/23 209 lb 9.6 oz (95.1 kg)  06/07/23 211 lb 10.3 oz (96 kg)     GEN: *** Well nourished, well developed in no acute distress HEENT: Normal NECK: No JVD; No carotid bruits LYMPHATICS: No lymphadenopathy CARDIAC: ***RRR, no murmurs, rubs, gallops RESPIRATORY:  Clear to auscultation without rales, wheezing or rhonchi  ABDOMEN: Soft, non-tender, non-distended MUSCULOSKELETAL:  No edema; No deformity  SKIN: Warm and dry NEUROLOGIC:  Alert and oriented x 3 PSYCHIATRIC:  Normal affect   ASSESSMENT:    No diagnosis found. PLAN:    In order of problems listed above:  ***      {Are you ordering a CV Procedure (e.g. stress test, cath, DCCV, TEE, etc)?   Press F2        :[id number]}    Medication Adjustments/Labs and Tests Ordered: Current medicines are reviewed at length with the patient today.  Concerns regarding medicines are outlined above.  No orders of the defined types were placed in this encounter.  No orders of the defined types were placed in this encounter.   There are no Patient Instructions on file for this visit.   Signed, Marcelino Duster, [place]  10/24/2023 7:39 PM    [facility]

## 2023-11-07 ENCOUNTER — Ambulatory Visit: Payer: Self-pay | Attending: Physician Assistant | Admitting: Physician Assistant

## 2023-11-11 ENCOUNTER — Encounter: Payer: Self-pay | Admitting: Genetic Counselor

## 2023-11-11 ENCOUNTER — Telehealth: Payer: Self-pay | Admitting: Genetic Counselor

## 2023-11-11 DIAGNOSIS — Z1589 Genetic susceptibility to other disease: Secondary | ICD-10-CM | POA: Insufficient documentation

## 2023-11-11 NOTE — Telephone Encounter (Signed)
error 

## 2023-12-12 ENCOUNTER — Other Ambulatory Visit: Payer: Self-pay | Admitting: Cardiology

## 2024-01-21 ENCOUNTER — Other Ambulatory Visit: Payer: Self-pay

## 2024-01-21 ENCOUNTER — Emergency Department (HOSPITAL_BASED_OUTPATIENT_CLINIC_OR_DEPARTMENT_OTHER)
Admission: EM | Admit: 2024-01-21 | Discharge: 2024-01-21 | Disposition: A | Discharge status: Home / Self Care | Payer: BLUE CROSS/BLUE SHIELD | Attending: Emergency Medicine | Admitting: Emergency Medicine

## 2024-01-21 ENCOUNTER — Encounter (HOSPITAL_BASED_OUTPATIENT_CLINIC_OR_DEPARTMENT_OTHER): Payer: Self-pay | Admitting: *Deleted

## 2024-01-21 DIAGNOSIS — M109 Gout, unspecified: Secondary | ICD-10-CM | POA: Diagnosis present

## 2024-01-21 MED ORDER — PREDNISONE 50 MG PO TABS
60.0000 mg | ORAL_TABLET | Freq: Once | ORAL | Status: AC
  Administered 2024-01-21: 60 mg via ORAL
  Filled 2024-01-21: qty 1

## 2024-01-21 MED ORDER — PREDNISONE 20 MG PO TABS: 60.0000 mg | ORAL_TABLET | Freq: Every day | ORAL | 0 refills | Status: AC

## 2024-01-21 MED ORDER — OXYCODONE HCL 5 MG PO TABS
5.0000 mg | ORAL_TABLET | Freq: Four times a day (QID) | ORAL | 0 refills | Status: DC | PRN
Start: 2024-01-21 — End: 2024-01-21

## 2024-01-21 MED ORDER — PREDNISONE 20 MG PO TABS: 40.0000 mg | ORAL_TABLET | Freq: Once | ORAL | Status: DC

## 2024-01-21 MED ORDER — ONDANSETRON 4 MG PO TBDP: 4.0000 mg | ORAL_TABLET | Freq: Three times a day (TID) | ORAL | 0 refills | Status: DC | PRN

## 2024-01-21 MED ORDER — OXYCODONE-ACETAMINOPHEN 5-325 MG PO TABS
1.0000 | ORAL_TABLET | Freq: Four times a day (QID) | ORAL | 0 refills | Status: DC | PRN
Start: 2024-01-21 — End: 2024-10-11

## 2024-01-21 NOTE — Discharge Instructions (Addendum)
 Take next dose of steroid tomorrow evening.  Take Roxicodone for pain this medication is sedating so do not mix with alcohol drugs or dangerous activities including driving.  Take Zofran as needed for nausea.  Follow-up with your primary care doctor early this week to make sure things are getting better.  If things worsen please return for evaluation as we discussed.

## 2024-01-21 NOTE — ED Triage Notes (Signed)
 Pt is here for gout in right middle finger.  Finger is swollen and sore.

## 2024-01-21 NOTE — ED Provider Notes (Signed)
 [place] [facility] Provider Note   CSN: [id number] Arrival date & time: 01/21/24  2041     History  Chief Complaint  Patient presents with   Gout    Craig Prince is a 68 y.o. male.  Patient with pain swelling to his right middle finger.  History of gout similar swelling and pain has had it in the spot before.  Has some chronic arthritis from it in the distal portion of his finger as well.  Swollen and sore now for the last few days.  He takes allopurinol.  Denies any fevers or chills.  Denies any trauma.  Denies any drainage.  Denies any chest pain shortness of breath weakness numbness tingling.  The history is provided by the patient.       Home Medications Prior to Admission medications   Medication Sig Start Date End Date Taking? Authorizing Provider  ondansetron (ZOFRAN-ODT) 4 MG disintegrating tablet Take 1 tablet (4 mg total) by mouth every 8 (eight) hours as needed. 01/21/24  Yes Davette Nugent, DO  oxyCODONE (ROXICODONE) 5 MG immediate release tablet Take 1 tablet (5 mg total) by mouth every 6 (six) hours as needed for up to 10 doses. 01/21/24  Yes Miaa Latterell, DO  predniSONE (DELTASONE) 20 MG tablet Take 3 tablets (60 mg total) by mouth daily for 6 days. 01/21/24 01/27/24 Yes Avedis Bevis, DO  acetaminophen (TYLENOL) 325 MG tablet Take 2 tablets (650 mg total) by mouth every 4 (four) hours as needed for headache or mild pain. 12/29/16   Abelino Derrick, PA-C  allopurinol (ZYLOPRIM) 100 MG tablet Take 100 mg by mouth daily. 04/21/21   [provider]  apixaban (ELIQUIS) 5 MG TABS tablet Take 1 tablet (5 mg total) by mouth 2 (two) times daily. 08/04/23   Rollene Rotunda, MD  carvedilol (COREG) 25 MG tablet TAKE 2 TABLETS(50 MG) BY MOUTH TWICE DAILY WITH A MEAL 12/13/23   Rollene Rotunda, MD  dapagliflozin propanediol (FARXIGA) 10 MG TABS tablet Take 1 tablet (10 mg total) by mouth daily. 09/09/23   Rollene Rotunda, MD  eplerenone  (INSPRA) 50 MG tablet Take 1 tablet (50 mg total) by mouth daily. 08/04/23   Rollene Rotunda, MD  furosemide (LASIX) 40 MG tablet Take 1 tablet (40 mg total) by mouth as needed for fluid or edema (For 3 pound weight gain in 24 hours or 5 pounds in 1 week). 07/13/23 07/12/24  Hongalgi, Maximino Greenland, MD  sacubitril-valsartan (ENTRESTO) 97-103 MG Take 1 tablet by mouth 2 (two) times daily. 05/30/23   Rollene Rotunda, MD  colchicine 0.6 MG tablet Take 1 tablet (0.6 mg total) by mouth daily. 09/04/20 02/11/21  Cathren Laine, MD      Allergies    Spironolactone    Review of Systems   Review of Systems  Physical Exam Updated Vital Signs BP (!) 147/107   Pulse 86   Temp 98 F (36.7 C)   Resp 20   SpO2 99%  Physical Exam Vitals and nursing note reviewed.  Constitutional:      General: He is not in acute distress.    Appearance: He is well-developed. He is not ill-appearing.  HENT:     Nose: Nose normal.  Eyes:     Pupils: Pupils are equal, round, and reactive to light.  Cardiovascular:     Rate and Rhythm: Normal rate and regular rhythm.     Pulses: Normal pulses.     Heart sounds: Normal  heart sounds.  Pulmonary:     Effort: Pulmonary effort is normal.     Breath sounds: Normal breath sounds.  Musculoskeletal:     Cervical back: Normal range of motion and neck supple.     Comments: Swelling to the middle finger dorsal DIP on the right good flexion extension there is no swelling to the volar portion of the finger and no fusiform swelling no obvious paronychia or drainage  Skin:    General: Skin is warm and dry.     Capillary Refill: Capillary refill takes less than 2 seconds.  Neurological:     General: No focal deficit present.     Mental Status: He is alert.  Psychiatric:        Mood and Affect: Mood normal.     ED Results / Procedures / Treatments   Labs (all labs ordered are listed, but only abnormal results are displayed) Labs Reviewed - No data to  display  EKG None  Radiology No results found.  Procedures Procedures    Medications Ordered in ED Medications  predniSONE (DELTASONE) tablet 60 mg (60 mg Oral Given 01/21/24 2214)    ED Course/ Medical Decision Making/ A&P                                 Medical Decision Making Risk Prescription drug management.   Craig Prince is here with swelling to the right middle finger at the tip/DIP area.  History of gout similar gout in this area in the past.  He has chronic bone deformity from arthritis.  Does not appear to be a paronychia or tender synovitis or infectious process.  He is got localized swelling to the back of the right middle finger at the DIP.  He has no fever.  Is good good range of motion but somewhat decreased due to arthritis and ultimately I do not think that there is an infectious process going on.  He has had multiple visits for the same he says he always responds very well to steroids and narcotics.  At this time we will pursue prednisone Roxicodone and Zofran.  He is neurovascular neuromuscular intact.  He understands return precautions.  Discharged in good condition.  We discussed at length about return precautions including fever, purulent drainage that he should consider immediate return for infectious workup but at this time clinically appears to be a gout process.  Discharge.  This chart was dictated using voice recognition software.  Despite best efforts to proofread,  errors can occur which can change the documentation meaning.         Final Clinical Impression(s) / ED Diagnoses Final diagnoses:  Acute gout, unspecified cause, unspecified site    Rx / DC Orders ED Discharge Orders          Ordered    predniSONE (DELTASONE) 20 MG tablet  Daily        01/21/24 2227    oxyCODONE (ROXICODONE) 5 MG immediate release tablet  Every 6 hours PRN        01/21/24 2227    ondansetron (ZOFRAN-ODT) 4 MG disintegrating tablet  Every 8 hours PRN         01/21/24 2227              Virgina Norfolk, DO 01/21/24 2243

## 2024-07-09 ENCOUNTER — Encounter (HOSPITAL_BASED_OUTPATIENT_CLINIC_OR_DEPARTMENT_OTHER): Payer: Self-pay | Admitting: Emergency Medicine

## 2024-07-09 ENCOUNTER — Other Ambulatory Visit: Payer: Self-pay

## 2024-07-09 ENCOUNTER — Emergency Department (HOSPITAL_BASED_OUTPATIENT_CLINIC_OR_DEPARTMENT_OTHER)
Admission: EM | Admit: 2024-07-09 | Discharge: 2024-07-09 | Discharge status: Against Medical Advice | Attending: Emergency Medicine | Admitting: Emergency Medicine

## 2024-07-09 ENCOUNTER — Emergency Department (HOSPITAL_BASED_OUTPATIENT_CLINIC_OR_DEPARTMENT_OTHER)

## 2024-07-09 DIAGNOSIS — Z5321 Procedure and treatment not carried out due to patient leaving prior to being seen by health care provider: Secondary | ICD-10-CM | POA: Diagnosis not present

## 2024-07-09 DIAGNOSIS — M109 Gout, unspecified: Secondary | ICD-10-CM | POA: Diagnosis present

## 2024-07-09 NOTE — ED Provider Notes (Signed)
 I went to assess patient. Declined workup, exam. Eloped from the ED  I did not personally assess patient.   Noah Lembke A, PA-C 07/09/24 1801    Elnor Jayson LABOR, DO 07/09/24 2134

## 2024-07-09 NOTE — ED Triage Notes (Signed)
 Pt c/o gout flare up x2 days, in R great toe. Denies fever, known injury

## 2024-07-31 ENCOUNTER — Other Ambulatory Visit: Payer: Self-pay | Admitting: Cardiology

## 2024-08-28 ENCOUNTER — Other Ambulatory Visit: Payer: Self-pay | Admitting: Cardiology

## 2024-08-29 ENCOUNTER — Other Ambulatory Visit: Payer: Self-pay | Admitting: Cardiology

## 2024-09-03 ENCOUNTER — Other Ambulatory Visit: Payer: Self-pay | Admitting: Cardiology

## 2024-09-11 ENCOUNTER — Other Ambulatory Visit: Payer: Self-pay | Admitting: Cardiology

## 2024-10-01 ENCOUNTER — Other Ambulatory Visit: Payer: Self-pay | Admitting: Cardiology

## 2024-10-02 NOTE — Telephone Encounter (Signed)
 Prescription refill request for Eliquis  received. Indication: Last office visit:needs appt Scr: Age:  Weight:  Prescription refilled

## 2024-10-05 ENCOUNTER — Other Ambulatory Visit: Payer: Self-pay | Admitting: Cardiology

## 2024-10-11 ENCOUNTER — Other Ambulatory Visit: Payer: Self-pay

## 2024-10-11 ENCOUNTER — Emergency Department (HOSPITAL_BASED_OUTPATIENT_CLINIC_OR_DEPARTMENT_OTHER)
Admission: EM | Admit: 2024-10-11 | Discharge: 2024-10-11 | Disposition: A | Discharge status: Home / Self Care | Attending: Emergency Medicine | Admitting: Emergency Medicine

## 2024-10-11 ENCOUNTER — Encounter (HOSPITAL_BASED_OUTPATIENT_CLINIC_OR_DEPARTMENT_OTHER): Payer: Self-pay | Admitting: Emergency Medicine

## 2024-10-11 DIAGNOSIS — Z79899 Other long term (current) drug therapy: Secondary | ICD-10-CM | POA: Diagnosis not present

## 2024-10-11 DIAGNOSIS — Z7901 Long term (current) use of anticoagulants: Secondary | ICD-10-CM | POA: Insufficient documentation

## 2024-10-11 DIAGNOSIS — M79645 Pain in left finger(s): Secondary | ICD-10-CM | POA: Diagnosis present

## 2024-10-11 DIAGNOSIS — M109 Gout, unspecified: Secondary | ICD-10-CM | POA: Insufficient documentation

## 2024-10-11 DIAGNOSIS — I10 Essential (primary) hypertension: Secondary | ICD-10-CM | POA: Diagnosis not present

## 2024-10-11 MED ORDER — PREDNISONE 10 MG (21) PO TBPK: ORAL_TABLET | Freq: Every day | ORAL | 0 refills | Status: AC

## 2024-10-11 MED ORDER — ONDANSETRON 4 MG PO TBDP: 4.0000 mg | ORAL_TABLET | Freq: Four times a day (QID) | ORAL | 0 refills | Status: AC | PRN

## 2024-10-11 MED ORDER — OXYCODONE-ACETAMINOPHEN 5-325 MG PO TABS
1.0000 | ORAL_TABLET | Freq: Four times a day (QID) | ORAL | 0 refills | Status: AC | PRN
Start: 2024-10-11 — End: ?

## 2024-10-11 NOTE — ED Triage Notes (Signed)
 Pt reports 5-6 day hx of gout pain to left hand.  Takes meds at home for same.

## 2024-10-11 NOTE — Discharge Instructions (Addendum)
 Please use Tylenol  or ibuprofen for pain.  You may use 600 mg ibuprofen every 6 hours or 1000 mg of Tylenol  every 6 hours.  You may choose to alternate between the 2.  This would be most effective.  Not to exceed 4 g of Tylenol  within 24 hours.  Not to exceed 3200 mg ibuprofen 24 hours.  You can use the stronger narcotic pain medication in place of Tylenol  for severe break through pain.  If you take the narcotic pain medication that we prescribed recommend that you also take a laxative such as MiraLAX or Dulcolax every day that you take the narcotic pain medicine, and drink plenty of fluids, 50 to 64 ounces to prevent any constipation.  You can use nausea medication to every 6 hours as needed.

## 2024-10-11 NOTE — ED Provider Notes (Signed)
 [place] [facility] Provider Note   CSN: [id number] Arrival date & time: 10/11/24  1724     Patient presents with: Gout   Craig Prince is a 68 y.o. male with past medical history seen for hypertension, obesity, history of gout who presents with concern for 5 to 6-day history of gout pain to left hand.  Most focally in the left little finger.  History of same.  He reports normally he has some relief with prednisone  taper.  He has already tried colchicine  at home without significant relief.  He rates the pain 9/10.  He reports he also has some relief with narcotic pain medication but that it does make him quite nauseous and requests nausea medication with that if we prescribe any stronger pain medicine.  He denies any other areas of swelling, pain.  He denies any injury to the affected hand.  He denies any history of cellulitis, pus draining from the affected site.   HPI     Prior to Admission medications   Medication Sig Start Date End Date Taking? Authorizing Provider  ondansetron  (ZOFRAN -ODT) 4 MG disintegrating tablet Take 1 tablet (4 mg total) by mouth every 6 (six) hours as needed for nausea or vomiting. 10/11/24  Yes Jerusalem Brownstein H, PA-C  oxyCODONE -acetaminophen  (PERCOCET/ROXICET) 5-325 MG tablet Take 1 tablet by mouth every 6 (six) hours as needed for severe pain (pain score 7-10). 10/11/24  Yes Litsy Epting H, PA-C  predniSONE  (STERAPRED UNI-PAK 21 TAB) 10 MG (21) TBPK tablet Take by mouth daily. Take 6 tabs by mouth daily  for 2 days, then 5 tabs for 2 days, then 4 tabs for 2 days, then 3 tabs for 2 days, 2 tabs for 2 days, then 1 tab by mouth daily for 2 days 10/11/24  Yes Londan Coplen H, PA-C  acetaminophen  (TYLENOL ) 325 MG tablet Take 2 tablets (650 mg total) by mouth every 4 (four) hours as needed for headache or mild pain. 12/29/16   Kilroy, Luke K, PA-C  allopurinol (ZYLOPRIM) 100 MG tablet Take 100 mg by mouth daily.  04/21/21   [provider]  apixaban  (ELIQUIS ) 5 MG TABS tablet Take 1 tablet (5 mg total) by mouth 2 (two) times daily. NEEDS CARDIOLOGY APPT FOR REFILLS, CALL OFFICE [phone].  THANK YOU 10/02/24   Lavona Agent, MD  carvedilol  (COREG ) 25 MG tablet TAKE 2 TABLETS(50 MG) BY MOUTH TWICE DAILY WITH A MEAL 12/13/23   Lavona Agent, MD  dapagliflozin  propanediol (FARXIGA ) 10 MG TABS tablet Take 1 tablet (10 mg total) by mouth daily. 09/09/23   Lavona Agent, MD  eplerenone  (INSPRA ) 50 MG tablet Take 1 tablet (50 mg total) by mouth daily. 10/08/24   Lavona Agent, MD  furosemide  (LASIX ) 40 MG tablet Take 1 tablet (40 mg total) by mouth as needed for fluid or edema (For 3 pound weight gain in 24 hours or 5 pounds in 1 week). 07/13/23 07/12/24  Hongalgi, Anand D, MD  sacubitril -valsartan  (ENTRESTO ) 97-103 MG TAKE 1 TABLET BY MOUTH TWICE DAILY 09/13/24   Lavona Agent, MD  colchicine  0.6 MG tablet Take 1 tablet (0.6 mg total) by mouth daily. 09/04/20 02/11/21  Steinl, Kevin, MD    Allergies: Spironolactone     Review of Systems  All other systems reviewed and are negative.   Updated Vital Signs BP (!) 135/98 (BP Location: Right Arm)   Pulse 79   Temp 98.2 F (36.8 C) (Oral)   Resp 18   SpO2  97%   Physical Exam Vitals and nursing note reviewed.  Constitutional:      General: He is not in acute distress.    Appearance: Normal appearance.  HENT:     Head: Normocephalic and atraumatic.  Eyes:     General:        Right eye: No discharge.        Left eye: No discharge.  Cardiovascular:     Rate and Rhythm: Normal rate and regular rhythm.     Pulses: Normal pulses.     Comments: Radial, ulnar pulses are 2+ in the affected left upper extremity Pulmonary:     Effort: Pulmonary effort is normal. No respiratory distress.  Musculoskeletal:        General: Swelling present. No deformity.     Comments: Significant redness, warmth, soft tissue swelling left lateral hand, most  focally at DIP of 5th digit. Clinically consistent with acute gout flare  Skin:    General: Skin is warm and dry.     Capillary Refill: Capillary refill takes less than 2 seconds.  Neurological:     Mental Status: He is alert and oriented to person, place, and time.  Psychiatric:        Mood and Affect: Mood normal.        Behavior: Behavior normal.     (all labs ordered are listed, but only abnormal results are displayed) Labs Reviewed - No data to display  EKG: None  Radiology: No results found.   Procedures   Medications Ordered in the ED - No data to display                                  Medical Decision Making  This patient is a 68 y.o. male who presents to the ED for concern of Gout flare in left hand.   Differential diagnoses prior to evaluation: Arthritis, cellulitis, septic arthritis, abscess, gout versus other  Past Medical History / Social History / Additional history: Chart reviewed. Pertinent results include: hypertension, obesity, history of gout  Physical Exam: Physical exam performed. The pertinent findings include: Swelling present. No deformity.     Comments: Significant redness, warmth, soft tissue swelling left lateral hand, most focally at DIP of 5th digit. Clinically consistent with acute gout flare  Radial, ulnar pulses are 2+ in the affected left upper extremity  Medications / Treatment: Patient taking his home medications appropriately, do seem consistent with gout flare plan for discharge with prednisone  taper, and short narcotic pain prescription.  Encourage close PCP follow-up.   Disposition: After consideration of the diagnostic results and the patients response to treatment, I feel that patient's symptoms are consistent with acute gout flare, treated as above.   emergency department workup does not suggest an emergent condition requiring admission or immediate intervention beyond what has been performed at this time. The plan is: as  above. The patient is safe for discharge and has been instructed to return immediately for worsening symptoms, change in symptoms or any other concerns.   Final diagnoses:  Acute gout of left hand, unspecified cause    ED Discharge Orders          Ordered    oxyCODONE -acetaminophen  (PERCOCET/ROXICET) 5-325 MG tablet  Every 6 hours PRN        10/11/24 1812    ondansetron  (ZOFRAN -ODT) 4 MG disintegrating tablet  Every 6 hours PRN  10/11/24 1812    predniSONE  (STERAPRED UNI-PAK 21 TAB) 10 MG (21) TBPK tablet  Daily        10/11/24 1812               Rosan Sherlean DEL, PA-C 10/11/24 TRENNA Lenor Hollering, MD 10/11/24 [phone]

## 2024-10-29 ENCOUNTER — Other Ambulatory Visit: Payer: Self-pay | Admitting: Cardiology

## 2024-10-29 NOTE — Telephone Encounter (Signed)
 Prescription refill request for Eliquis  received. Indication: Last office visit:NEEDS APPT Scr:  Age: Weight: PRESCRIPTION REFILLED

## 2024-11-23 ENCOUNTER — Telehealth: Payer: Self-pay | Admitting: Cardiology

## 2024-11-23 MED ORDER — EPLERENONE 50 MG PO TABS: 50.0000 mg | ORAL_TABLET | Freq: Every day | ORAL | 0 refills | Status: AC

## 2024-11-23 MED ORDER — SACUBITRIL-VALSARTAN 97-103 MG PO TABS: 1.0000 | ORAL_TABLET | Freq: Two times a day (BID) | ORAL | 0 refills | Status: DC

## 2024-11-23 NOTE — Telephone Encounter (Signed)
" °*  STAT* If patient is at the pharmacy, call can be transferred to refill team.   1. Which medications need to be refilled? (please list name of each medication and dose if known)   eplerenone  (INSPRA ) 50 MG tablet   sacubitril -valsartan  (ENTRESTO ) 97-103 MG   2. Would you like to learn more about the convenience, safety, & potential cost savings by using the [facility]?   3. Are you open to using the [facility] (Type [facility].    4. Which pharmacy/location (including street and city if local pharmacy) is medication to be sent to?  [id number] - [place], [place] - [address] AT [facility] OF [address]     5. Do they need a 30 day or 90 day supply? Please refill to appt 01/02/25   Pt only has 2 pills left "

## 2024-11-23 NOTE — Telephone Encounter (Signed)
 Sent in refill

## 2025-01-02 ENCOUNTER — Encounter: Payer: Self-pay | Admitting: Emergency Medicine

## 2025-01-02 ENCOUNTER — Ambulatory Visit: Payer: Self-pay | Admitting: Emergency Medicine

## 2025-01-02 VITALS — BP 136/80 | HR 70 | Ht 70.0 in | Wt 216.2 lb

## 2025-01-02 DIAGNOSIS — I10 Essential (primary) hypertension: Secondary | ICD-10-CM

## 2025-01-02 DIAGNOSIS — N1832 Chronic kidney disease, stage 3b: Secondary | ICD-10-CM

## 2025-01-02 DIAGNOSIS — I480 Paroxysmal atrial fibrillation: Secondary | ICD-10-CM

## 2025-01-02 DIAGNOSIS — I422 Other hypertrophic cardiomyopathy: Secondary | ICD-10-CM

## 2025-01-02 DIAGNOSIS — I7121 Aneurysm of the ascending aorta, without rupture: Secondary | ICD-10-CM

## 2025-01-02 DIAGNOSIS — I5042 Chronic combined systolic (congestive) and diastolic (congestive) heart failure: Secondary | ICD-10-CM

## 2025-01-02 LAB — CBC

## 2025-01-02 MED ORDER — CARVEDILOL 25 MG PO TABS: 25.0000 mg | ORAL_TABLET | Freq: Two times a day (BID) | ORAL | 3 refills | Status: AC

## 2025-01-02 MED ORDER — FUROSEMIDE 40 MG PO TABS: 40.0000 mg | ORAL_TABLET | ORAL | 1 refills | Status: AC | PRN

## 2025-01-02 MED ORDER — SACUBITRIL-VALSARTAN 97-103 MG PO TABS: 1.0000 | ORAL_TABLET | Freq: Two times a day (BID) | ORAL | 3 refills | Status: AC

## 2025-01-02 MED ORDER — APIXABAN 5 MG PO TABS: 5.0000 mg | ORAL_TABLET | Freq: Two times a day (BID) | ORAL | 3 refills | Status: AC

## 2025-01-02 NOTE — Patient Instructions (Addendum)
 Medication Instructions:  The Farxiga  has been removed from your medication list.  *If you need a refill on your cardiac medications before your next appointment, please call your pharmacy*  Lab Work: BMET, CBC If you have labs (blood work) drawn today and your tests are completely normal, you will receive your results only by: MyChart Message (if you have MyChart) OR A paper copy in the mail If you have any lab test that is abnormal or we need to change your treatment, we will call you to review the results.  Testing/Procedures: Your physician has requested that you have an echocardiogram. Echocardiography is a painless test that uses sound waves to create images of your heart. It provides your doctor with information about the size and shape of your heart and how well your hearts chambers and valves are working. This procedure takes approximately one hour. There are no restrictions for this procedure.   Please do NOT wear cologne, perfume, aftershave, or lotions (deodorant is allowed).   Please arrive 15 minutes prior to your appointment time.   Follow-Up: At [facility], you and your health needs are our priority.  As part of our continuing mission to provide you with exceptional heart care, our providers are all part of one team.  This team includes your primary Cardiologist (physician) and Advanced Practice Providers or APPs (Physician Assistants and Nurse Practitioners) who all work together to provide you with the care you need, when you need it.  Your next appointment:  A letter will be mailed to you as a reminder to call the office for your next follow up appointment.   6 month(s)  Provider:   Lynwood Schilling, MD    We recommend signing up for the patient portal called MyChart.  Sign up information is provided on this After Visit Summary.  MyChart is used to connect with patients for Virtual Visits (Telemedicine).  Patients are able to view lab/test results,  encounter notes, upcoming appointments, etc.  Non-urgent messages can be sent to your provider as well.   To learn more about what you can do with MyChart, go to [email or web address].   Other Instructions

## 2025-01-02 NOTE — Progress Notes (Signed)
 " Cardiology Office Note:    Date:  01/02/2025  ID:  Craig Prince, DOB 02/14/1956, MRN [id number] PCP: Valentin Skates, DO  [facility] Providers Cardiologist:  Lynwood Schilling, MD Cardiology APP:  Madie Jon Garre, PA       Patient Profile:       Chief Complaint: Follow-up for systolic and diastolic heart failure History of Present Illness:  Craig Prince is a 69 y.o. male with visit-pertinent history of chronic combined systolic and diastolic heart failure, hypertrophic cardiomyopathy, atrial flutter, dilated ascending aorta, hypertension  He has history of nonischemic myopathy with an original ejection fraction of 25% to 50% on follow-up in 2012 and 2014.  He underwent coronary CTA 11/2016 showing coronary calcium score of 0.  Cardiac MRI 07/2018 showed severe asymmetric septal hypertrophy, EF 45%, delayed enhancement pattern with extensive mid wall LGE in the hypertrophied septum most consistent with hypertrophic cardiomyopathy, mildly dilated ascending aorta.  Zio 12/2021 showed average heart rate 70 bpm, 1 run of VT lasting 18 beats with max rate of 171 bpm, 3 runs of SVT with the longest being 9 beats.  Cardiac MRI 02/2022 showed severe asymmetric septal hypertrophy with intraventricular septal thickness of 22 mm, posterior wall thickness of 7 mm, LVEF 40%, study suggestive of hypertrophic cardiomyopathy with no LVOT obstructive demonstrated in the study.  Most recent echo on 06/2023 showed LVEF 50 to 55%, no RWMA, severe LVH, RV function size normal, left atrial size severely dilated, right atrial size mildly dilated, trivial MR, mild dilation of aortic root measuring 41 mm, mild dilation of ascending aorta measuring 42 mm.  Last seen in clinic on 08/04/2023 by Dr. Schilling.  He was doing well at that time.  No medication changes were made.   Discussed the use of AI scribe software for clinical note transcription with the patient, who gave verbal consent to proceed.  History of Present  Illness Craig Prince is a 69 year old male with heart failure and hypertrophic cardiomyopathy who presents for medication refills and follow-up.  He has not seen his cardiologist since September 2024 after a missed appointment related to moving. He previously took Lasix  but has not used it recently. He currently takes Entresto  twice daily, eplerenone  once daily, carvedilol  twice daily, and Eliquis  twice daily. He is not taking Farxiga , and reports he has not taken this in well over a year.  He has atrial flutter and takes Eliquis  for anticoagulation without problems obtaining or taking it.  He denies any tachypalpitations.  He denies symptoms of atrial fibrillation.  He denies chest pain, dyspnea, PND, LEE, weight gain, edema, or orthopnea.  Review of systems:  Please see the history of present illness. All other systems are reviewed and otherwise negative.      Studies Reviewed:    EKG Interpretation Date/Time:  Wednesday January 02 2025 07:56:29 EST Ventricular Rate:  66 PR Interval:  214 QRS Duration:  136 QT Interval:  436 QTC Calculation: 457 R Axis:   -66  Text Interpretation: Sinus rhythm with 1st degree A-V block with occasional Premature ventricular complexes and Premature atrial complexes Left axis deviation Non-specific intra-ventricular conduction block Minimal voltage criteria for LVH, may be normal variant ( Cornell product ) T wave abnormality, consider lateral ischemia When compared with ECG of 13-Jul-2023 07:34, Premature ventricular complexes are now Present Premature atrial complexes are now Present Confirmed by Rana Dixon [phone] on 01/02/2025 8:35:12 AM    Echocardiogram 07/10/2023 1. Known Hypertrophic cardiomyopathy with  evidence of LVOT obstruction.  Left ventricular ejection fraction, by estimation, is 50 to 55%. The left  ventricle has low normal function. The left ventricle has no regional wall  motion abnormalities. There is  severe concentric left  ventricular hypertrophy. Left ventricular diastolic  parameters are indeterminate.   2. Right ventricular systolic function is normal. The right ventricular  size is normal.   3. Left atrial size was severely dilated.   4. Right atrial size was moderately dilated.   5. The mitral valve is normal in structure. Trivial mitral valve  regurgitation. No evidence of mitral stenosis.   6. The aortic valve is tricuspid. Aortic valve regurgitation is not  visualized. Aortic valve sclerosis is present, with no evidence of aortic  valve stenosis.   7. There is mild dilatation of the aortic root, measuring 41 mm. There is  moderate dilatation of the ascending aorta, measuring 42 mm.   8. The inferior vena cava is normal in size with greater than 50%  respiratory variability, suggesting right atrial pressure of 3 mmHg.   Echocardiogram 11/03/2022  1. Severe asymmetric LVH of the septum up to 22 mm consistent with known  history of hypertrophic cardiomyopathy. No LVOT obstruction. Left  ventricular ejection fraction, by estimation, is 50 to 55%. The left  ventricle has low normal function. The left  ventricle demonstrates global hypokinesis. There is severe asymmetric left  ventricular hypertrophy of the septal segment. Left ventricular diastolic  parameters are consistent with Grade I diastolic dysfunction (impaired  relaxation). The average left  ventricular global longitudinal strain is -12.6 %. The global longitudinal  strain is abnormal.   2. Right ventricular systolic function is normal. The right ventricular  size is normal. There is normal pulmonary artery systolic pressure. The  estimated right ventricular systolic pressure is 30.1 mmHg.   3. Left atrial size was severely dilated.   4. Right atrial size was mild to moderately dilated.   5. The mitral valve is grossly normal. No evidence of mitral valve  regurgitation. No evidence of mitral stenosis.   6. The aortic valve is tricuspid.  Aortic valve regurgitation is not  visualized. No aortic stenosis is present.   7. Aortic dilatation noted. There is mild dilatation of the aortic root,  measuring 40 mm. There is moderate dilatation of the ascending aorta,  measuring 42 mm.   8. The inferior vena cava is dilated in size with >50% respiratory  variability, suggesting right atrial pressure of 8 mmHg.   Cardiac MRI 03/10/2022 Study suggestive of hypertrophic cardiomyopathy. No LVOT obstructive demonstrated in this study.   LVEF 40%, with septal thickness of 22 mm and LGE quantification of 23.5%.   Compared to prior, LVEF is slightly decrease and septal thickness is slightly increased. LGE is extensive.  Zio 01/04/2022 Had a min HR of 46 bpm, max HR of 179 bpm, and avg HR of 70 bpm. Predominant underlying rhythm was Sinus Rhythm. First Degree AV Block was present. Bundle Branch Block/IVCD was present. 1 run of Ventricular Tachycardia occurred lasting 18 beats with a max rate of 171 bpm (avg 145 bpm). 3 Supraventricular Tachycardia runs occurred, the run with the fastest interval lasting 4 beats with a max rate of 179 bpm, the longest lasting 9 beats with an avg rate of 121 bpm. Isolated SVEs were rare (<1.0%), SVE Couplets were rare (<1.0%), and SVE Triplets were rare (<1.0%). Isolated VEs were occasional (2.8%, 9747), VE Couplets were rare (<1.0%, 136), and no VE Triplets  were present. Ventricular Bigeminy and Trigeminy were present.   Normal sinus rhythm NSVT lasting 18 beats.   Three runs of SVT with the longest being 9 beats.   Risk Assessment/Calculations:    CHA2DS2-VASc Score = 3   This indicates a 3.2% annual risk of stroke. The patient's score is based upon: CHF History: 1 HTN History: 1 Diabetes History: 0 Stroke History: 0 Vascular Disease History: 0 Age Score: 1 Gender Score: 0             Physical Exam:   VS:  BP 136/80   Pulse 70   Ht 5' 10 (1.778 m)   Wt 216 lb 3.2 oz (98.1 kg)   SpO2 97%    BMI 31.02 kg/m    Wt Readings from Last 3 Encounters:  01/02/25 216 lb 3.2 oz (98.1 kg)  07/09/24 205 lb (93 kg)  08/04/23 204 lb 6.4 oz (92.7 kg)    GEN: Well nourished, well developed in no acute distress NECK: No JVD; No carotid bruits CARDIAC: RRR, no murmurs, rubs, gallops RESPIRATORY:  Clear to auscultation without rales, wheezing or rhonchi  ABDOMEN: Soft, non-tender, non-distended EXTREMITIES:  No edema; No acute deformity      Assessment and Plan:  Hypertrophic cardiomyopathy Systolic and diastolic heart failure  He has history of NICM with EF as low as 25% CTA in 2018 showed CAC score of 0 cMRI 02/2022 with LVEF 40% with study suggestive of hypertrophic cardiomyopathy and no LVOT obstruction Zio 12/2021 showed NSVT lasting 18 beats Most recent echo 06/2023 shows recovered LVEF of 50 to 55%, no RWMA, severe LVH, RV normal, evidence of LVOT obstruction - Of note, he has been seen by EP in the past and ICD was offered given high risk features on cardiac MRI and one run of NSVT on prior monitor but patient declined.  He continues to defer ICD today and does not want to follow-up with EP.  We discussed the reasoning for ICD and he verbalized understanding - NYHA class II.  Currently without dyspnea, orthopnea, PND, weight gain, LEE - We discussed further titration of his GDMT.  He has not taken Farxiga  well over a year.  I suggested adding SGLT2i back to regimen but he politely deferred - Experienced breast tenderness on spironolactone  - Continue current GDMT, no change in therapy - Continue carvedilol  50 mg twice daily, eplerenone  50 mg daily, Entresto  97-103 mg twice daily, and furosemide  40 mg as needed - Plan for repeat echocardiogram  Atrial fibrillation Diagnosed in 2024 and spontaneously converted to NSR at that time - He is maintaining normal sinus rhythm on EKG today with PACs and PVCs - He is without symptoms concerning for recurrent atrial flutter and denies any  tachypalpitations - Continue Eliquis  5 mg twice daily - Continue carvedilol  50 mg twice daily  Hypertension Blood pressure today is controlled at 136/80 - Continue HF GDMT  Ascending aorta dilation Echo 06/2023 shows mild dilation of the aortic root measuring 41 mm and moderate dilation of the ascending aorta measuring 42 mm - Plan for echocardiogram for routine monitoring  CKD stage IIIb Creatinine 1.72 and GFR 43 on 07/2023 - Plan for repeat BMET today      Dispo:  Return in about 6 months (around 07/02/2025).  Signed, Lum LITTIE Louis, NP  "

## 2025-01-03 ENCOUNTER — Ambulatory Visit: Payer: Self-pay | Admitting: Emergency Medicine

## 2025-01-03 LAB — CBC
Hematocrit: 48.4 (ref 37.5–51.0)
Hemoglobin: 14.3 g/dL (ref 13.0–17.7)
MCH: 22.8 pg — AB (ref 26.6–33.0)
MCHC: 29.5 g/dL — AB (ref 31.5–35.7)
MCV: 77 fL — AB (ref 79–97)
Platelets: 236 10*3/uL (ref 150–450)
RBC: 6.27 x10E6/uL — AB (ref 4.14–5.80)
RDW: 15.9 — AB (ref 11.6–15.4)
WBC: 7.5 10*3/uL (ref 3.4–10.8)

## 2025-01-03 LAB — BASIC METABOLIC PANEL WITH GFR
BUN/Creatinine Ratio: 19 (ref 10–24)
BUN: 27 mg/dL (ref 8–27)
CO2: 21 mmol/L (ref 20–29)
Calcium: 9.4 mg/dL (ref 8.6–10.2)
Chloride: 105 mmol/L (ref 96–106)
Creatinine, Ser: 1.43 mg/dL — ABNORMAL HIGH (ref 0.76–1.27)
Glucose: 98 mg/dL (ref 70–99)
Potassium: 4.9 mmol/L (ref 3.5–5.2)
Sodium: 141 mmol/L (ref 134–144)
eGFR: 53 mL/min/{1.73_m2} — ABNORMAL LOW

## 2025-02-12 ENCOUNTER — Ambulatory Visit (HOSPITAL_COMMUNITY): Payer: Self-pay
